# Patient Record
Sex: Female | Born: 1955 | Race: Black or African American | Hispanic: No | Marital: Single | Smoking: Current every day smoker
Health system: Southern US, Community
[De-identification: ages and names within clinical notes are randomized; demographics above are authoritative.]

## PROBLEM LIST (undated history)

## (undated) DIAGNOSIS — M549 Dorsalgia, unspecified: Secondary | ICD-10-CM

## (undated) DIAGNOSIS — M1990 Unspecified osteoarthritis, unspecified site: Secondary | ICD-10-CM

## (undated) DIAGNOSIS — Z9189 Other specified personal risk factors, not elsewhere classified: Secondary | ICD-10-CM

## (undated) DIAGNOSIS — E785 Hyperlipidemia, unspecified: Secondary | ICD-10-CM

## (undated) DIAGNOSIS — G4730 Sleep apnea, unspecified: Secondary | ICD-10-CM

## (undated) DIAGNOSIS — D649 Anemia, unspecified: Secondary | ICD-10-CM

## (undated) DIAGNOSIS — J45909 Unspecified asthma, uncomplicated: Secondary | ICD-10-CM

## (undated) DIAGNOSIS — R51 Headache: Secondary | ICD-10-CM

## (undated) DIAGNOSIS — M797 Fibromyalgia: Secondary | ICD-10-CM

## (undated) DIAGNOSIS — I10 Essential (primary) hypertension: Secondary | ICD-10-CM

## (undated) DIAGNOSIS — R519 Headache, unspecified: Secondary | ICD-10-CM

## (undated) DIAGNOSIS — N390 Urinary tract infection, site not specified: Secondary | ICD-10-CM

## (undated) HISTORY — DX: Unspecified osteoarthritis, unspecified site: M19.90

## (undated) HISTORY — PX: DILATION AND CURETTAGE OF UTERUS: SHX78

## (undated) HISTORY — DX: Fibromyalgia: M79.7

## (undated) HISTORY — PX: COLON SURGERY: SHX602

## (undated) HISTORY — DX: Sleep apnea, unspecified: G47.30

## (undated) HISTORY — PX: CHOLECYSTECTOMY: SHX55

## (undated) HISTORY — DX: Urinary tract infection, site not specified: N39.0

## (undated) HISTORY — DX: Essential (primary) hypertension: I10

## (undated) HISTORY — PX: OVARIAN CYST REMOVAL: SHX89

## (undated) HISTORY — DX: Anemia, unspecified: D64.9

## (undated) HISTORY — DX: Dorsalgia, unspecified: M54.9

---

## 2011-01-29 ENCOUNTER — Ambulatory Visit (INDEPENDENT_AMBULATORY_CARE_PROVIDER_SITE_OTHER): Payer: Self-pay | Admitting: Family Medicine

## 2011-01-29 ENCOUNTER — Encounter: Payer: Self-pay | Admitting: Family Medicine

## 2011-01-29 DIAGNOSIS — E669 Obesity, unspecified: Secondary | ICD-10-CM

## 2011-01-29 DIAGNOSIS — M549 Dorsalgia, unspecified: Secondary | ICD-10-CM

## 2011-01-29 DIAGNOSIS — Z720 Tobacco use: Secondary | ICD-10-CM

## 2011-01-29 DIAGNOSIS — F17200 Nicotine dependence, unspecified, uncomplicated: Secondary | ICD-10-CM

## 2011-01-29 DIAGNOSIS — G479 Sleep disorder, unspecified: Secondary | ICD-10-CM

## 2011-01-29 NOTE — Patient Instructions (Signed)
It was great to see you today! I want you to get some tylenol (also known as acetaminophen) 650mg  tablets.  Take one of them every 6 hours around the clock from now until when I see you next. Get with Rudell Cobb and get yourself approved so we can manage some tests without you ending up with big bills.  Get back to see me after you get approved.

## 2011-02-08 ENCOUNTER — Encounter: Payer: Self-pay | Admitting: Family Medicine

## 2011-02-08 DIAGNOSIS — M549 Dorsalgia, unspecified: Secondary | ICD-10-CM | POA: Insufficient documentation

## 2011-02-08 DIAGNOSIS — G479 Sleep disorder, unspecified: Secondary | ICD-10-CM | POA: Insufficient documentation

## 2011-02-08 DIAGNOSIS — Z720 Tobacco use: Secondary | ICD-10-CM | POA: Insufficient documentation

## 2011-02-08 DIAGNOSIS — E669 Obesity, unspecified: Secondary | ICD-10-CM | POA: Insufficient documentation

## 2011-02-08 NOTE — Assessment & Plan Note (Signed)
I am somewhat unclear as to the exact etiology of this patient's sleep problem. Whenever it was brought up, the conversation quickly turned to problems with her pain or eating. I am not inclined to begin any medication for this issue as I feel that it is most likely environmental. We will have to discuss this at further visits.

## 2011-02-08 NOTE — Progress Notes (Signed)
Patient name: Dawn Lucas Medical record number: [id number] Date of birth: [birth year] Age: 55 y.o. Gender: female  Primary Care Provider: Majel Homer, MD  Chief Complaint: Back and leg pain. History of Present Illness: Dawn Lucas is a 55 y.o. year old female presenting with Multiple complaints.  1: the patient reports having back pain that has been present for many years. It has gradually worsened with time, and now significantly interferes with her ability to function. She reports that the pain is constant, aggravated by standing, also aggravated by sitting for too long. She has not tried many medications for her pain as she does not like Tylenol, and says that Motrin makes her sick. She has tried a friends Flexeril and reports that it makes her crazy. She has also tried a friend Xanax, which has helped her sleep but has not significantly helped the pain. She says that the pain does radiate to her legs somewhat, but has not been associated with any numbness or tingling, and there is no history of bowel or bladder dysfunction.  The patient reports having attempted to apply for disability in a previous state, but was turned down. She moved to [place] approximately one year ago and hopes to apply for disability here as well.  2: additionally, the patient reports significant problems with sleep. Her problems with sleep appear to be intertwined with her pain, however she demonstrates poor sleep hygiene, and a poor sleep pattern. She typically sleeps when tired during the day, and has a hard time going to sleep at night. She has tried Xanax and Tylenol p.m. For her sleep, and feels that the Xanax is helpful. She has not been prescribed Xanax before, but rather has used a friends.  3: finally, the patient admits to having a problem with her weight. She reports that she has been unable to exercise over the past year or two secondary to the pain in her back and legs.  Patient Active Problem List    Diagnoses  . Back pain  . Obese  . Tobacco abuse   Past Medical History: Past Medical History  Diagnosis Date  . Anemia   . UTI (lower urinary tract infection)   . Back pain     Past Surgical History: Past Surgical History  Procedure Date  . Cholecystectomy   . Ovarian cyst removal     Social History: History   Social History  . Marital Status: Single    Spouse Name: N/A    Number of Children: N/A  . Years of Education: N/A   Social History Main Topics  . Smoking status: Current Everyday Smoker -- 0.2 packs/day for 45 years    Types: Cigarettes  . Smokeless tobacco: None  . Alcohol Use: Yes  . Drug Use: No  . Sexually Active: Not Currently   Other Topics Concern  . None   Social History Narrative  . None    Family History: History reviewed. No pertinent family history.  Allergies: Allergies  Allergen Reactions  . Quinolones   . Sulfa Antibiotics     No current outpatient prescriptions on file.   Review Of Systems: Per HPI Otherwise 12 point review of systems was performed and was unremarkable.  Physical Exam: Filed Vitals:   01/29/11 1422  BP: 158/98  Pulse: 87  Temp: 98.5 F (36.9 C)              General: alert, cooperative and moderately obese HEENT: extra ocular movement intact and sclera clear, anicteric Heart:  S1, S2 normal, no murmur, rub or gallop, regular rate and rhythm Lungs: clear to auscultation, no wheezes or rales and unlabored breathing Abdomen: abdomen is soft without significant tenderness, masses, organomegaly or guarding Extremities: extremities normal, atraumatic, no cyanosis or edema Right upper extremity has limited range of motion at the shoulder joint secondary to pain.  All directions of motion are limited but most particularly, abduction and adduction are significantly limited.  There is tenderness in the general low-back and hip region but no point-tenderness.  Exam severely limited secondary to body  habitus. Skin:no rashes, no ecchymoses, no petechiae, no nodules Neurology: normal without focal findings, mental status, speech normal, alert and oriented x3 and PERLA

## 2011-02-08 NOTE — Assessment & Plan Note (Signed)
[name] body habitus puts her at significant risk for multiple medical problems. Unfortunately, due to her insurance status, we are not able to do any screening laboratory tests at this point in time. Provided the patient with encouragement to attempt to exercise as possible, and will plan to draw screening labs at her next visit once finances have been dealt with. Will plan to have periodic discussions about this patient's weight and weight loss options. Will likely bring up the idea of water aerobics as a method of exercising that is less likely to bother her back and knees.

## 2011-02-08 NOTE — Assessment & Plan Note (Addendum)
As the patient currently has no form of insurance, our options are severely limited. For the time being I will encourage the patient to try scheduled Tylenol. Discussed this with the patient at length, and determined that the patient's main objection to Tylenol was that it did not help her sleep. I would like to give the patient a trial on Mobic, however, she does not currently have any way of paying for this. For now our plan will be for her to try to Tylenol, and see Dawn Lucas to get financial information worked out. Once this has been accomplished, she will make an additional appointment to see me and we will discuss further options.  As the patient is not currently experiencing any bowel or bladder symptoms, do not feel that this is an acute issue. I feel it is most likely related to the patient's weight, and unless new or more worrisome symptoms appear I am not inclined to do any imaging.

## 2011-02-12 ENCOUNTER — Ambulatory Visit: Payer: Self-pay | Admitting: Family Medicine

## 2011-02-22 ENCOUNTER — Ambulatory Visit (INDEPENDENT_AMBULATORY_CARE_PROVIDER_SITE_OTHER): Payer: Self-pay | Admitting: Family Medicine

## 2011-02-22 ENCOUNTER — Encounter: Payer: Self-pay | Admitting: Family Medicine

## 2011-02-22 VITALS — BP 154/103 | HR 91 | Temp 98.4°F | Ht 63.0 in | Wt 224.4 lb

## 2011-02-22 DIAGNOSIS — E669 Obesity, unspecified: Secondary | ICD-10-CM

## 2011-02-22 DIAGNOSIS — M549 Dorsalgia, unspecified: Secondary | ICD-10-CM

## 2011-02-22 LAB — COMPREHENSIVE METABOLIC PANEL
ALT: 19 U/L (ref 0–35)
AST: 14 U/L (ref 0–37)
Albumin: 4.7 g/dL (ref 3.5–5.2)
Alkaline Phosphatase: 75 U/L (ref 39–117)
BUN: 14 mg/dL (ref 6–23)
CO2: 19 mEq/L (ref 19–32)
Calcium: 9.9 mg/dL (ref 8.4–10.5)
Chloride: 107 mEq/L (ref 96–112)
Creat: 0.85 mg/dL (ref 0.40–1.20)
Glucose, Bld: 80 mg/dL (ref 70–99)
Potassium: 4.4 mEq/L (ref 3.5–5.3)
Sodium: 140 mEq/L (ref 135–145)
Total Bilirubin: 0.6 mg/dL (ref 0.3–1.2)
Total Protein: 7.7 g/dL (ref 6.0–8.3)

## 2011-02-22 LAB — CBC
HCT: 41.9 % (ref 36.0–46.0)
Hemoglobin: 14.2 g/dL (ref 12.0–15.0)
MCH: 28.9 pg (ref 26.0–34.0)
MCHC: 33.9 g/dL (ref 30.0–36.0)
MCV: 85.3 fL (ref 78.0–100.0)
Platelets: 354 10*3/uL (ref 150–400)
RBC: 4.91 MIL/uL (ref 3.87–5.11)
RDW: 14.3 % (ref 11.5–15.5)
WBC: 9.9 10*3/uL (ref 4.0–10.5)

## 2011-02-22 LAB — TSH: TSH: 2.156 u[IU]/mL (ref 0.350–4.500)

## 2011-02-22 MED ORDER — GABAPENTIN 100 MG PO CAPS: 100.0000 mg | ORAL_CAPSULE | Freq: Two times a day (BID) | ORAL | Status: DC

## 2011-02-22 MED ORDER — GABAPENTIN 100 MG PO CAPS: 100.0000 mg | ORAL_CAPSULE | Freq: Two times a day (BID) | ORAL | Status: AC

## 2011-02-22 NOTE — Patient Instructions (Signed)
It was great to see you today! I want you to give the tylenol a longer try.  Use it every day for at least a week and see if it helps at least a little. I also am prescribing you a medicine called Gabapentin.  You can take one pill in the morning and one in the evening.  Assuming that you are doing all right with that in a week, you are welcome to go up to one or two pills in the morning and one or two pills at night. We will get some blood work today.  We will talk about it at your next appointment. If your blood pressure is still high at your next appointment, we will have to talk about medicine to lower it.

## 2011-02-26 ENCOUNTER — Encounter: Payer: Self-pay | Admitting: Family Medicine

## 2011-02-26 NOTE — Progress Notes (Signed)
Subjective: Pt presents today primarily wanting to talk about her pain.  She reports that she took Tylenol 500mg  Q6H times 3 doses, went to bed, and woke up the following morning feeling very stiff and unable to move.  She did not take any additional Tylenol after that point.  She reports that, in the past, she was prescribed some oxycodone and did not like how it made her feel.  [name] pain is somewhere between achy and burning with occasional shooting pains.  It is located, as previously noted, in her low back and hips with occasional shooting pains down her legs.  She also has significant pain in her right shoulder with limited mobility of this shoulder.  All of these symptoms have been present for many years but have recently been getting worse.  Objective:  Filed Vitals:   02/22/11 1502  BP: 154/103  Pulse: 91  Temp: 98.4 F (36.9 C)   Gen: NAD, alert, cooperative with exam, morbidly obese CV: RRR, no MRG Resp: CTABL, no wheezes noted Abd: SNTND, BS present, no guarding or organomegaly Ext: No edema noted, full ROM Neuro: Alert and oriented, CN II-XII grossly intact

## 2011-02-26 NOTE — Assessment & Plan Note (Signed)
[name] obesity puts her at risk for many disease processes.  As the patient now has an [place] card, will order general screening tests for diabetes, renal dysfunction, liver dysfunction, and thyroid dysfunction.  Discussed weight loss with the patient.  Will discuss further with the patient at a future visit.

## 2011-02-26 NOTE — Assessment & Plan Note (Signed)
Discussed with the patient that I do not feel that there is much of a role of narcotics in chronic pain management and that I am not interested in prescribing them.  Will give the patient a trial on gabapentin to see if it helps.  Due to the patient's worries about side-effects will start with a very low dose and gradually titrate up the dose.  Discussed with the patient that chronic pain management is about small improvements not a magic pill and that weight loss is going to be her absolutely best option for decreasing her pain.

## 2011-03-12 ENCOUNTER — Ambulatory Visit (INDEPENDENT_AMBULATORY_CARE_PROVIDER_SITE_OTHER): Payer: Self-pay | Admitting: Family Medicine

## 2011-03-12 ENCOUNTER — Encounter: Payer: Self-pay | Admitting: Family Medicine

## 2011-03-12 VITALS — BP 137/89 | HR 93 | Temp 98.0°F | Ht 64.0 in | Wt 222.0 lb

## 2011-03-12 DIAGNOSIS — M549 Dorsalgia, unspecified: Secondary | ICD-10-CM

## 2011-03-18 ENCOUNTER — Encounter: Payer: Self-pay | Admitting: Family Medicine

## 2011-03-18 NOTE — Assessment & Plan Note (Signed)
Pt's pain is slightly improved with tylenol but has not filled neurontin prescription.  Discussed with the patient that being able to fill prescriptions is relatively important for trying to treat her pain and make her able to do more physical activity.  Pt did express some understanding of this.  Also discussed continued weight loss with the patient.  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  Talked with the patient some about other options, but patient is relatively resistant to other ideas.  Will have the patient follow up once she has come up with a way of getting medication prescriptions filled, or sooner if needed.

## 2011-03-18 NOTE — Patient Instructions (Signed)
It was good to see you today. Continue to try to exercise and loose weight. Once you get in to the medication assistance program, we can talk more about medications for your sleep and pain.

## 2011-03-18 NOTE — Progress Notes (Signed)
Subjective: Pt presents today for followup of back/arm pain.  Since last visit has been taking tylenol 500mg  q6h and reports slight relief of symptoms.  Pt has not filled gabapentin prescription due to need to be enrolled in medication assistance program.  She has not enrolled because this requires a noterized letter, which she does not want to ask her daughter to pay for.  She does have plans of asking her daughter to pay for filling her neurontin prescription out of pocket.  Otherwise, pt still reports problems sleeping, mainly sleeping during the day and staying awake at night, and continued pain and decreased ability to walk secondary to pain.  No acute changes since previous visit.  Objective:  Filed Vitals:   03/12/11 1607  BP: 137/89  Pulse: 93  Temp: 98 F (36.7 C)   Gen: NAD, morbidly obese, alert, cooperative with exam CV: RRR, no MRG Resp: CTABL, no wheezes noted Ext: Limited ROM of upper extremities, as previously noted, 2+ strength and preserved reflexes

## 2011-06-13 ENCOUNTER — Encounter: Payer: Self-pay | Admitting: Family Medicine

## 2011-07-04 ENCOUNTER — Ambulatory Visit (HOSPITAL_COMMUNITY)
Admission: RE | Admit: 2011-07-04 | Discharge: 2011-07-04 | Disposition: A | Discharge status: Home / Self Care | Payer: Self-pay | Source: Ambulatory Visit | Attending: Family Medicine | Admitting: Family Medicine

## 2011-07-04 ENCOUNTER — Telehealth: Payer: Self-pay | Admitting: Family Medicine

## 2011-07-04 ENCOUNTER — Ambulatory Visit (INDEPENDENT_AMBULATORY_CARE_PROVIDER_SITE_OTHER): Payer: Self-pay | Admitting: Family Medicine

## 2011-07-04 ENCOUNTER — Encounter: Payer: Self-pay | Admitting: Family Medicine

## 2011-07-04 DIAGNOSIS — M79601 Pain in right arm: Secondary | ICD-10-CM

## 2011-07-04 DIAGNOSIS — M47817 Spondylosis without myelopathy or radiculopathy, lumbosacral region: Secondary | ICD-10-CM | POA: Insufficient documentation

## 2011-07-04 DIAGNOSIS — M545 Low back pain, unspecified: Secondary | ICD-10-CM

## 2011-07-04 DIAGNOSIS — M5030 Other cervical disc degeneration, unspecified cervical region: Secondary | ICD-10-CM | POA: Insufficient documentation

## 2011-07-04 DIAGNOSIS — M79604 Pain in right leg: Secondary | ICD-10-CM

## 2011-07-04 DIAGNOSIS — M79609 Pain in unspecified limb: Secondary | ICD-10-CM

## 2011-07-04 MED ORDER — METHOCARBAMOL 500 MG PO TABS: 500.0000 mg | ORAL_TABLET | Freq: Four times a day (QID) | ORAL | Status: AC | PRN

## 2011-07-04 MED ORDER — PREDNISONE 10 MG PO KIT: PACK | ORAL | Status: DC

## 2011-07-04 NOTE — Patient Instructions (Signed)
It was good to see you today! I am giving you a prednisone taper to help decrease the inflammation in your back and arms. I am also giving you a prescription for methocarbamol, a muscle relaxer.  You can take it up to 4 times daily, but to begin with, take it at night to try to help you sleep some. We will be getting some x-rays of your neck and back. Come back to see me in 10-14 days.

## 2011-07-04 NOTE — Progress Notes (Signed)
Subjective: Pt returns complaining of right arm pain, back pain, and right leg pain.  Pain has been going on for 6 years but has gotten worse over the last year.  No recent trauma.  No inciting events.  Pain is only on the right side.  Some weakness in right hand. The patient does note some shooting pains that radiate both up and down her right arm. The pain is in her back originates in the low back and radiates down her leg and somewhat into her groin. She does not note a shooting sensation with this pain. It is somewhat more generalized than her problems with her right arm. She does not have any associated urinary or fecal incontinence.  For last week has been having problems with central chest pain.  Pain is intermittent, not related to exertion, and lasts for approximately one hour when it is present.  She feels it is primarily related to what she eats and the amount of pain she is in elsewhere.  Pt has stopped taking gabapentin.  She took it as prescribed for three days, then threw up one time and stopped taking it.  Objective:  Filed Vitals:   07/04/11 1336  BP: 128/88  Temp: 99 F (37.2 C)   Gen: No immediate distress CV: Regular rate and rhythm Resp: Clear to auscultation bilaterally Abd: Soft, nontender, nondistended Extremities: No significant weakness of her right hand or leg. The patient does have limited range of motion in the right shoulder secondary to pain. There are no bony deformities. There is no pain on palpation of the right hip. Pain is elicited at the extremes of range of motion of both feet right shoulder and hip.  Assessment/Plan:  Please also see individual problems in problem list for problem-specific plans.

## 2011-07-04 NOTE — Telephone Encounter (Signed)
Needs clarification on the dosage for Prednisone.

## 2011-07-05 NOTE — Telephone Encounter (Signed)
Called and spoke with Telly at [facility] and she wanted to know which Prednisone you wanted Ms. Rody to have. She stated that it came in 6 day and 10 day,I told her to fill it for the 12 day.Loralee Pacas [place]

## 2011-07-09 ENCOUNTER — Encounter: Payer: Self-pay | Admitting: Family Medicine

## 2011-07-12 DIAGNOSIS — M79604 Pain in right leg: Secondary | ICD-10-CM | POA: Insufficient documentation

## 2011-07-12 DIAGNOSIS — M545 Low back pain, unspecified: Secondary | ICD-10-CM | POA: Insufficient documentation

## 2011-07-12 DIAGNOSIS — M79601 Pain in right arm: Secondary | ICD-10-CM | POA: Insufficient documentation

## 2011-07-12 NOTE — Assessment & Plan Note (Signed)
[name] pain does appear to be neuropathic in origin. After discussion with preceptor, are somewhat worried about the possibility of nerve root impingement. Accordingly will obtain a plain film of the cervical spine and treat with a prednisone burst plus a muscle relaxant.

## 2011-07-12 NOTE — Assessment & Plan Note (Signed)
As with the patient's arm pain, I am concerned about the possibility of I nerve root impingement. After discussion with preceptor decided to give the patient a course of steroids, muscle relaxants, and obtain some basic plain films of her lower spine and hip.

## 2011-07-18 ENCOUNTER — Encounter: Payer: Self-pay | Admitting: Family Medicine

## 2011-07-18 ENCOUNTER — Ambulatory Visit (INDEPENDENT_AMBULATORY_CARE_PROVIDER_SITE_OTHER): Payer: Self-pay | Admitting: Family Medicine

## 2011-07-18 VITALS — BP 138/85 | HR 74 | Temp 97.9°F | Ht 64.0 in | Wt 218.0 lb

## 2011-07-18 DIAGNOSIS — M79609 Pain in unspecified limb: Secondary | ICD-10-CM

## 2011-07-18 DIAGNOSIS — M79604 Pain in right leg: Secondary | ICD-10-CM

## 2011-07-18 DIAGNOSIS — M545 Low back pain, unspecified: Secondary | ICD-10-CM

## 2011-07-18 DIAGNOSIS — Z1231 Encounter for screening mammogram for malignant neoplasm of breast: Secondary | ICD-10-CM

## 2011-07-18 DIAGNOSIS — M79601 Pain in right arm: Secondary | ICD-10-CM

## 2011-07-18 DIAGNOSIS — Z23 Encounter for immunization: Secondary | ICD-10-CM

## 2011-07-18 NOTE — Assessment & Plan Note (Signed)
X-rays don't show much to explain the patient's pain. It does appear to be responding to steroids. I would not be surprised what there is some form of nerve impingement in the cervical spine. Discussed the idea of surgery with the patient, and she is not terribly keen on this idea at this point in time. Accordingly we will hold off on an MRI unless symptoms worsen. I will put in a referral to physical therapy at this time, and generally encourage ambulation and activity.

## 2011-07-18 NOTE — Progress Notes (Signed)
Subjective: Pt started prednisone taper two days ago and is reporting significant imiprovement in pain and function.  Is able to walk.  Still has some numbness and shooting pains in right arm and occasional twinges in her lower back. She does not have any problems with urinary or bowel symptoms. She reports being able to walk several blocks to the store without significant back pain.  Has some urinary frequency but this is a longstanding issue.  Objective:  Filed Vitals:   07/18/11 1333  BP: 138/85  Pulse: 74  Temp: 97.9 F (36.6 C)   Gen: No acute distress, significantly overweight CV: Regular rate and rhythm Resp: Clear to auscultation bilaterally, prolonged expiratory phase Abd: Soft, nontender, nondistended Extremities: 5+ strength in all extremities.  Assessment/Plan: X-rays don't show much to explain the patient's pain. It does appear to be responding to steroids. I would not be surprised what there is some form of nerve impingement in the cervical spine. Discussed the idea of surgery with the patient, and she is not terribly keen on this idea at this point in time. Accordingly we will hold off on an MRI unless symptoms worsen. I will put in a referral to physical therapy at this time, and generally encourage ambulation and activity.  Discussed mammogram with the patient. She agreed to get one done.  Please also see individual problems in problem list for problem-specific plans.

## 2011-07-18 NOTE — Patient Instructions (Signed)
It was great seeing you today! You will be contacted about outpatient physical therapy once we get that set up. Please get your mammogram. Plan on touching base in about a month to see how you're doing.

## 2011-08-06 ENCOUNTER — Ambulatory Visit (HOSPITAL_COMMUNITY)
Admission: RE | Admit: 2011-08-06 | Discharge: 2011-08-06 | Disposition: A | Discharge status: Home / Self Care | Payer: Self-pay | Source: Ambulatory Visit | Attending: Family Medicine | Admitting: Family Medicine

## 2011-08-06 DIAGNOSIS — Z1231 Encounter for screening mammogram for malignant neoplasm of breast: Secondary | ICD-10-CM | POA: Insufficient documentation

## 2011-08-07 ENCOUNTER — Ambulatory Visit: Payer: Self-pay | Attending: Family Medicine | Admitting: Physical Therapy

## 2011-08-07 DIAGNOSIS — M2550 Pain in unspecified joint: Secondary | ICD-10-CM | POA: Insufficient documentation

## 2011-08-07 DIAGNOSIS — R293 Abnormal posture: Secondary | ICD-10-CM | POA: Insufficient documentation

## 2011-08-07 DIAGNOSIS — IMO0001 Reserved for inherently not codable concepts without codable children: Secondary | ICD-10-CM | POA: Insufficient documentation

## 2011-08-07 DIAGNOSIS — M2560 Stiffness of unspecified joint, not elsewhere classified: Secondary | ICD-10-CM | POA: Insufficient documentation

## 2011-08-07 DIAGNOSIS — R5381 Other malaise: Secondary | ICD-10-CM | POA: Insufficient documentation

## 2011-08-13 ENCOUNTER — Other Ambulatory Visit: Payer: Self-pay | Admitting: Family Medicine

## 2011-08-13 DIAGNOSIS — R928 Other abnormal and inconclusive findings on diagnostic imaging of breast: Secondary | ICD-10-CM

## 2011-08-14 ENCOUNTER — Encounter: Payer: Self-pay | Admitting: Physical Therapy

## 2011-08-20 ENCOUNTER — Ambulatory Visit: Payer: Self-pay | Attending: Family Medicine | Admitting: Rehabilitation

## 2011-08-20 DIAGNOSIS — R293 Abnormal posture: Secondary | ICD-10-CM | POA: Insufficient documentation

## 2011-08-20 DIAGNOSIS — M2560 Stiffness of unspecified joint, not elsewhere classified: Secondary | ICD-10-CM | POA: Insufficient documentation

## 2011-08-20 DIAGNOSIS — IMO0001 Reserved for inherently not codable concepts without codable children: Secondary | ICD-10-CM | POA: Insufficient documentation

## 2011-08-20 DIAGNOSIS — R5381 Other malaise: Secondary | ICD-10-CM | POA: Insufficient documentation

## 2011-08-20 DIAGNOSIS — M2550 Pain in unspecified joint: Secondary | ICD-10-CM | POA: Insufficient documentation

## 2011-08-22 ENCOUNTER — Encounter: Payer: Self-pay | Admitting: Family Medicine

## 2011-08-22 ENCOUNTER — Ambulatory Visit: Payer: Self-pay | Admitting: Rehabilitation

## 2011-08-22 ENCOUNTER — Ambulatory Visit (INDEPENDENT_AMBULATORY_CARE_PROVIDER_SITE_OTHER): Payer: Self-pay | Admitting: Family Medicine

## 2011-08-22 VITALS — BP 133/88 | HR 84 | Temp 99.0°F | Ht 64.0 in | Wt 218.0 lb

## 2011-08-22 DIAGNOSIS — M549 Dorsalgia, unspecified: Secondary | ICD-10-CM

## 2011-08-22 DIAGNOSIS — M79601 Pain in right arm: Secondary | ICD-10-CM

## 2011-08-22 DIAGNOSIS — M79609 Pain in unspecified limb: Secondary | ICD-10-CM

## 2011-08-22 DIAGNOSIS — M545 Low back pain, unspecified: Secondary | ICD-10-CM

## 2011-08-22 DIAGNOSIS — M79604 Pain in right leg: Secondary | ICD-10-CM

## 2011-08-22 DIAGNOSIS — Z23 Encounter for immunization: Secondary | ICD-10-CM

## 2011-08-22 NOTE — Progress Notes (Signed)
Subjective: Has been to PT two times thus far.  Seems to be helping.  Continues to have some pain in the right shoulder and right hip.  Has seen some improvement, especially in the sacaral/iliac joint area.  Has noticed some ankle swelling since the prednisone.    Has put in some job applications.  Finances continue to be a significant issue for her.  Reports some problems with bad headaches.  These start from back of neck and spread up bitemporally.  No changes in vision but does have some n/v.  Has had lots of problems with headaches in the past and this is no different.  Objective:  Filed Vitals:   08/22/11 1344  BP: 133/88  Pulse: 84  Temp: 99 F (37.2 C)   Gen: Obese female in no acute distress HEENT: Patient continues to have some limited range of motion of the right shoulder and neck due to pain, significant muscle tightness. CV: Regular rate and rhythm Resp: Clear to auscultation bilaterally Extremities:2+ pulses, no edema  Assessment/Plan:  Please also see individual problems in problem list for problem-specific plans.

## 2011-08-22 NOTE — Patient Instructions (Addendum)
It was great to see you today! I'm glad the physical therapy seems to be helping so much.  I would encourage you to keep on going as much as you can. I would recommend trying some tylenol for your stiffness when you can afford it. We will get you a tetanus shot today. Come back to see me some time after the holidays.  We can talk about any other blood tests we should get then.

## 2011-08-22 NOTE — Assessment & Plan Note (Signed)
Improving with physical therapy. Recommend continuing this. I will not perform any other interventions due to the patient's financial limitations.

## 2011-08-22 NOTE — Assessment & Plan Note (Signed)
See back pain assessment

## 2011-08-26 ENCOUNTER — Encounter: Payer: Self-pay | Admitting: Rehabilitation

## 2011-08-27 ENCOUNTER — Other Ambulatory Visit: Payer: Self-pay

## 2011-08-28 ENCOUNTER — Ambulatory Visit: Payer: Self-pay | Admitting: Physical Therapy

## 2011-09-03 ENCOUNTER — Encounter: Payer: Self-pay | Admitting: Rehabilitation

## 2011-09-05 ENCOUNTER — Encounter: Payer: Self-pay | Admitting: Rehabilitation

## 2011-09-18 ENCOUNTER — Telehealth: Payer: Self-pay | Admitting: Family Medicine

## 2011-09-18 DIAGNOSIS — M545 Low back pain, unspecified: Secondary | ICD-10-CM

## 2011-09-18 DIAGNOSIS — M79604 Pain in right leg: Secondary | ICD-10-CM

## 2011-09-18 NOTE — Telephone Encounter (Signed)
Need referral sent to outpt rehab for continuing pt

## 2011-09-27 ENCOUNTER — Ambulatory Visit (INDEPENDENT_AMBULATORY_CARE_PROVIDER_SITE_OTHER): Payer: Self-pay | Admitting: Family Medicine

## 2011-09-27 VITALS — BP 138/96 | HR 114

## 2011-09-27 DIAGNOSIS — R42 Dizziness and giddiness: Secondary | ICD-10-CM

## 2011-09-27 LAB — BASIC METABOLIC PANEL
BUN: 12 mg/dL (ref 6–23)
CO2: 18 mEq/L — ABNORMAL LOW (ref 19–32)
Calcium: 9.7 mg/dL (ref 8.4–10.5)
Chloride: 110 mEq/L (ref 96–112)
Creat: 0.89 mg/dL (ref 0.50–1.10)
Glucose, Bld: 100 mg/dL — ABNORMAL HIGH (ref 70–99)
Potassium: 4.4 mEq/L (ref 3.5–5.3)
Sodium: 142 mEq/L (ref 135–145)

## 2011-09-27 LAB — CBC
HCT: 41.9 % (ref 36.0–46.0)
Hemoglobin: 13.8 g/dL (ref 12.0–15.0)
MCH: 28.8 pg (ref 26.0–34.0)
MCHC: 32.9 g/dL (ref 30.0–36.0)
MCV: 87.3 fL (ref 78.0–100.0)
Platelets: 358 10*3/uL (ref 150–400)
RBC: 4.8 MIL/uL (ref 3.87–5.11)
RDW: 14.4 % (ref 11.5–15.5)
WBC: 7.5 10*3/uL (ref 4.0–10.5)

## 2011-09-27 LAB — TSH: TSH: 2.636 u[IU]/mL (ref 0.350–4.500)

## 2011-09-27 NOTE — Assessment & Plan Note (Addendum)
Likely BPPV, self resolving.  History or exam does not suggest neuro cardiogenic source.  Will check CBC, TSH, CMET.  Advised decreasing high caffeine use, increasing fluid intake, and continuing to monitor symptoms.  Not a good candidate for repositioning maneuvers or PT - discussed this option with patient- she declined.  Will follow-up with PCP

## 2011-09-27 NOTE — Patient Instructions (Signed)
Drink lots of water, avoid too much coffee  Get up slowly  Make follow- up with Dr. Louanne Belton

## 2011-09-27 NOTE — Progress Notes (Signed)
  Subjective:    Patient ID: Dawn Lucas, female    DOB: 1956-09-09, 55 y.o.   MRN: [id number]  HPI 4 days of dizziness and "off balance"  Poor historian.  Feels dizziness come on when she rolls over in her bed or gets up from bed to go to the bathroom.  Subsides quickly.  Does not feel it when still.  No syncope or presyncope. No falls.  States it has been getting better in the past day.  Has not taken any medications for this.   Has not had this occur before.  States she is her usual state of health and denies any recent fever or URI.    Review of SystemsGen:  No fever, chills, unexplained weight loss Ears:  No hearing loss, ringing Eyes: No vision changes, double vision, eye drainage Nose:  No rhinorrhea, congestion Throat:  No sore throat or dysphagia CV:  No chest pain, palpitations, PND, dyspnea on exertion, or edema Resp: No cough, dyspnea, wheezing Abd: No nausea, vomting, diarrhea, constipation, or change in bowel color, size, or caliber. MSK: no joint pain, myalgias SKIN: no rash, changing moles Neuro:  No headache, syncope.  Chronic numbness and tingling in shoulders and arms.       Objective:   Physical Exam  GEN: Alert & Oriented, No acute distress CV:  Regular Rate & Rhythm, no murmur Respiratory:  Normal work of breathing, CTAB Abd:  + BS, soft, no tenderness to palpation Ext: no pre-tibial edema Neuro:  CN 2-12 grossly intact.  Neg pronator drift and finger to nose normal.  Strength 5/5 upper and lower extremities.  She declines attempting dix-hallpike.       Assessment & Plan:

## 2011-10-03 ENCOUNTER — Ambulatory Visit
Admission: RE | Admit: 2011-10-03 | Discharge: 2011-10-03 | Disposition: A | Discharge status: Home / Self Care | Payer: Self-pay | Source: Ambulatory Visit | Attending: Family Medicine | Admitting: Family Medicine

## 2011-10-03 DIAGNOSIS — R928 Other abnormal and inconclusive findings on diagnostic imaging of breast: Secondary | ICD-10-CM

## 2011-10-04 ENCOUNTER — Encounter: Payer: Self-pay | Admitting: Physical Therapy

## 2011-10-15 ENCOUNTER — Ambulatory Visit (INDEPENDENT_AMBULATORY_CARE_PROVIDER_SITE_OTHER): Payer: Self-pay | Admitting: Family Medicine

## 2011-10-15 ENCOUNTER — Encounter: Payer: Self-pay | Admitting: Family Medicine

## 2011-10-15 VITALS — BP 153/97 | HR 96 | Temp 97.9°F | Ht 64.0 in | Wt 214.3 lb

## 2011-10-15 DIAGNOSIS — M542 Cervicalgia: Secondary | ICD-10-CM

## 2011-10-15 DIAGNOSIS — R52 Pain, unspecified: Secondary | ICD-10-CM | POA: Insufficient documentation

## 2011-10-15 DIAGNOSIS — M79601 Pain in right arm: Secondary | ICD-10-CM

## 2011-10-15 DIAGNOSIS — G8929 Other chronic pain: Secondary | ICD-10-CM

## 2011-10-15 DIAGNOSIS — M79609 Pain in unspecified limb: Secondary | ICD-10-CM

## 2011-10-15 MED ORDER — AMITRIPTYLINE HCL 25 MG PO TABS: 25.0000 mg | ORAL_TABLET | Freq: Every day | ORAL | Status: DC

## 2011-10-15 NOTE — Progress Notes (Signed)
Subjective: The patient is a 57 y.o. year old female who presents today for back, neck, arm pain.  Also right hip locking.  (Gets so stiff does not move)  Pain starts from neck and radiates down arms.  Some numbness.  Is also having some problems with headaches.  The patient reports that she is having some problems with weakness in her right hand.  She has been to physical therapy several times and has found it to be at least mildly useful.  She requires an additional referral to continue seeing them.  The patient denies any chest pain, shortness of breath, syncope, bowel or bladder incontinence, or saddle anesthesia.  She continues to have some problems with numbness in both hands and her feet.  She has not tolerated either a muscle relaxer or gabapentin in the past.  Objective:  Filed Vitals:   10/15/11 1432  BP: 153/97  Pulse: 96  Temp: 97.9 F (36.6 C)   Gen: obese female, no acute distress HEENT: there is significant muscle spasm bilaterally in the shoulders and neck.  Range of motion is somewhat limited due to pain.  There were no bony abnormalities. CV: regular rate and rhythm Resp: clear to auscultation bilaterally Ext: mildly decreased grip strength in the right-hand (4+ strength).  There is decreased sensation bilaterally in the upper extremities.  Assessment/Plan: Chronic neck pain with signs and symptoms concerning for nerve compression.  I will obtain an MRI at this time and recommend the patient continued physical therapy.  I will also give the patient a trial of amitriptyline to help with neuropathic pain.  I am not overly optimistic that the patient will tolerate this as she has failed to tolerate all medicines today, including Tylenol.  I will have the patient return to clinic approximately one week following her MRI.  The patient was informed of signs and symptoms that should prompt a more immediate return.  Please also see individual problems in problem list for problem-specific  plans.

## 2011-10-15 NOTE — Patient Instructions (Signed)
It was good to see you today. I want you to get an MRI of your neck. Come back to see me approximately one week after this is completed so we can talk about the results. I want you to start taking the medicine amitriptyline one time a day. I am hoping this will reduce your pain somewhat.

## 2011-10-17 NOTE — Assessment & Plan Note (Signed)
Chronic neck pain with signs and symptoms concerning for nerve compression.  I will obtain an MRI at this time and recommend the patient continued physical therapy.  I will also give the patient a trial of amitriptyline to help with neuropathic pain.  I am not overly optimistic that the patient will tolerate this as she has failed to tolerate all medicines today, including Tylenol.  I will have the patient return to clinic approximately one week following her MRI.  The patient was informed of signs and symptoms that should prompt a more immediate return.

## 2011-10-17 NOTE — Assessment & Plan Note (Signed)
Chronic neck pain with signs and symptoms concerning for nerve compression.  I will obtain an MRI at this time and recommend the patient continued physical therapy.  I will also give the patient a trial of amitriptyline to help with neuropathic pain.  I am not overly optimistic that the patient will tolerate this as she has failed to tolerate all medicines today, including Tylenol.  I will have the patient return to clinic approximately one week following her MRI.  The patient was informed of signs and symptoms that should prompt a more immediate return. 

## 2011-10-18 ENCOUNTER — Ambulatory Visit (HOSPITAL_COMMUNITY)
Admission: RE | Admit: 2011-10-18 | Discharge: 2011-10-18 | Disposition: A | Discharge status: Home / Self Care | Payer: Self-pay | Source: Ambulatory Visit | Attending: Family Medicine | Admitting: Family Medicine

## 2011-10-18 DIAGNOSIS — M5030 Other cervical disc degeneration, unspecified cervical region: Secondary | ICD-10-CM | POA: Insufficient documentation

## 2011-10-18 DIAGNOSIS — G8929 Other chronic pain: Secondary | ICD-10-CM

## 2011-10-22 ENCOUNTER — Encounter: Payer: Self-pay | Admitting: Family Medicine

## 2011-10-23 ENCOUNTER — Ambulatory Visit: Payer: Self-pay | Attending: Family Medicine | Admitting: Physical Therapy

## 2011-10-23 DIAGNOSIS — M2550 Pain in unspecified joint: Secondary | ICD-10-CM | POA: Insufficient documentation

## 2011-10-23 DIAGNOSIS — M2560 Stiffness of unspecified joint, not elsewhere classified: Secondary | ICD-10-CM | POA: Insufficient documentation

## 2011-10-23 DIAGNOSIS — R293 Abnormal posture: Secondary | ICD-10-CM | POA: Insufficient documentation

## 2011-10-23 DIAGNOSIS — R5381 Other malaise: Secondary | ICD-10-CM | POA: Insufficient documentation

## 2011-10-23 DIAGNOSIS — IMO0001 Reserved for inherently not codable concepts without codable children: Secondary | ICD-10-CM | POA: Insufficient documentation

## 2011-10-28 ENCOUNTER — Encounter: Payer: Self-pay | Admitting: Physical Therapy

## 2011-10-31 ENCOUNTER — Encounter: Payer: Self-pay | Admitting: Family Medicine

## 2011-10-31 ENCOUNTER — Ambulatory Visit (INDEPENDENT_AMBULATORY_CARE_PROVIDER_SITE_OTHER): Payer: Self-pay | Admitting: Family Medicine

## 2011-10-31 ENCOUNTER — Ambulatory Visit: Payer: Self-pay | Admitting: Physical Therapy

## 2011-10-31 DIAGNOSIS — M79601 Pain in right arm: Secondary | ICD-10-CM

## 2011-10-31 DIAGNOSIS — M549 Dorsalgia, unspecified: Secondary | ICD-10-CM

## 2011-10-31 DIAGNOSIS — M79609 Pain in unspecified limb: Secondary | ICD-10-CM

## 2011-10-31 DIAGNOSIS — G8929 Other chronic pain: Secondary | ICD-10-CM

## 2011-10-31 DIAGNOSIS — M542 Cervicalgia: Secondary | ICD-10-CM

## 2011-10-31 DIAGNOSIS — I10 Essential (primary) hypertension: Secondary | ICD-10-CM

## 2011-10-31 MED ORDER — MELOXICAM 15 MG PO TABS: 15.0000 mg | ORAL_TABLET | Freq: Every day | ORAL | Status: DC

## 2011-10-31 MED ORDER — AMITRIPTYLINE HCL 25 MG PO TABS: 25.0000 mg | ORAL_TABLET | Freq: Every day | ORAL | Status: DC

## 2011-10-31 NOTE — Patient Instructions (Addendum)
I want you to limit your sodium intake to less than 2000mg  every day.  This will help your blood pressure. Eat frozen or fresh vegetables. I want you to continue taking the amitriptyline.  I will send in a new prescription. I am sending in a prescription for Mobic, a medicine you take once daily for some of your muscle pain.

## 2011-11-04 ENCOUNTER — Ambulatory Visit: Payer: Self-pay | Admitting: Physical Therapy

## 2011-11-04 ENCOUNTER — Encounter: Payer: Self-pay | Admitting: Family Medicine

## 2011-11-04 DIAGNOSIS — I10 Essential (primary) hypertension: Secondary | ICD-10-CM | POA: Insufficient documentation

## 2011-11-04 HISTORY — DX: Essential (primary) hypertension: I10

## 2011-11-04 NOTE — Assessment & Plan Note (Signed)
Improved.  Cont amitriptyline, start low dose mobic for msk component.  Rec continuing PT as tolerated.

## 2011-11-04 NOTE — Assessment & Plan Note (Signed)
Diet/exercise for now.  Continue discussions about low dose antihypertensive at next visit.

## 2011-11-04 NOTE — Progress Notes (Signed)
Subjective: The patient is a 56 y.o. year old female who presents today for follow up.  Has been going to PT.  Has some problems with shortness of breath when placed on her back and her legs are hyper-flexed (pushing her panice into her thoracic cavity).  Otherwise, seems to be going well.  Pt is no longer having problems with numbness/tingling in hands.  Still has problems with muscle pain/aching.  Pt bp noted to be elevated on past 3 visits.  Does not like the idea of taking medication for this.  Objective:  Filed Vitals:   10/31/11 1531  BP: 146/86  Pulse: 103  Temp: 98.6 F (37 C)   Gen: Obese female, NAD Back: Significant muscle tenderness, primarily on the right, in the trapezius muscle and in the paraspinous muscles.  No point tenderness. CV: RRR Resp: CTABL  Assessment/Plan:  Please also see individual problems in problem list for problem-specific plans.

## 2011-11-04 NOTE — Assessment & Plan Note (Signed)
Mildly  

## 2011-11-07 ENCOUNTER — Ambulatory Visit: Payer: Self-pay | Admitting: Physical Therapy

## 2011-11-19 ENCOUNTER — Ambulatory Visit: Payer: Self-pay | Admitting: Family Medicine

## 2011-11-19 ENCOUNTER — Encounter: Payer: Self-pay | Admitting: Physical Therapy

## 2011-11-21 ENCOUNTER — Encounter: Payer: Self-pay | Admitting: Physical Therapy

## 2011-11-27 ENCOUNTER — Ambulatory Visit: Payer: Self-pay | Attending: Family Medicine | Admitting: Rehabilitative and Restorative Service Providers"

## 2011-11-27 DIAGNOSIS — R5381 Other malaise: Secondary | ICD-10-CM | POA: Insufficient documentation

## 2011-11-27 DIAGNOSIS — M2560 Stiffness of unspecified joint, not elsewhere classified: Secondary | ICD-10-CM | POA: Insufficient documentation

## 2011-11-27 DIAGNOSIS — R293 Abnormal posture: Secondary | ICD-10-CM | POA: Insufficient documentation

## 2011-11-27 DIAGNOSIS — M2550 Pain in unspecified joint: Secondary | ICD-10-CM | POA: Insufficient documentation

## 2011-11-27 DIAGNOSIS — IMO0001 Reserved for inherently not codable concepts without codable children: Secondary | ICD-10-CM | POA: Insufficient documentation

## 2011-12-02 ENCOUNTER — Ambulatory Visit: Payer: Self-pay | Admitting: Physical Therapy

## 2011-12-04 ENCOUNTER — Ambulatory Visit: Payer: Self-pay | Admitting: Physical Therapy

## 2011-12-09 ENCOUNTER — Encounter: Payer: Self-pay | Admitting: Family Medicine

## 2011-12-09 ENCOUNTER — Ambulatory Visit (INDEPENDENT_AMBULATORY_CARE_PROVIDER_SITE_OTHER): Payer: Self-pay | Admitting: Family Medicine

## 2011-12-09 VITALS — BP 145/92 | HR 65 | Ht 64.0 in | Wt 218.4 lb

## 2011-12-09 DIAGNOSIS — M79601 Pain in right arm: Secondary | ICD-10-CM

## 2011-12-09 DIAGNOSIS — M549 Dorsalgia, unspecified: Secondary | ICD-10-CM

## 2011-12-09 DIAGNOSIS — Z720 Tobacco use: Secondary | ICD-10-CM

## 2011-12-09 DIAGNOSIS — F17200 Nicotine dependence, unspecified, uncomplicated: Secondary | ICD-10-CM

## 2011-12-09 DIAGNOSIS — I10 Essential (primary) hypertension: Secondary | ICD-10-CM

## 2011-12-09 DIAGNOSIS — M79609 Pain in unspecified limb: Secondary | ICD-10-CM

## 2011-12-09 MED ORDER — AMITRIPTYLINE HCL 25 MG PO TABS: 25.0000 mg | ORAL_TABLET | Freq: Every day | ORAL | Status: DC

## 2011-12-09 MED ORDER — HYDROCHLOROTHIAZIDE 12.5 MG PO TABS: 12.5000 mg | ORAL_TABLET | Freq: Every day | ORAL | Status: DC

## 2011-12-09 NOTE — Assessment & Plan Note (Signed)
Significantly better with elavil.  Will continue this med.

## 2011-12-09 NOTE — Progress Notes (Signed)
Subjective: The patient is a 56 y.o. year old female who presents today for f/u.  Pt reports that Mobic makes her dizzy and "out of it."  Pt reports lying in bed and having sensation of room spinning some of the time.  This happens intermittently with no obvious inciting factors.  Has not improved significantly since stopping Mobic but patient is convinced that Mobic was causing it.  No weakness/headaches/discordination/incontinence.  Feet swelling: seems to having some problems with feet feeling "tight"  Happens intermittently.  Can't point to anything that makes happen.  No rashes/warmth/pain.  Back/neck pain: better with physical therapy.  Continues to use Elavil for hand/neck pain to good effect.  No bowel/bladder problems, no weakness.  Continues to smoke, not interested in quitting.  Objective:  Filed Vitals:   12/09/11 1015  BP: 145/92  Pulse: 65   Gen: NAD, obese HEENT: PERRL, EOMI, neck supple, full strength CV: RRR Resp: CTABL Back: Muscle tightness throughout but no focal findings. Ext: No current LE edema  Assessment/Plan:  Please also see individual problems in problem list for problem-specific plans.

## 2011-12-09 NOTE — Assessment & Plan Note (Signed)
Start low dose HCTZ.  Ref to nutrition for further help with dietary modification.  Hopefully will also help with foot swelling.

## 2011-12-09 NOTE — Assessment & Plan Note (Signed)
Not interested in quitting at this time

## 2011-12-09 NOTE — Patient Instructions (Addendum)
It was good to see you today! I want you to start taking a low dose of a blood pressure medication.  I want you to come back to see me in 2-3 weeks for a blood pressure check. I have sent you in a refill on your Elavil. We can stop the Mobic and see if that helps your dizziness. You will need to call Dr. Gerilyn Pilgrim (our nutritionist) to set up an appointment.

## 2011-12-09 NOTE — Assessment & Plan Note (Signed)
Improving with therapy.  Will rec she continue this as possible.  Also recommend weight reduction

## 2011-12-10 ENCOUNTER — Encounter: Payer: Self-pay | Admitting: Physical Therapy

## 2011-12-11 ENCOUNTER — Encounter: Payer: Self-pay | Admitting: Physical Therapy

## 2011-12-17 ENCOUNTER — Ambulatory Visit: Payer: Self-pay | Attending: Family Medicine | Admitting: Physical Therapy

## 2011-12-17 DIAGNOSIS — IMO0001 Reserved for inherently not codable concepts without codable children: Secondary | ICD-10-CM | POA: Insufficient documentation

## 2011-12-17 DIAGNOSIS — M2560 Stiffness of unspecified joint, not elsewhere classified: Secondary | ICD-10-CM | POA: Insufficient documentation

## 2011-12-17 DIAGNOSIS — M2550 Pain in unspecified joint: Secondary | ICD-10-CM | POA: Insufficient documentation

## 2011-12-17 DIAGNOSIS — M6281 Muscle weakness (generalized): Secondary | ICD-10-CM | POA: Insufficient documentation

## 2011-12-20 ENCOUNTER — Ambulatory Visit: Payer: Self-pay | Admitting: Physical Therapy

## 2011-12-30 ENCOUNTER — Encounter: Payer: Self-pay | Admitting: Family Medicine

## 2011-12-30 ENCOUNTER — Ambulatory Visit (INDEPENDENT_AMBULATORY_CARE_PROVIDER_SITE_OTHER): Payer: Self-pay | Admitting: Family Medicine

## 2011-12-30 ENCOUNTER — Ambulatory Visit: Payer: Self-pay | Admitting: Family Medicine

## 2011-12-30 VITALS — Ht 64.0 in | Wt 216.0 lb

## 2011-12-30 VITALS — BP 118/82 | HR 88 | Temp 98.6°F | Ht 64.0 in | Wt 216.0 lb

## 2011-12-30 DIAGNOSIS — I10 Essential (primary) hypertension: Secondary | ICD-10-CM

## 2011-12-30 DIAGNOSIS — M79604 Pain in right leg: Secondary | ICD-10-CM

## 2011-12-30 DIAGNOSIS — E669 Obesity, unspecified: Secondary | ICD-10-CM

## 2011-12-30 DIAGNOSIS — M545 Low back pain, unspecified: Secondary | ICD-10-CM

## 2011-12-30 NOTE — Patient Instructions (Signed)
It was great to see you today! Your blood pressure was great today.  Stay on your current medication.  We will check in in 2-3 months. Your back pain is hard to treat due to the side effects you experience from medications.  Your best bet is going to be weight loss.  I am very glad you are going to be seeing our nutritionist today!

## 2011-12-30 NOTE — Assessment & Plan Note (Signed)
Well controlled today.  No side effects from medications.  F/u 2-3 months.  Cont current meds.

## 2011-12-30 NOTE — Assessment & Plan Note (Signed)
Most likely related to overweight.  Has not tolerated meds I feel would be most helpful.  Am not interested in trying any stronger medications due to history of side effects from tylenol/mobic/motrin/flexeril.  Will encourage weight loss as possible solution.

## 2011-12-30 NOTE — Patient Instructions (Addendum)
-   Eat at least 3 meals and 1-2 snacks per day.  Aim for no more than 5 hours between eating.  - Fruit and (small amounts of) cheese make good snacks.   - Obtain twice as many veg's as protein or carbohydrate foods for both lunch and dinner.  - Include vegetables with both lunch and dinner.    - Vegetables and fruit provide potassium, which is good for your blood pressure AND will help you lose weight.   - Recommended salad dressing that is relatively low in both fat & sodium: Newman's Own Lite Raspberry & [place].   - If you drink 6 cups of coffee a day with 3 teaspoons sugar in each, that equals almost 300 calories.    - 300 calories X 7 days = 2100 calories a week.  This is about 2/3 of a pound of fat from your sugar in coffee weekly.    - By decreasing your sugar intake, you will decrease your calories.  (TASTE PREFERENCES ARE LEARNED!)  - Today's lunch:   Peanut butter sandwich, steamed vegetables - Dinner tonight:  Salmon cakes, cooked vegetables, potatoes - When you shop next, get grapefruit and oranges, frozen veg's, and some meat to cook from scratch (to use for sandwich meat).

## 2011-12-30 NOTE — Progress Notes (Signed)
Subjective: The patient is a 56 y.o. year old female who presents today for follow up.  BP: No problems with dizziness, chest pain, or SOB.  No LE edema, continues to complain of some hand swelling (subjective).  Back pain: discharged from PT with 50% improvement per their notes.  Still has significant right sided back pain over the entire back.  Has not tolerated mobic, tylenol, motrin, or muscle relaxants.  No bowel/bladder problems.  Objective:  There were no vitals filed for this visit. Gen: Obese, otherwise NAD CV: RRR Resp: CTABL Back: Muscle tightness over the right side of the back, no point tenderness, no tenderness of the spine or left side of back. Ext: No significant edema of hands.  2+ pulses.  Assessment/Plan:  Please also see individual problems in problem list for problem-specific plans.

## 2011-12-30 NOTE — Progress Notes (Signed)
Medical Nutrition Therapy:  Appt start time: 1000 end time:  1100.  Assessment:  Primary concerns today: Weight management and blood pressure control.  Ms. Dawn Lucas said she wants to lose 100 lb, but exercise ability is limited b/c of back pain, so as we discussed, weight loss will mean emphasizing dietary changes.  She has reduced intake of salty snack foods and canned veg's per Dr. Radonna Ricker advice.  Usual eating pattern includes 1-2 meals and several snacks per day.  Only recently started to eat breakfast b/c of taking med's with food, but Ms. Dawn Lucas still usually skips lunch.    Everyday foods include 8-10 cups of coffee w/ 3 tsp sugar per cup.  Avoided foods include turnips and Brussels sprouts.   24-hr recall: B (10 AM)-   2 scrambled eggs w/ cheese, 2 slc toast w/ jelly, coffee w/ 3 tsp sugar.  Snk ( AM)-   3 more cups coffee w/ 3 tsp sugar each L ( PM)-  none Snk ( PM)-  none D (7 PM)-  2 salmon patties, water Snk (9 PM)-  1 salmon patty, 1 c coffee, 3 tsp sugar  Usual physical activity includes none.  Progress Towards Goal(s):  In progress.   Nutritional Diagnosis:  NB-2.1 Physical inactivity As related to back pain.  As evidenced by self-report of no regular exercise.    Intervention:  Nutrition counseling.  Monitoring/Evaluation:  Dietary intake, exercise, and body weight in 1 month.

## 2012-01-07 ENCOUNTER — Other Ambulatory Visit: Payer: Self-pay | Admitting: Family Medicine

## 2012-01-07 DIAGNOSIS — N63 Unspecified lump in unspecified breast: Secondary | ICD-10-CM

## 2012-01-15 ENCOUNTER — Ambulatory Visit
Admission: RE | Admit: 2012-01-15 | Discharge: 2012-01-15 | Disposition: A | Discharge status: Home / Self Care | Payer: Self-pay | Source: Ambulatory Visit | Attending: Family Medicine | Admitting: Family Medicine

## 2012-01-15 DIAGNOSIS — N63 Unspecified lump in unspecified breast: Secondary | ICD-10-CM

## 2012-01-22 ENCOUNTER — Ambulatory Visit (INDEPENDENT_AMBULATORY_CARE_PROVIDER_SITE_OTHER): Payer: Self-pay | Admitting: Family Medicine

## 2012-01-22 ENCOUNTER — Encounter: Payer: Self-pay | Admitting: Family Medicine

## 2012-01-22 VITALS — Ht 64.0 in | Wt 219.2 lb

## 2012-01-22 DIAGNOSIS — I10 Essential (primary) hypertension: Secondary | ICD-10-CM

## 2012-01-22 DIAGNOSIS — E669 Obesity, unspecified: Secondary | ICD-10-CM

## 2012-01-22 NOTE — Progress Notes (Signed)
Medical Nutrition Therapy:  Appt start time: 1000 end time:  1100.  Assessment:  Primary concerns today: Weight management and blood pressure control.  Dawn Lucas has been out of her HCTZ for the past couple of days, and she can't get more until after May 9 b/c of financial constraints.  She has been eating more salads and veg's and fruit for snacks, and she is eating less of potato chips and candy.  She has reduced sugar in her coffee to two teaspoons, and she has also reduced her intake to 3-4 cups per day.  Despite Dawn Lucas's reported changes, her weight is up 3 lb in the past month.   Usual physical activity includes none b/c of back pain.  I have simplified food goals, and provided a "goals sheet" for Dawn Lucas to track her behaviors, hoping this may be helpful to her success.    24-hr recall:  B (8:30 AM)-  1 c coffee w/ 2 tsp sugar, canned chx sandwich w/ mayo & 1 boiled egg Snk ( AM)-  none L (12 PM)-  2 slc bacon, 3 frozen blueberry pancakes, 1 tbsp syrup, water Snk (2 PM)-  1 orange, a couple of vegetable chips D ( PM)-  Can't remember; not sure she ate Snk (12 AM)-  2 oranges  Progress Towards Goal(s):  In progress.   Nutritional Diagnosis:  NB-2.1 Physical inactivity As related to back pain.  As evidenced by self-report of no regular exercise.    Intervention:  Nutrition counseling.  Monitoring/Evaluation:  Dietary intake, exercise, and body weight in 1 month.

## 2012-01-22 NOTE — Patient Instructions (Addendum)
-   Reminder from last appt:  Eat at least 3 meals and 1-2 snacks per day. Aim for no more than 5 hours between eating. - Limit sodium to 1500 mg per day.  - Include vegetables at both lunch and dinner.   - Record your progress on your goals sheet provided today.

## 2012-01-24 ENCOUNTER — Telehealth: Payer: Self-pay | Admitting: Family Medicine

## 2012-01-24 MED ORDER — HYDROCHLOROTHIAZIDE 12.5 MG PO TABS: 12.5000 mg | ORAL_TABLET | Freq: Every day | ORAL | Status: DC

## 2012-01-24 NOTE — Telephone Encounter (Signed)
Spoke with patient and informed her of below 

## 2012-01-27 ENCOUNTER — Ambulatory Visit: Payer: Self-pay | Admitting: Family Medicine

## 2012-02-20 ENCOUNTER — Ambulatory Visit (INDEPENDENT_AMBULATORY_CARE_PROVIDER_SITE_OTHER): Payer: Self-pay | Admitting: Family Medicine

## 2012-02-20 ENCOUNTER — Encounter: Payer: Self-pay | Admitting: Family Medicine

## 2012-02-20 VITALS — Ht 64.0 in | Wt 214.4 lb

## 2012-02-20 DIAGNOSIS — E669 Obesity, unspecified: Secondary | ICD-10-CM

## 2012-02-20 DIAGNOSIS — I10 Essential (primary) hypertension: Secondary | ICD-10-CM

## 2012-02-20 NOTE — Progress Notes (Signed)
Medical Nutrition Therapy:  Appt start time: 1130 end time:  1230.  Assessment:  Primary concerns today: Weight management and blood pressure control.  Dawn Lucas has completed her goals sheet most days, but has been sick off and on for 3 weeks (nausea and vomiting), so eating has not been normal.  She has also had a couple of days of diarrhea recently as well.  She feels like her nausea comes from a severe headache.  In addn, her low back pain and neck/shoulder pain has continued to get worse.  Vomiting seems to help relieve headache some.  Back pain has started to radiate down her leg.  She has not been able to tolerate any of the pain med's previously prescribed.  Dawn Lucas completed rehab therapy a couple months ago, which did seem to help some with her neck/shoulders, but not with lower back.      Dawn Lucas spends most of her day sitting on the edge of her bed (back unsupported), watching TV; she said she is in too much pain to walk much, but does take occasional walks to the porch and back.   Goals sheets suggest Dawn Lucas has kept her Na intake well under 1500 mg/day on most days; however, it is clear that she has sometimes been mis-reading labels by not considering serving size.  She did say her feet/ankle swelling seems to have improved in recent weeks.  She has usually been eating 3 meals a day and getting veg's twice a day.  She has also continued to reduce coffee intake, currently <2 cups/day.    24-hr recall suggests intake of <900 kcal & mg 2100 Na:  B (10 AM)-  5 small link sausages, 1/2 c coffee w/ 2 tsp sugar  (200 kcal) Snk ( AM)-  none L ( PM)-  1 chx wing, salad w/ 1-2 tbsp dressing, water  (200  kcal) Snk ( PM)-  none D (6 PM)-  1 package [employer] broccoli w/ pasta & cheese, water (440 kcal) Snk ( PM)-  12 oz seltzer water Yesterday was typical of the past 3 weeks, but it is less food than before she started feeling sick.    Progress Towards Goal(s):  In  progress.   Nutritional Diagnosis:  NB-2.1 Physical inactivity As related to back pain.  As evidenced by self-report of no regular exercise.    Intervention:  Nutrition counseling.  Monitoring/Evaluation:  Dietary intake, exercise, and body weight in 1 month.

## 2012-02-20 NOTE — Patient Instructions (Addendum)
-   When you check the sodium content on a label, be sure to notice how your serving size compares to the one on the label, i.e., if you eat twice as much, double the mg of sodium.   - Keep doing the good job you are doing in getting 3 meals a day and 2 vegetable servings per day.   - Walk to the extent you can each day, even if it is only 1 minute at a time.   - When you are watching TV at home, stand up at every commercial.   - Continue to complete goals sheets daily, and bring to appt with Dr. Louanne Belton.   - Talk with Dr. Louanne Belton about what you can do to address your back and neck pain.    - When you feel you want to follow up with more nutrition, call Dr. Gerilyn Pilgrim:  [phone].

## 2012-03-16 ENCOUNTER — Ambulatory Visit (INDEPENDENT_AMBULATORY_CARE_PROVIDER_SITE_OTHER): Payer: Self-pay | Admitting: Family Medicine

## 2012-03-16 ENCOUNTER — Encounter: Payer: Self-pay | Admitting: Family Medicine

## 2012-03-16 VITALS — BP 119/79 | HR 79 | Temp 98.4°F | Ht 64.0 in | Wt 214.6 lb

## 2012-03-16 DIAGNOSIS — M549 Dorsalgia, unspecified: Secondary | ICD-10-CM

## 2012-03-16 DIAGNOSIS — M25562 Pain in left knee: Secondary | ICD-10-CM

## 2012-03-16 DIAGNOSIS — M25569 Pain in unspecified knee: Secondary | ICD-10-CM

## 2012-03-16 DIAGNOSIS — M25561 Pain in right knee: Secondary | ICD-10-CM

## 2012-03-16 NOTE — Patient Instructions (Signed)
I want you to make an appointment with sports medicine in the next several weeks. I want you to go over to the hospital to get your knees x-rays. Come back to see me after you have been seen at sports medicine.

## 2012-03-20 ENCOUNTER — Encounter: Payer: Self-pay | Admitting: Family Medicine

## 2012-03-20 DIAGNOSIS — M25562 Pain in left knee: Secondary | ICD-10-CM | POA: Insufficient documentation

## 2012-03-20 DIAGNOSIS — M25561 Pain in right knee: Secondary | ICD-10-CM | POA: Insufficient documentation

## 2012-03-20 NOTE — Assessment & Plan Note (Signed)
I wonder if there is a component of OA along with obesity and poor mechanics.  Will send for plain films and ask for further evaluation at sports med.  Patient may be open to trial of injections if she does not actually have to see the needle.  She is not very open to other medications and has a tendency to develop various nausea/vomiting/dizziness reactions to those she tries.

## 2012-03-20 NOTE — Progress Notes (Signed)
Patient ID: Dawn Lucas, female   DOB: 1956/06/11, 56 y.o.   MRN: [id number] Subjective: The patient is a 56 y.o. year old female who presents today for back/hip/knee/ankle pain.  Patient reports continued problems with pain in her knees, hips, low back, and ankles.  Pain is a constant ache, made worse by standing for extended periods of time.  Nothing really makes it better.  There is no recent injury.  She has been seen, and discharged from, physical therapy for low back pain.  She does not take any medications for this due to having had previous intolerances to mobic, naproxen, motrin, tylenol, flexeril, and Robaxin.  There is no obvious inciting event.  Review of patient's records shows that she has had a recent MRI of the next for neck pain and numbness in her arms.  This study showed mild degenerative changes.  Within the last two years she has had plain films of her L-spine that showed minimal changes.  Patient's past medical, social, and family history were reviewed and updated as appropriate. History  Substance Use Topics  . Smoking status: Current Everyday Smoker -- 0.1 packs/day for 45 years    Types: Cigarettes  . Smokeless tobacco: Not on file  . Alcohol Use: Yes   Objective:  Filed Vitals:   03/16/12 1347  BP: 119/79  Pulse: 79  Temp: 98.4 F (36.9 C)   Gen: Obese, no immediate distress Hips: No pain on palpation over greater trochanter bilaterally.  Patient has limited external rotation, R>L, while seated but preserved internal rotation.  Pain limits rotation.  Negative straight leg raise.  Significant tenderness over SI joints and with pelvic rock. Knees: No joint effusions or deformity.  Full flexion/extension with some crepitus felt.  Negative McMurray's.  Good collateral stability.  Assessment/Plan:  Please also see individual problems in problem list for problem-specific plans.

## 2012-03-20 NOTE — Assessment & Plan Note (Signed)
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  Patient has developed intolerance to all normal medications used to treat this condition.  Will send to sports med for confirmation/gait analysis and for consideration for US guided SI injection.

## 2012-03-24 ENCOUNTER — Encounter: Payer: Self-pay | Admitting: Family Medicine

## 2012-03-24 ENCOUNTER — Ambulatory Visit (INDEPENDENT_AMBULATORY_CARE_PROVIDER_SITE_OTHER): Payer: Self-pay | Admitting: Family Medicine

## 2012-03-24 VITALS — BP 133/78 | HR 116 | Ht 64.0 in | Wt 214.0 lb

## 2012-03-24 DIAGNOSIS — G8929 Other chronic pain: Secondary | ICD-10-CM

## 2012-03-24 DIAGNOSIS — M76899 Other specified enthesopathies of unspecified lower limb, excluding foot: Secondary | ICD-10-CM

## 2012-03-24 DIAGNOSIS — M533 Sacrococcygeal disorders, not elsewhere classified: Secondary | ICD-10-CM

## 2012-03-24 DIAGNOSIS — M542 Cervicalgia: Secondary | ICD-10-CM

## 2012-03-24 DIAGNOSIS — IMO0002 Reserved for concepts with insufficient information to code with codable children: Secondary | ICD-10-CM

## 2012-03-24 DIAGNOSIS — M5416 Radiculopathy, lumbar region: Secondary | ICD-10-CM

## 2012-03-24 DIAGNOSIS — M7062 Trochanteric bursitis, left hip: Secondary | ICD-10-CM

## 2012-03-24 DIAGNOSIS — M7061 Trochanteric bursitis, right hip: Secondary | ICD-10-CM

## 2012-03-24 MED ORDER — PREDNISONE 20 MG PO TABS: ORAL_TABLET | ORAL | Status: AC

## 2012-03-24 MED ORDER — AMITRIPTYLINE HCL 50 MG PO TABS: 50.0000 mg | ORAL_TABLET | Freq: Every day | ORAL | Status: DC

## 2012-03-24 MED ORDER — TRAMADOL HCL 50 MG PO TABS: 50.0000 mg | ORAL_TABLET | Freq: Four times a day (QID) | ORAL | Status: AC | PRN

## 2012-03-24 NOTE — Patient Instructions (Addendum)
Get MRI of back  Fill meds as prescribed Recheck in 1 month with Dr. Frazier Butt  You have been scheduled for a MRI of your back at [facility] on  Friday 03/27/12. Arrive at [facility] Radiology department on the 1st floor at 3:45 pm.  They will x-ray your knee after this appointment.

## 2012-03-24 NOTE — Progress Notes (Signed)
[facility] [address] [place], [place] [address] Phone: [phone] Fax: [phone]   Patient Name: Dawn Lucas Date of Birth: 11-10-1955 Medical Record Number: [id number] Gender: female Date of Encounter: 03/24/2012  History of Present Illness:  Dawn Lucas is a 56 y.o. very pleasant female patient who presents with the following:  Pleasant patient who presents with approx 4-5 years of severe, worsening low back pain with radiculopathy R > L posteriorly and laterally. She also complains of diffuse neck, shoulder, and shoulder blade pain.  The patient notes pain when I shake her hand and when I touch her shoulder.   No history of trauma. No history of neurosurgery.  No bowel or bladder incontinence.   Taking Elavil 25 mg po qhs. Unable to tolerate oral NSAIDS recently.  Also with lateral GTB pain bilaterally.  Has been through a round of PT without success. Never had ESI  RIGHT HIP - COMPLETE 2+ VIEW   Comparison: None.   Findings: The hips are located.  There is no fracture.  No notable degenerative change about the hips.  Soft tissue structures unremarkable.   IMPRESSION: Negative exam.   Original Report Authenticated By: Bernadene Bell. D'ALESSIO, M.D.  LUMBAR SPINE - COMPLETE 4+ VIEW   Comparison: None.   Findings: Vertebral body height and alignment are normal.  There is facet degenerative disease in the lower lumbar spine.  Mild loss of disc space height at L5-S1 is noted.  Paraspinous structures are unremarkable.   IMPRESSION: No acute finding.  Mild appearing lower lumbar spondylosis.   Original Report Authenticated By: Bernadene Bell. Maricela Curet, M.D.  CERVICAL SPINE - COMPLETE 4+ VIEW   Comparison: None.   Findings: Vertebral body height and alignment are normal. Intervertebral disc space height is maintained.  Neural foramina appear open.  Prevertebral soft tissues are normal.  Mild to moderate scattered facet  degenerative disease is noted.  Lung apices are clear.   IMPRESSION: Mild to moderate scattered facet degenerative disease.  Otherwise negative.   Original Report Authenticated By: Bernadene Bell. D'ALESSIO, M.D.  MRI CERVICAL SPINE WITHOUT CONTRAST   Technique:  Multiplanar and multiecho pulse sequences of the cervical spine, to include the craniocervical junction and cervicothoracic junction, were obtained according to standard protocol without intravenous contrast.   Comparison: 07/04/2011 plain film exam.  No comparison MR.   Findings: Mild prominence soft tissue posterior-superior nasopharynx may represent combination of mildly prominent adenoidal tissue with small Thornwaldt cyst.  If the patient were high risk for development of nasopharyngeal malignancy (heavy alcohol/smoking history) one could perform direct visualization to exclude subtle mucosal abnormality.   Cervical medullary junction and visualized intracranial structures unremarkable.  No focal cervical cord signal abnormality.   C2-3:  Minimal bulge.   C3-4:  Bulge/shallow protrusion.  Minimal cord contact.   C4-5:  Mild bulge.  Minimal right foraminal narrowing.   C5-6:  Bulge.  Mild right uncinate hypertrophy.  Mild right foraminal narrowing.   C6-7:  Bulge.   C7-T1:  Negative.   IMPRESSION: Scattered minimal to mild degenerative changes most notable C3-4 level as detailed above.   Original Report Authenticated By: Fuller Canada, M.D.        Patient Active Problem List  Diagnosis  . Back pain  . Obese  . Tobacco abuse  . Sleep disorder  . Neck pain, chronic  . Hypertension  . Knee pain, bilateral   Past Medical History  Diagnosis Date  . Anemia   . UTI (  lower urinary tract infection)   . Back pain   . Hypertension 11/04/2011   Past Surgical History  Procedure Date  . Cholecystectomy   . Ovarian cyst removal    History  Substance Use Topics  . Smoking status: Current Everyday  Smoker -- 0.1 packs/day for 45 years    Types: Cigarettes  . Smokeless tobacco: Not on file  . Alcohol Use: Yes   No family history on file. Allergies  Allergen Reactions  . Quinolones   . Sulfa Antibiotics     Medication list has been reviewed and updated.  Prior to Admission medications   Medication Sig Start Date End Date Taking? Authorizing Provider  amitriptyline (ELAVIL) 50 MG tablet Take 1 tablet (50 mg total) by mouth at bedtime. 03/24/12 03/24/13  Hannah Beat, MD  hydrochlorothiazide (HYDRODIURIL) 12.5 MG tablet Take 1 tablet (12.5 mg total) by mouth daily. 01/24/12 01/23/13  Brent Bulla, MD  predniSONE (DELTASONE) 20 MG tablet 2 po daily for 2 weeks, then 1 po daily for 1 week 03/24/12 04/03/12  Hannah Beat, MD  traMADol (ULTRAM) 50 MG tablet Take 1 tablet (50 mg total) by mouth every 6 (six) hours as needed for pain. 03/24/12 04/03/12  Hannah Beat, MD    Review of Systems:   GEN: No fevers, chills. Nontoxic. Primarily MSK c/o today. MSK: Detailed in the HPI GI: tolerating PO intake without difficulty Neuro: detailed above Otherwise the pertinent positives of the ROS are noted above.    Physical Examination: Filed Vitals:   03/24/12 1445  BP: 133/78  Pulse: 116   Filed Vitals:   03/24/12 1445  Height: 5\' 4"  (1.626 m)  Weight: 214 lb (97.07 kg)   Body mass index is 36.73 kg/(m^2).   GEN: Well-developed,well-nourished,in no acute distress; alert,appropriate and cooperative throughout examination HEENT: Normocephalic and atraumatic without obvious abnormalities. Ears, externally no deformities PULM: Breathing comfortably in no respiratory distress EXT: No clubbing, cyanosis, or edema PSYCH: Normally interactive. Cooperative during the interview. Pleasant. Friendly and conversant. Not anxious or depressed appearing. Normal, full affect.  Range of motion at  the waist: Flexion, extension, lateral bending and rotation: notable flexion and ext rest  No  echymosis or edema Rises to examination table with mild difficulty Gait: minimally antalgic  Inspection/Deformity: N Paraspinus Tenderness: diffuse and complains of pain throughout cervical, thoracic, and lumbar spine  B Ankle Dorsiflexion (L5,4): 5/5 B Great Toe Dorsiflexion (L5,4): 5/5 Heel Walk (L5): WNL Toe Walk (S1): WNL Rise/Squat (L4): WNL, mild pain  SENSORY B Medial Foot (L4): decreased on L B Dorsum (L5): decreased on L B Lateral (S1): decreased on L Light Touch: c/o decreased sensation throughout thigh and LE on the left  REFLEXES Knee (L4): 2+ Ankle (S1): 2+  B SLR, seated: neg B SLR, supine: neg B FABER: neg B Greater Troch: TTP B Log Roll: neg B Stork: NT B Sciatic Notch: TTP   Assessment and Plan:  1. Lumbar radiculopathy, chronic  DG Lumbar Spine Complete, MR Lumbar Spine Wo Contrast  2. Chronic neck pain    3. Trochanteric bursitis of both hips    4. SI (sacroiliac) joint dysfunction     Multiple MSK c/o. DDD with spondyloarthropathy  Worsening 4 year history of B lumbar radiculopathy. Obtain an MRI of the lumbar spine without contrast to evaluate for spinal stenosis, cord edema, cord compromise, foraminal impingement in light of decreased sensation on the left and failure of conservative care.  40 mg prednisone for 7 days, then  20 mg x 7 days Increase Elavil to 50 mg at night Very reasonable to have tramadol available in DDD and spondyloarthropathy.  Orders Today: Orders Placed This Encounter  Procedures  . DG Lumbar Spine Complete    Standing Status: Future     Number of Occurrences:      Standing Expiration Date: 05/24/2013    Order Specific Question:  Preferred imaging location?    Answer:  [facility]    Order Specific Question:  Reason for exam:    Answer:  worsening back pain  . MR Lumbar Spine Wo Contrast    Standing Status: Future     Number of Occurrences:      Standing Expiration Date: 05/24/2013    Order Specific  Question:  Is the patient pregnant?    Answer:  No    Order Specific Question:  Does the patient have a pacemaker, internal devices, implants, aneury    Answer:  No    Order Specific Question:  Preferred imaging location?    Answer:  [facility]    Order Specific Question:  Reason for exam:    Answer:  worsening back pain, lumbar radiculopathy    Medications Today: Meds ordered this encounter  Medications  . amitriptyline (ELAVIL) 50 MG tablet    Sig: Take 1 tablet (50 mg total) by mouth at bedtime.    Dispense:  30 tablet    Refill:  3  . predniSONE (DELTASONE) 20 MG tablet    Sig: 2 po daily for 2 weeks, then 1 po daily for 1 week    Dispense:  21 tablet    Refill:  0  . traMADol (ULTRAM) 50 MG tablet    Sig: Take 1 tablet (50 mg total) by mouth every 6 (six) hours as needed for pain.    Dispense:  50 tablet    Refill:  2     Hannah Beat, MD

## 2012-03-27 ENCOUNTER — Ambulatory Visit (HOSPITAL_COMMUNITY)
Admission: RE | Admit: 2012-03-27 | Discharge: 2012-03-27 | Disposition: A | Discharge status: Home / Self Care | Payer: Self-pay | Source: Ambulatory Visit | Attending: Family Medicine | Admitting: Family Medicine

## 2012-03-27 DIAGNOSIS — M25561 Pain in right knee: Secondary | ICD-10-CM

## 2012-03-27 DIAGNOSIS — M79609 Pain in unspecified limb: Secondary | ICD-10-CM | POA: Insufficient documentation

## 2012-03-27 DIAGNOSIS — M5416 Radiculopathy, lumbar region: Secondary | ICD-10-CM

## 2012-03-27 DIAGNOSIS — IMO0002 Reserved for concepts with insufficient information to code with codable children: Secondary | ICD-10-CM | POA: Insufficient documentation

## 2012-03-27 DIAGNOSIS — M25562 Pain in left knee: Secondary | ICD-10-CM

## 2012-03-30 ENCOUNTER — Telehealth: Payer: Self-pay | Admitting: *Deleted

## 2012-03-30 NOTE — Telephone Encounter (Signed)
Spoke with pt- gave her x-ray results. 

## 2012-03-30 NOTE — Telephone Encounter (Signed)
Message copied by Mora Bellman on Mon Mar 30, 2012 10:29 AM ------      Message from: Hannah Beat      Created: Sun Mar 29, 2012  7:58 AM       Please call            Xrays and MRI show some arthritis in the back, but there are no emergent findings, which is great. Nothing that looks like a surgical problem.            Finish prednisone, increase the amitryptiline like we talked about, then recheck with Dr. Margaretha Sheffield in a few weeks

## 2012-04-22 ENCOUNTER — Ambulatory Visit: Payer: Self-pay | Admitting: Family Medicine

## 2012-04-27 ENCOUNTER — Ambulatory Visit: Payer: Self-pay | Admitting: Sports Medicine

## 2012-04-29 ENCOUNTER — Ambulatory Visit: Payer: Self-pay | Admitting: Family Medicine

## 2012-05-22 ENCOUNTER — Encounter: Payer: Self-pay | Admitting: Family Medicine

## 2012-05-22 ENCOUNTER — Ambulatory Visit (INDEPENDENT_AMBULATORY_CARE_PROVIDER_SITE_OTHER): Payer: Self-pay | Admitting: Family Medicine

## 2012-05-22 VITALS — BP 132/82 | HR 90 | Temp 98.1°F | Ht 64.0 in | Wt 217.6 lb

## 2012-05-22 DIAGNOSIS — M791 Myalgia, unspecified site: Secondary | ICD-10-CM

## 2012-05-22 DIAGNOSIS — IMO0001 Reserved for inherently not codable concepts without codable children: Secondary | ICD-10-CM

## 2012-05-22 DIAGNOSIS — M549 Dorsalgia, unspecified: Secondary | ICD-10-CM

## 2012-05-22 MED ORDER — AMITRIPTYLINE HCL 75 MG PO TABS: 75.0000 mg | ORAL_TABLET | Freq: Every day | ORAL | Status: DC

## 2012-05-22 MED ORDER — HYDROCHLOROTHIAZIDE 12.5 MG PO TABS: 12.5000 mg | ORAL_TABLET | Freq: Every day | ORAL | Status: DC

## 2012-05-22 MED ORDER — TRAMADOL HCL 50 MG PO TABS: 50.0000 mg | ORAL_TABLET | Freq: Three times a day (TID) | ORAL | Status: AC | PRN

## 2012-05-22 NOTE — Patient Instructions (Signed)
Schedule an appointment with me in the next several weeks to remove the mole under your arm. Increase your bedtime medication to 75mg  every night. I have given you refills on the rest of your medications.

## 2012-05-23 LAB — SEDIMENTATION RATE: Sed Rate: 28 mm/hr — ABNORMAL HIGH (ref 0–22)

## 2012-05-25 ENCOUNTER — Ambulatory Visit (INDEPENDENT_AMBULATORY_CARE_PROVIDER_SITE_OTHER): Payer: Self-pay | Admitting: Sports Medicine

## 2012-05-25 ENCOUNTER — Encounter: Payer: Self-pay | Admitting: Sports Medicine

## 2012-05-25 VITALS — BP 130/89 | HR 86 | Ht 64.0 in | Wt 217.0 lb

## 2012-05-25 DIAGNOSIS — M545 Low back pain, unspecified: Secondary | ICD-10-CM

## 2012-05-25 LAB — ANA: Anti Nuclear Antibody(ANA): NEGATIVE

## 2012-05-25 MED ORDER — DICLOFENAC SODIUM 75 MG PO TBEC: 75.0000 mg | DELAYED_RELEASE_TABLET | Freq: Two times a day (BID) | ORAL | Status: DC

## 2012-05-25 NOTE — Progress Notes (Signed)
  Subjective:    Patient ID: Dawn Lucas, female    DOB: 29-May-1956, 56 y.o.   MRN: [id number]  HPI chief complaint: Low back pain and knee pain  Patient is a new patient to me but is a well-established patient with [facility]. She has a history of chronic low back pain. Today she complains of diffuse pain in her back and her knees. No recent trauma. Her primary care physician recently placed her on Ultram and is somewhat helpful. She's not currently on any anti-inflammatory medicines. She had an MRI done of her lumbar spine on 03/27/2012. Mild to moderate facet arthropathy was noted at L4-L5 and L5-S1. Her pain is diffuse. Not really alleviated with rest or activity. No change in bowel or bladder habits.    Review of Systems     Objective:   Physical Exam Well-developed, well-nourished. No acute distress  She is diffusely tender to palpation along the lumbar spine, even to light touch. No spasm. Negative log roll. Negative straight leg raise bilaterally. Strength testing is limited by pain but no focal deficits appreciated. She walks unassisted with a slight limp.       Assessment & Plan:  Chronic low back pain with MRI evidence of lumbar facet DJD  Patient has not tolerated Mobic in the past but I think we should try an oral anti-inflammatory. We will try her on Voltaren 75 mg twice daily. She will continue with her Ultram when necessary. She tells me that her primary care physician has ordered an inflammatory arthropathy panel and results are currently pending. She is asking about the possibility of disability and I've explained to her that that decision is best deferred to her primary care physician. Although she has MRI evidence of facet arthropathy, her clinical exam does not really fit this picture and I do not believe she would benefit from facet injections. Followup when necessary.

## 2012-05-28 ENCOUNTER — Ambulatory Visit: Payer: Self-pay | Admitting: Sports Medicine

## 2012-06-02 NOTE — Assessment & Plan Note (Addendum)
At this time, I am leaning more toward a diagnosis of fibromyalgia.  Patient has tender points, significant pain that is out of proportion to imaging findings, and significant disability from this.  Before officially making this diagnosis, I will obtain basic labs to screen for rheumatic/connective tissue disorders.  If they come back negative, I think that we will need to go down the fibromyalgia route.  The patient does have some DJD in various areas but it is not enough to explain her pain/disability.  I will also increase amatryptaline to 75mg  qhs to help both with sleep and for the presumptive diagnosis of fibromyalgia.

## 2012-06-02 NOTE — Progress Notes (Signed)
Patient ID: Dawn Lucas, female   DOB: 1956-05-20, 56 y.o.   MRN: [id number] Subjective: The patient is a 56 y.o. year old female who presents today for back pain.  1. Back pain: Now a long standing problem.  Has been having back and neck pain now for several years.  Has been tried on several different medications for this, most recently Elavil and Tramadol.  She continues to have generalized back pain.  No bowel or bladder symptoms.  No significant problems with her legs.  No shooting pains or weakness.  Wants to know if there are any other tests that need to be done.  Pain is generally achy in quality.  She was seen in sports med clinic several months ago for low back pain and sciatica, which has since at least somewhat resolved.  No current symptoms of sciatica  Patient's past medical, social, and family history were reviewed and updated as appropriate. History  Substance Use Topics  . Smoking status: Current Everyday Smoker -- 0.3 packs/day for 45 years    Types: Cigarettes  . Smokeless tobacco: Not on file  . Alcohol Use: Yes   Objective:  Filed Vitals:   05/22/12 1549  BP: 132/82  Pulse: 90  Temp: 98.1 F (36.7 C)   Gen: NAD, obese Back: Multiple tender points throughout.  There are no distinct muscle spasms.  There are no obvious deformities. Neck: ROM limited secondary to discomfort.  No point bony tenderness although there are several tender points. Ext: No swelling  Assessment/Plan:  Please also see individual problems in problem list for problem-specific plans.

## 2012-06-05 ENCOUNTER — Ambulatory Visit (INDEPENDENT_AMBULATORY_CARE_PROVIDER_SITE_OTHER): Payer: Self-pay | Admitting: Family Medicine

## 2012-06-05 ENCOUNTER — Encounter: Payer: Self-pay | Admitting: Family Medicine

## 2012-06-05 VITALS — BP 153/107 | HR 102 | Temp 98.8°F | Ht 64.0 in | Wt 218.3 lb

## 2012-06-05 DIAGNOSIS — D485 Neoplasm of uncertain behavior of skin: Secondary | ICD-10-CM

## 2012-06-05 NOTE — Progress Notes (Signed)
Patient ID: Dawn Lucas, female   DOB: 05-Oct-1956, 56 y.o.   MRN: [id number] Subjective: The patient is a 56 y.o. year old female who presents today for mole removal in the right axilla.  Has been out of bp meds due to financial concerns.  Continues to have problems with pain all over body.  Patient's past medical, social, and family history were reviewed and updated as appropriate. History  Substance Use Topics  . Smoking status: Current Everyday Smoker -- 0.3 packs/day for 45 years    Types: Cigarettes  . Smokeless tobacco: Not on file  . Alcohol Use: Yes   Objective:  Filed Vitals:   06/05/12 1346  BP: 153/107  Pulse: 102  Temp: 98.8 F (37.1 C)   Procedure note: Informed consent obtained and time out performed.  Area numbed using 1%lidocaine with epi.  Area then cleaned with betadine.  Following this, mole was removed by shave biopsy and hemostasis was obtained using silver nitrate.  Patient tolerated the procedure well and was given instructions on wound care.  Assessment/Plan:  Please also see individual problems in problem list for problem-specific plans.

## 2012-06-05 NOTE — Patient Instructions (Signed)
It was good to see you today! I will know the results within 1-2 weeks.  I will call you if they are anything to be concerned about, otherwise I will send you a letter.

## 2012-06-09 ENCOUNTER — Other Ambulatory Visit: Payer: Self-pay | Admitting: Family Medicine

## 2012-06-09 DIAGNOSIS — R921 Mammographic calcification found on diagnostic imaging of breast: Secondary | ICD-10-CM

## 2012-06-10 ENCOUNTER — Encounter: Payer: Self-pay | Admitting: Family Medicine

## 2012-08-06 ENCOUNTER — Other Ambulatory Visit: Payer: Self-pay

## 2012-08-14 ENCOUNTER — Other Ambulatory Visit: Payer: Self-pay | Admitting: *Deleted

## 2012-08-14 DIAGNOSIS — M791 Myalgia, unspecified site: Secondary | ICD-10-CM

## 2012-08-14 MED ORDER — HYDROCHLOROTHIAZIDE 12.5 MG PO TABS: 12.5000 mg | ORAL_TABLET | Freq: Every day | ORAL | Status: DC

## 2012-08-14 NOTE — Addendum Note (Signed)
Addended by: Damita Lack on: 08/14/2012 04:53 PM   Modules accepted: Orders

## 2012-08-17 ENCOUNTER — Ambulatory Visit
Admission: RE | Admit: 2012-08-17 | Discharge: 2012-08-17 | Disposition: A | Discharge status: Home / Self Care | Payer: Self-pay | Source: Ambulatory Visit | Attending: Family Medicine | Admitting: Family Medicine

## 2012-08-17 DIAGNOSIS — N63 Unspecified lump in unspecified breast: Secondary | ICD-10-CM

## 2012-08-17 DIAGNOSIS — R921 Mammographic calcification found on diagnostic imaging of breast: Secondary | ICD-10-CM

## 2012-09-25 ENCOUNTER — Ambulatory Visit (INDEPENDENT_AMBULATORY_CARE_PROVIDER_SITE_OTHER): Payer: No Typology Code available for payment source | Admitting: Family Medicine

## 2012-09-25 ENCOUNTER — Encounter: Payer: Self-pay | Admitting: Family Medicine

## 2012-09-25 VITALS — BP 128/80 | HR 87 | Temp 98.2°F | Ht 64.0 in | Wt 221.2 lb

## 2012-09-25 DIAGNOSIS — G8929 Other chronic pain: Secondary | ICD-10-CM

## 2012-09-25 DIAGNOSIS — M542 Cervicalgia: Secondary | ICD-10-CM

## 2012-09-25 MED ORDER — CYCLOBENZAPRINE HCL 5 MG PO TABS: 5.0000 mg | ORAL_TABLET | Freq: Three times a day (TID) | ORAL | Status: DC | PRN

## 2012-09-25 NOTE — Patient Instructions (Signed)
I want you to start taking flexeril three times per day for the next month.  Let's see how that works for your pain.

## 2012-10-02 NOTE — Progress Notes (Signed)
Patient ID: Dawn Lucas, female   DOB: [birth year], 56 y.o.   MRN: [id number] Subjective: The patient is a 56 y.o. year old female who presents today for neck pain and headaches.  The patient is here do to continue problems with neck pain. She has problems with multiple pain complaints. She's been tried on a variety of medicines for these problems to little success. She has tried Robaxin, Mobic, Tylenol, and amitriptyline. She was either unable to tolerate side effects or do not feel that they helped her. She reports that she has been having a lot of problems with neck and back pain and that her neck pain is causing her to have headaches. Pain is located in the bilateral neck with some radiation into the occipital region of her head. She also continues to have pain on either side of her back especially in the lumbar and lower thoracic region of the spine. She denies any true weakness or bowel or bladder incontinence. Of note, the patient reports that she has no money currently will not be able to afford any medications for several weeks.  Patient's past medical, social, and family history were reviewed and updated as appropriate. History  Substance Use Topics  . Smoking status: Current Every Day Smoker -- 0.3 packs/day for 45 years    Types: Cigarettes  . Smokeless tobacco: Not on file  . Alcohol Use: Yes   Objective:  Filed Vitals:   09/25/12 1151  BP: 128/80  Pulse: 87  Temp: 98.2 F (36.8 C)   Gen: No acute distress, obese HEENT: There is mild muscle spasm through out the neck bilaterally. There are no bony deformities. There is some muscle spasm in the trapezius in the shoulders. Back: There is mild paraspinous muscle tenderness with minimal spasm bilaterally in the lumbar and lower thoracic region. The patient is tender in multiple regions throughout the back, shoulders, arms, and legs.  Assessment/Plan:  Please also see individual problems in problem list for problem-specific  plans.

## 2012-10-02 NOTE — Assessment & Plan Note (Signed)
Given the patient's multiple pain complaints in general affect, and beginning to entertain the diagnosis of fibromyalgia. Imaging has shown some degenerative disc disease in both the neck and the back but likely not enough to explain the severity of her symptoms. The patient has been seen by physical therapy and has not found significant improvement. Will give trial of Flexeril as this is indicated both for muscle spasm and in fibromyalgia. I have not discussed the possible diagnosis of fibromyalgia with the patient but will likely broach the subject in the near future.

## 2013-01-28 ENCOUNTER — Ambulatory Visit (INDEPENDENT_AMBULATORY_CARE_PROVIDER_SITE_OTHER): Payer: No Typology Code available for payment source | Admitting: Family Medicine

## 2013-01-28 ENCOUNTER — Encounter: Payer: Self-pay | Admitting: Family Medicine

## 2013-01-28 ENCOUNTER — Telehealth: Payer: Self-pay | Admitting: Family Medicine

## 2013-01-28 VITALS — BP 142/98 | HR 77 | Temp 98.6°F | Ht 64.0 in | Wt 221.3 lb

## 2013-01-28 DIAGNOSIS — G8929 Other chronic pain: Secondary | ICD-10-CM

## 2013-01-28 DIAGNOSIS — M542 Cervicalgia: Secondary | ICD-10-CM

## 2013-01-28 DIAGNOSIS — IMO0001 Reserved for inherently not codable concepts without codable children: Secondary | ICD-10-CM

## 2013-01-28 DIAGNOSIS — S025XXS Fracture of tooth (traumatic), sequela: Secondary | ICD-10-CM

## 2013-01-28 DIAGNOSIS — I10 Essential (primary) hypertension: Secondary | ICD-10-CM

## 2013-01-28 DIAGNOSIS — M791 Myalgia, unspecified site: Secondary | ICD-10-CM

## 2013-01-28 MED ORDER — HYDROCHLOROTHIAZIDE 25 MG PO TABS: 25.0000 mg | ORAL_TABLET | Freq: Every day | ORAL | Status: DC

## 2013-01-28 MED ORDER — CYCLOBENZAPRINE HCL 5 MG PO TABS: 5.0000 mg | ORAL_TABLET | Freq: Two times a day (BID) | ORAL | Status: DC

## 2013-01-28 NOTE — Patient Instructions (Signed)
Take flexeril two times per day for the next 2-3 weeks.  Please get back in to see me if this is not helping your aches. We are increasing your HCTZ for your blood pressure.  Hopefully this will help with your leg swelling some as well.

## 2013-01-28 NOTE — Progress Notes (Signed)
Patient ID: Dawn Lucas, female   DOB: [birth year], 57 y.o.   MRN: [id number] Subjective: The patient is a 57 y.o. year old female who presents today for pain.  Patient reports that she continues to struggle with pain in her neck, shoulders, and back. Pain is always present. It is nonradiating. It is described as an ache were sharp. There is no weakness in her arms or legs. There is no numbness. Pain has not been associated with any injury. Patient has been taking muscle relaxers on occasion which seemed to help at least somewhat. Otherwise, she has not been taking any medications.  Patient's past medical, social, and family history were reviewed and updated as appropriate. History  Substance Use Topics  . Smoking status: Current Every Day Smoker -- 0.30 packs/day for 45 years    Types: Cigarettes  . Smokeless tobacco: Not on file  . Alcohol Use: Yes   Objective:  Filed Vitals:   01/28/13 0958  BP: 142/98  Pulse: 77  Temp: 98.6 F (37 C)   Gen: No immediate distress Neck/back: Patient has no obvious muscle spasm but has multiple tender points located throughout her neck, back, and on her arms. They do not appear to be any tender points below her waist.  Assessment/Plan:  Please also see individual problems in problem list for problem-specific plans.

## 2013-01-28 NOTE — Telephone Encounter (Signed)
Patient needs a referral to dental clinic for a broken tooth and cap that needs to be redone.

## 2013-02-01 NOTE — Assessment & Plan Note (Signed)
Given the constellation of the patient's symptoms along with relatively benign blood work looking for a rheumatologic cause, and beginning to entertain the diagnosis of fibromyalgia. The patient certainly has many pain related complaints and has multiple tender points. They are not in the classic distribution for fibromyalgia, but are quite plentiful. The patient is very prone to experiencing side effects from medicines and so, for the moment, we will increase the frequency of her taking Flexeril and have her return in 4-6 weeks.

## 2013-02-01 NOTE — Assessment & Plan Note (Signed)
Last several blood pressures are slightly above goal. Patient does not seem to be having significant problems with her hydrochlorothiazide. I will increase the dose and see her back in several weeks to monitor for effect

## 2013-03-16 ENCOUNTER — Encounter: Payer: Self-pay | Admitting: Family Medicine

## 2013-03-16 ENCOUNTER — Ambulatory Visit (INDEPENDENT_AMBULATORY_CARE_PROVIDER_SITE_OTHER): Payer: No Typology Code available for payment source | Admitting: Family Medicine

## 2013-03-16 VITALS — BP 125/85 | HR 98 | Temp 99.7°F | Wt 217.9 lb

## 2013-03-16 DIAGNOSIS — G479 Sleep disorder, unspecified: Secondary | ICD-10-CM

## 2013-03-16 DIAGNOSIS — Z1211 Encounter for screening for malignant neoplasm of colon: Secondary | ICD-10-CM

## 2013-03-16 DIAGNOSIS — IMO0001 Reserved for inherently not codable concepts without codable children: Secondary | ICD-10-CM

## 2013-03-16 DIAGNOSIS — M797 Fibromyalgia: Secondary | ICD-10-CM

## 2013-03-16 MED ORDER — NORTRIPTYLINE HCL 10 MG PO CAPS: 10.0000 mg | ORAL_CAPSULE | Freq: Every day | ORAL | Status: DC

## 2013-03-16 NOTE — Assessment & Plan Note (Signed)
The patient meets diagnostic criteria for fibromyalgia. We will give the patient educational information on this and a trial of Pamelor.

## 2013-03-16 NOTE — Assessment & Plan Note (Signed)
Given the patient's description of stopping breathing while sleeping on her back along with her morbid obesity and large neck circumference along with daytime somnolence we will obtain a sleep study.

## 2013-03-16 NOTE — Progress Notes (Signed)
Patient ID: Cilicia Borden, female   DOB: Mar 03, 1956, 57 y.o.   MRN: [id number] Subjective: The patient is a 57 y.o. year old female who presents today for followup of her chronic pain.  1. Chronic pain: Patient continues to have significant problems with pain over her entire body. It is more focused in her back, neck, and arms but is also present in her legs. He says the pain is quite debilitating. Flexeril is no longer doing much for her. She gets small amount of exercise but not much do to the amount of pain she is in. [name] only new symptom is that she reports a somewhat long history of having problems with sleeping on her back do to stopping breathing.  Patient's past medical, social, and family history were reviewed and updated as appropriate. History  Substance Use Topics  . Smoking status: Current Every Day Smoker -- 0.30 packs/day for 45 years    Types: Cigarettes  . Smokeless tobacco: Not on file  . Alcohol Use: Yes   Objective:  Filed Vitals:   03/16/13 1347  BP: 125/85  Pulse: 98  Temp: 99.7 F (37.6 C)   Gen: Morbidly obese, no immediate distress Musculoskeletal: Patient has multiple tender points distributed throughout her shoulders, back, arms, and legs.   Assessment/Plan:  Please also see individual problems in problem list for problem-specific plans.

## 2013-03-16 NOTE — Patient Instructions (Addendum)
Fibromyalgia Fibromyalgia is a disorder that is often misunderstood. It is associated with muscular pains and tenderness that comes and goes. It is often associated with fatigue and sleep disturbances. Though it tends to be long-lasting, fibromyalgia is not life-threatening. CAUSES  The exact cause of fibromyalgia is unknown. People with certain gene types are predisposed to developing fibromyalgia and other conditions. Certain factors can play a role as triggers, such as:  Spine disorders.  Arthritis.  Severe injury (trauma) and other physical stressors.  Emotional stressors. SYMPTOMS   The main symptom is pain and stiffness in the muscles and joints, which can vary over time.  Sleep and fatigue problems. Other related symptoms may include:  Bowel and bladder problems.  Headaches.  Visual problems.  Problems with odors and noises.  Depression or mood changes.  Painful periods (dysmenorrhea).  Dryness of the skin or eyes. DIAGNOSIS  There are no specific tests for diagnosing fibromyalgia. Patients can be diagnosed accurately from the specific symptoms they have. The diagnosis is made by determining that nothing else is causing the problems. TREATMENT  There is no cure. Management includes medicines and an active, healthy lifestyle. The goal is to enhance physical fitness, decrease pain, and improve sleep. HOME CARE INSTRUCTIONS   Only take over-the-counter or prescription medicines as directed by your caregiver. Sleeping pills, tranquilizers, and pain medicines may make your problems worse.  Low-impact aerobic exercise is very important and advised for treatment. At first, it may seem to make pain worse. Gradually increasing your tolerance will overcome this feeling.  Learning relaxation techniques and how to control stress will help you. Biofeedback, visual imagery, hypnosis, muscle relaxation, yoga, and meditation are all options.  Anti-inflammatory medicines and  physical therapy may provide short-term help.  Acupuncture or massage treatments may help.  Take muscle relaxant medicines as suggested by your caregiver.  Avoid stressful situations.  Plan a healthy lifestyle. This includes your diet, sleep, rest, exercise, and friends.  Find and practice a hobby you enjoy.  Join a fibromyalgia support group for interaction, ideas, and sharing advice. This may be helpful. SEEK MEDICAL CARE IF:  You are not having good results or improvement from your treatment. FOR MORE INFORMATION  National Fibromyalgia Association: www.fmaware.org Arthritis Foundation: www.arthritis.org Document Released: 09/23/2005 Document Revised: 12/16/2011 Document Reviewed: 01/03/2010 [facility] Patient Information 2014 [place], [place].  Take Pamelor every night. We need you to get set up for a sleep study to see if you have sleep apnea.

## 2013-03-26 LAB — POC HEMOCCULT BLD/STL (HOME/3-CARD/SCREEN)
Card #2 Fecal Occult Blod, POC: NEGATIVE
Card #3 Fecal Occult Blood, POC: NEGATIVE
Fecal Occult Blood, POC: NEGATIVE

## 2013-03-26 NOTE — Addendum Note (Signed)
Addended by: [place], Shaft Corigliano on: 03/26/2013 02:53 PM   Modules accepted: Orders

## 2013-04-19 ENCOUNTER — Ambulatory Visit (HOSPITAL_BASED_OUTPATIENT_CLINIC_OR_DEPARTMENT_OTHER): Payer: No Typology Code available for payment source

## 2013-05-21 ENCOUNTER — Ambulatory Visit (HOSPITAL_BASED_OUTPATIENT_CLINIC_OR_DEPARTMENT_OTHER): Payer: No Typology Code available for payment source | Attending: Family Medicine

## 2013-05-21 VITALS — Ht 64.0 in | Wt 217.0 lb

## 2013-05-21 DIAGNOSIS — G479 Sleep disorder, unspecified: Secondary | ICD-10-CM

## 2013-05-21 DIAGNOSIS — I491 Atrial premature depolarization: Secondary | ICD-10-CM | POA: Insufficient documentation

## 2013-05-21 DIAGNOSIS — I4949 Other premature depolarization: Secondary | ICD-10-CM | POA: Insufficient documentation

## 2013-05-21 DIAGNOSIS — IMO0001 Reserved for inherently not codable concepts without codable children: Secondary | ICD-10-CM | POA: Insufficient documentation

## 2013-05-21 DIAGNOSIS — R51 Headache: Secondary | ICD-10-CM | POA: Insufficient documentation

## 2013-05-21 DIAGNOSIS — G4733 Obstructive sleep apnea (adult) (pediatric): Secondary | ICD-10-CM | POA: Insufficient documentation

## 2013-05-30 DIAGNOSIS — G4733 Obstructive sleep apnea (adult) (pediatric): Secondary | ICD-10-CM

## 2013-05-30 DIAGNOSIS — R0989 Other specified symptoms and signs involving the circulatory and respiratory systems: Secondary | ICD-10-CM

## 2013-05-30 DIAGNOSIS — R0609 Other forms of dyspnea: Secondary | ICD-10-CM

## 2013-05-30 DIAGNOSIS — I4949 Other premature depolarization: Secondary | ICD-10-CM

## 2013-05-30 DIAGNOSIS — I491 Atrial premature depolarization: Secondary | ICD-10-CM

## 2013-05-30 NOTE — Procedures (Signed)
NAMEJAN, Dawn Lucas              ACCOUNT NO.:  [id number]  MEDICAL RECORD NO.:  [id number]          PATIENT TYPE:  OUT  LOCATION:  SLEEP CENTER                 FACILITY:  [facility]  PHYSICIAN:  Clinton D. Maple Hudson, MD, FCCP, FACPDATE OF BIRTH:  1956-09-[birth year]  DATE OF STUDY:  05/21/2013                           NOCTURNAL POLYSOMNOGRAM  REFERRING PHYSICIAN:  Majel Homer, MD  REFERRING PRACTITIONER:  Majel Homer, MD  INDICATION FOR STUDY:  Insomnia with sleep apnea.  EPWORTH SLEEPINESS SCORE:  6/24.  BMI 37.  Weight 217 pounds.  Height 5 feet 4 inches, neck 14 inches.  MEDICATIONS:  Home medications are charted for review.  SLEEP ARCHITECTURE:  Total sleep time 257 minutes, with sleep efficiency 64%.  Stage I was 8.6%, stage II 68.9%, stage III absent, REM 22.6% of total sleep time.  Sleep latency 76.5 minutes, REM latency 106.5 minutes, awake after sleep onset 64.5 minutes.  Arousal index 7.7.  Bedtime medication:  None.  RESPIRATORY DATA:  Apnea-hypopnea index (AHI) 6.8 per hour.  A total of 29 events was scored including 8 obstructive apneas, 1 central apnea, 20 hypopneas.  Events were seen in all sleep positions.  REM AHI 15.5 per hour.  There were not enough events to permit application of split protocol CPAP titration on this study.  OXYGEN DATA:  Moderate snoring with oxygen desaturation to a nadir of 86% and mean oxygen saturation through the study of 94.3% on room air.  CARDIAC DATA:  Sinus rhythm with frequent PACs and PVCs, and some junctional rhythm with aberrancy.  MOVEMENT-PARASOMNIA:  A few limb jerks were noted with little effect on sleep.  Bathroom x1.  IMPRESSIONS-RECOMMENDATIONS: 1. Unremarkable sleep architecture with sleep onset at 11 p.m. and     spontaneous waking around 5 a.m.  There were a few intervals of     spontaneous nonspecific waking during the night, with no bedtime     medication taken. 2. Mild obstructive sleep apnea/hypopnea syndrome, AHI 6.8 per  hour     with non-positional events.  Moderate snoring with oxygen     desaturation to a nadir of 86% and mean oxygen saturation through     the study of 94.3% on room air. 3. Sinus rhythm with frequent PACs and PVCs and some intervals of what     appeared to be a junctional rhythm, with aberrancy with a mean     heart rate around 96 per minute. 4. On arrival, the patient complained of a lot of pain due to     fibromyalgia and headache.  She was not able to tolerate having     monitoring belts tightened and electrodes placed firmly     because of complaints that these caused pain.  She woke with the     same complaints, saying then she felt she did not sleep at all,     although these monitors did document sleep with appropriate     distribution for an adult as noted above.     Clinton D. Maple Hudson, MD, [facility], FACP Diplomate, American Board of Sleep Medicine    CDY/MEDQ  D:  05/30/2013 09:41:48  T:  05/30/2013 10:00:45  Job:  [id number]

## 2013-06-17 ENCOUNTER — Encounter: Payer: Self-pay | Admitting: Family Medicine

## 2013-06-17 ENCOUNTER — Ambulatory Visit (INDEPENDENT_AMBULATORY_CARE_PROVIDER_SITE_OTHER): Payer: No Typology Code available for payment source | Admitting: Family Medicine

## 2013-06-17 ENCOUNTER — Ambulatory Visit (HOSPITAL_COMMUNITY)
Admission: RE | Admit: 2013-06-17 | Discharge: 2013-06-17 | Disposition: A | Discharge status: Home / Self Care | Payer: No Typology Code available for payment source | Source: Ambulatory Visit | Attending: Family Medicine | Admitting: Family Medicine

## 2013-06-17 VITALS — BP 132/76 | HR 93 | Temp 99.1°F | Ht 64.0 in | Wt 213.0 lb

## 2013-06-17 DIAGNOSIS — Z1211 Encounter for screening for malignant neoplasm of colon: Secondary | ICD-10-CM | POA: Insufficient documentation

## 2013-06-17 DIAGNOSIS — G479 Sleep disorder, unspecified: Secondary | ICD-10-CM

## 2013-06-17 DIAGNOSIS — M25571 Pain in right ankle and joints of right foot: Secondary | ICD-10-CM

## 2013-06-17 DIAGNOSIS — M25579 Pain in unspecified ankle and joints of unspecified foot: Secondary | ICD-10-CM

## 2013-06-17 DIAGNOSIS — IMO0001 Reserved for inherently not codable concepts without codable children: Secondary | ICD-10-CM

## 2013-06-17 DIAGNOSIS — M7989 Other specified soft tissue disorders: Secondary | ICD-10-CM | POA: Insufficient documentation

## 2013-06-17 DIAGNOSIS — M25519 Pain in unspecified shoulder: Secondary | ICD-10-CM | POA: Insufficient documentation

## 2013-06-17 DIAGNOSIS — M7582 Other shoulder lesions, left shoulder: Secondary | ICD-10-CM | POA: Insufficient documentation

## 2013-06-17 DIAGNOSIS — M25512 Pain in left shoulder: Secondary | ICD-10-CM

## 2013-06-17 DIAGNOSIS — M797 Fibromyalgia: Secondary | ICD-10-CM

## 2013-06-17 DIAGNOSIS — M7730 Calcaneal spur, unspecified foot: Secondary | ICD-10-CM | POA: Insufficient documentation

## 2013-06-17 HISTORY — DX: Encounter for screening for malignant neoplasm of colon: Z12.11

## 2013-06-17 HISTORY — DX: Other shoulder lesions, left shoulder: M75.82

## 2013-06-17 MED ORDER — DICLOFENAC SODIUM 75 MG PO TBEC: 75.0000 mg | DELAYED_RELEASE_TABLET | Freq: Two times a day (BID) | ORAL | Status: DC

## 2013-06-17 MED ORDER — KETOROLAC TROMETHAMINE 60 MG/2ML IM SOLN
60.0000 mg | Freq: Once | INTRAMUSCULAR | Status: AC
  Administered 2013-06-17: 60 mg via INTRAMUSCULAR

## 2013-06-17 NOTE — Assessment & Plan Note (Signed)
Sleep apnea based on sleep study. With everything else going on (chronic pain, new shoulder pain, new ankle pain), defer this to next appt.   Would likely benefit from CPAP

## 2013-06-17 NOTE — Assessment & Plan Note (Signed)
Possibly part of chronic pain constellation.   Ankle xray as this is new issue for her only present for about a month and had direct bony tenderness.

## 2013-06-17 NOTE — Assessment & Plan Note (Signed)
Opting for yearly FOB.  Negative in June. Discussed this with her.   FU next year.

## 2013-06-17 NOTE — Patient Instructions (Addendum)
Go for an xray of your ankle and shoulder.  We will call you with the results.  Pain and inflammation shot today for some relief.  Take the Diclofenac starting tomorrow.  This is on the [employer] list.  If they say it is more, show them this paper and call us.  Use ice on your shoulder to help.  Think about physical therapy, I think that might be helpful.  Come back to meet Dr. Caleb Popp on Oct 3.  We'll see how you're doing at that point.  It was good to meet you today!  (Stop Pamelor -- wrote this in pen on paper after I printed it)

## 2013-06-17 NOTE — Progress Notes (Signed)
Subjective:    Dawn Lucas is a 57 y.o. female who presents to [facility] today with several questions:  1.  FU for sleep study:  Recommended previously for sleep study due to snoring back in June.  Would like results from sleep study today.  Has subjective dyspnea when laying on her back but doesn't do this.  Poor sleep secondary to many awakenings secondary to apneic spells.  Poor sleep hygiene.    2.  FU for results of FOB testing:  Patient has opted for yearly FOB rather than colonoscopy.  This were negative.  Discussed with patient.     3.  Left shoulder pain:  Describes "pain all over." Chronic for her.  Now with focalized pain Left shoulder and Right ankle.  Pain In shoulders both of her shoulders but worse on Left side s/p fall down her stairs 2 months ago.  Now unable to raise her arm above her head, limited by pain.  Mostly in front and should of shoulder where she landed.  Tripped down stairs, did not hit head, no LOC, able to get back up easily.    Right ankle:  Chronic now for about 3 months.  No specific trigger, but tender directly over the bone.  No inversion/eversion injuries.  Able to bear weight but this is painful.  Has tried OTC tylenol/ibuprofen without relief.     FOr chronic pain, last visit she was diagnosed with fibromyalgia and prescribed Pamelor but she stopped this herself she had lightheadedness of this.  Did not "like the way it makes me feel."  Denies any suicidal/homicidal thoughts with the meds. Has not tried anything OTC for relief of her pain because she read she is not supposed to take anything for fibromyalgia.      Denies any fevers, chills, weight loss or weight gain.    The following portions of the patient's history were reviewed and updated as appropriate: allergies, current medications, past medical history, family and social history, and problem list. Patient is a nonsmoker.    PMH reviewed.  Past Medical History  Diagnosis Date  . Anemia   . UTI (lower  urinary tract infection)   . Back pain   . Hypertension 11/04/2011   Past Surgical History  Procedure Laterality Date  . Cholecystectomy    . Ovarian cyst removal      Medications reviewed. Current Outpatient Prescriptions  Medication Sig Dispense Refill  . cyclobenzaprine (FLEXERIL) 5 MG tablet Take 1 tablet (5 mg total) by mouth 2 (two) times daily.  60 tablet  1  . hydrochlorothiazide (HYDRODIURIL) 25 MG tablet Take 1 tablet (25 mg total) by mouth daily.  30 tablet  3  . nortriptyline (PAMELOR) 10 MG capsule Take 1 capsule (10 mg total) by mouth at bedtime.  30 capsule  1   No current facility-administered medications for this visit.    ROS as above otherwise neg.    Objective:   Physical Exam BP 132/76  Pulse 93  Temp(Src) 99.1 F (37.3 C) (Oral)  Ht 5\' 4"  (1.626 m)  Wt 213 lb (96.616 kg)  BMI 36.54 kg/m2 Gen:  Alert, cooperative patient who appears stated age in no acute distress.  Vital signs reviewed. HEENT: EOMI,  MMM Cardiac:  Regular rate and rhythm without murmur auscultated. Physiologic split S2 with inspiration.  Pulm:  Clear to auscultation bilaterally with good air movement.  No wheezes or rales noted.   Exts: Non edematous BL LE.  Warm  MSK:  Left  ankle without tenderness.  Right ankle with tenderness directly over lateral malleolus.  No joint laxity, ant/post drawer negative.  Eversion/inversion with some pain but no laxity.   Right shoulder with vague trigger point pain.  Left shoulder pain directly over acromion and coracoid process.  Difficulty raising Left arm above her head due to pain.  Left deltoid atrophy noted.  Forward lateral raise also limited by pain.  No redness or swelling to shoulder noted.   Feet:  BL pes planus noted.  TTP at apophysis of Achilles BL. No swelling noted.   Psych: Depressed appearing.  Flat affect.  No crying, not anxious appearing.  Linear and coherent thought process.    No results found for this or any previous visit (from  the past 72 hour(s)).

## 2013-06-17 NOTE — Assessment & Plan Note (Signed)
Seems to be ongoing issue for pain. Unclear exactly how much this new Left shoulder pain is related to chronic pain. Seems more likely that ankle pain is secondary to chronic pain, but see below for details. Not tolerating Pamelor well so therefore plan is to stop this.  To meet PCP for further recommendations at that time.

## 2013-06-17 NOTE — Assessment & Plan Note (Signed)
Secondary to fall.  Persists since that time. Recommend Left shoulder xray to ensure no trauma from fall.   More likely to be Left rotator cuff injury but xray first to find out Toradol shot for relief today Short term course of Diclofenan FU to meet PCP in 1 monht

## 2013-06-21 ENCOUNTER — Telehealth: Payer: Self-pay | Admitting: Family Medicine

## 2013-06-21 NOTE — Telephone Encounter (Signed)
Related message,pt voiced understanding. Dawn Lucas S  

## 2013-06-21 NOTE — Telephone Encounter (Signed)
Please call patient and let her know that she has a bone spur which is likely causing some of her swelling and pain.  The treatment doesn't change.  There is nothing broken, which is very good news!  Thanks, Trey Paula

## 2013-07-09 ENCOUNTER — Encounter: Payer: Self-pay | Admitting: Family Medicine

## 2013-07-09 ENCOUNTER — Ambulatory Visit (INDEPENDENT_AMBULATORY_CARE_PROVIDER_SITE_OTHER): Payer: No Typology Code available for payment source | Admitting: Family Medicine

## 2013-07-09 ENCOUNTER — Ambulatory Visit: Payer: No Typology Code available for payment source | Admitting: Family Medicine

## 2013-07-09 VITALS — BP 144/90 | HR 82 | Temp 99.2°F | Ht 64.0 in | Wt 212.0 lb

## 2013-07-09 DIAGNOSIS — M25519 Pain in unspecified shoulder: Secondary | ICD-10-CM

## 2013-07-09 DIAGNOSIS — I10 Essential (primary) hypertension: Secondary | ICD-10-CM

## 2013-07-09 DIAGNOSIS — M25512 Pain in left shoulder: Secondary | ICD-10-CM

## 2013-07-09 NOTE — Patient Instructions (Addendum)
Hi Ms. Xia. It was a pleasure meeting you today. Today we spoke about your left shoulder pain. We discussed doing wall climb exercises to work out your shoulder. We also spoke about your blood pressure. I know you are not able to to always pay for your medication, but it is important for you to take it every day. Next visit we will discuss your sleep study and your toe numbness. Please follow up with me in one month. If you have any questions, please do not hesitate to call the office.  Jacquelin Hawking, MD

## 2013-07-09 NOTE — Progress Notes (Signed)
  Subjective:    Patient ID: Dawn Lucas, female    DOB: [birth year], 57 y.o.   MRN: [id number]  HPI Comments: Dawn Lucas presents with a complaint of left arm pain. She noticed it hurting in August after she fell down some stairs and caught herself with her left arm. She has felt sharp pain in her shoulder. She was seen 9/11 and was prescribed diclofenac, which has helped. She also had X-rays of her left shoulder and right ankle. She has not been compliant with her blood pressure medication because she is not always able to pay for them. She sometimes misses a week at a time.  X-ray (Right ankle) IMPRESSION: Mild soft tissue swelling.  Inferior calcaneal spur. No fracture.  Mortise intact.  X-ray (left shoulder) IMPRESSION: No shoulder abnormality is identified.     Review of Systems  Musculoskeletal: Positive for myalgias. Negative for joint swelling.       Objective:   Physical Exam  Vitals reviewed. Constitutional: She appears well-developed and well-nourished.  Neck: Normal range of motion.  Cardiovascular: Normal rate, regular rhythm and normal heart sounds.   No murmur heard. Pulmonary/Chest: Effort normal and breath sounds normal. No respiratory distress. She has no wheezes. She has no rales.  Musculoskeletal:       Left shoulder: She exhibits decreased range of motion and tenderness. She exhibits no bony tenderness and no swelling.  Left shoulder: Empty can test was positive. Impingement test was positive.          Assessment & Plan:

## 2013-07-11 NOTE — Assessment & Plan Note (Addendum)
BP slightly elevated today. Dawn Lucas admits having difficulty in the past buying her prescriptions. Encouraged to take her medication as scheduled. Will see her back in one month.

## 2013-07-11 NOTE — Assessment & Plan Note (Signed)
Most likely rotator cuff injury from catching herself from a fall in August. Explained that it may take up to 5-6 months for it to heal and she should continue taking the pain medication she has as needed.

## 2013-07-12 ENCOUNTER — Other Ambulatory Visit: Payer: Self-pay | Admitting: Family Medicine

## 2013-07-12 DIAGNOSIS — N6489 Other specified disorders of breast: Secondary | ICD-10-CM

## 2013-08-17 ENCOUNTER — Ambulatory Visit (INDEPENDENT_AMBULATORY_CARE_PROVIDER_SITE_OTHER): Payer: No Typology Code available for payment source | Admitting: Family Medicine

## 2013-08-17 ENCOUNTER — Encounter: Payer: Self-pay | Admitting: Family Medicine

## 2013-08-17 VITALS — BP 141/69 | HR 93 | Temp 99.5°F | Ht 64.0 in | Wt 212.2 lb

## 2013-08-17 DIAGNOSIS — M791 Myalgia, unspecified site: Secondary | ICD-10-CM

## 2013-08-17 DIAGNOSIS — M797 Fibromyalgia: Secondary | ICD-10-CM

## 2013-08-17 DIAGNOSIS — IMO0001 Reserved for inherently not codable concepts without codable children: Secondary | ICD-10-CM

## 2013-08-17 DIAGNOSIS — I10 Essential (primary) hypertension: Secondary | ICD-10-CM

## 2013-08-17 LAB — CBC WITH DIFFERENTIAL/PLATELET
Basophils Absolute: 0 10*3/uL (ref 0.0–0.1)
Basophils Relative: 0 % (ref 0–1)
Eosinophils Absolute: 0.2 10*3/uL (ref 0.0–0.7)
Eosinophils Relative: 2 % (ref 0–5)
HCT: 37.6 % (ref 36.0–46.0)
Hemoglobin: 12.9 g/dL (ref 12.0–15.0)
Lymphocytes Relative: 42 % (ref 12–46)
Lymphs Abs: 3.9 10*3/uL (ref 0.7–4.0)
MCH: 28.9 pg (ref 26.0–34.0)
MCHC: 34.3 g/dL (ref 30.0–36.0)
MCV: 84.1 fL (ref 78.0–100.0)
Monocytes Absolute: 0.5 10*3/uL (ref 0.1–1.0)
Monocytes Relative: 6 % (ref 3–12)
Neutro Abs: 4.6 10*3/uL (ref 1.7–7.7)
Neutrophils Relative %: 50 % (ref 43–77)
Platelets: 400 10*3/uL (ref 150–400)
RBC: 4.47 MIL/uL (ref 3.87–5.11)
RDW: 14.8 % (ref 11.5–15.5)
WBC: 9.2 10*3/uL (ref 4.0–10.5)

## 2013-08-17 MED ORDER — TRAMADOL HCL 50 MG PO TABS: 50.0000 mg | ORAL_TABLET | Freq: Three times a day (TID) | ORAL | Status: DC | PRN

## 2013-08-17 MED ORDER — DULOXETINE HCL 20 MG PO CPEP: 20.0000 mg | ORAL_CAPSULE | Freq: Every day | ORAL | Status: DC

## 2013-08-17 NOTE — Patient Instructions (Signed)
Dawn Lucas. Today we talked about your pain. I have suggested you get an MRI of your spine because of your pain and weakness. I have gotten some lab work to check for other reasons you may have muscle pain. I will let you know of the results if they are concerning. I have started cymbalta and tramadol for you to take to see if they will help with your muscle pain and weakness. If you start having thoughts of hurting yourself or others, please call the office and stop taking the tramadol. If you have any questions, please call the office.  Sincerely,  Jacquelin Hawking, MD

## 2013-08-18 ENCOUNTER — Other Ambulatory Visit: Payer: Self-pay

## 2013-08-18 LAB — COMPLETE METABOLIC PANEL WITH GFR
ALT: 28 U/L (ref 0–35)
AST: 14 U/L (ref 0–37)
Albumin: 4 g/dL (ref 3.5–5.2)
Alkaline Phosphatase: 83 U/L (ref 39–117)
BUN: 14 mg/dL (ref 6–23)
CO2: 26 mEq/L (ref 19–32)
Calcium: 9.6 mg/dL (ref 8.4–10.5)
Chloride: 104 mEq/L (ref 96–112)
Creat: 1.04 mg/dL (ref 0.50–1.10)
GFR, Est African American: 68 mL/min
GFR, Est Non African American: 59 mL/min — ABNORMAL LOW
Glucose, Bld: 105 mg/dL — ABNORMAL HIGH (ref 70–99)
Potassium: 3.8 mEq/L (ref 3.5–5.3)
Sodium: 145 mEq/L (ref 135–145)
Total Bilirubin: 0.5 mg/dL (ref 0.3–1.2)
Total Protein: 7.3 g/dL (ref 6.0–8.3)

## 2013-08-18 LAB — ANA: Anti Nuclear Antibody(ANA): NEGATIVE

## 2013-08-18 LAB — SEDIMENTATION RATE: Sed Rate: 39 mm/hr — ABNORMAL HIGH (ref 0–22)

## 2013-08-18 LAB — TSH: TSH: 1.101 u[IU]/mL (ref 0.350–4.500)

## 2013-08-18 LAB — RHEUMATOID FACTOR: Rhuematoid fact SerPl-aCnc: <10 IU/mL (ref <=14)

## 2013-08-18 LAB — VITAMIN B12: Vitamin B-12: 459 pg/mL (ref 211–911)

## 2013-08-18 LAB — C-REACTIVE PROTEIN: CRP: 1.7 mg/dL — ABNORMAL HIGH (ref <0.60)

## 2013-08-18 LAB — VITAMIN D 25 HYDROXY (VIT D DEFICIENCY, FRACTURES): Vit D, 25-Hydroxy: 20 ng/mL — ABNORMAL LOW (ref 30–89)

## 2013-08-22 NOTE — Assessment & Plan Note (Signed)
Blood pressure slightly elevated again today. Will continue HCTZ since just borderline.

## 2013-08-22 NOTE — Assessment & Plan Note (Signed)
Patient has long standing history of pain with failure of many treatments. Will try Duloxetine and tramadol and will obtain labs to rule in/out possible other etiologies for pain, including polymyalgia rheumatica.

## 2013-08-22 NOTE — Progress Notes (Signed)
  Subjective:    Patient ID: Dawn Lucas, female    DOB: 1956-05-18, 57 y.o.   MRN: [id number]  HPI Comments: Patient has a long history of back, arm and leg pain. Pain has not responded to previous treatment of flexeril or nortriptyline. Pain is constant with no alleviating or aggravating factors. She has a diagnosis of fibromyalgia but feels like she is misdiagnosed. She also has some right sided lower extremity numbness which is also chronic. ROS is significant for multiple slips when walking down the stairs.     Review of Systems  Respiratory: Negative for shortness of breath.   Musculoskeletal: Positive for arthralgias, myalgias and neck pain. Negative for neck stiffness.       Objective:   Physical Exam  Constitutional: She is oriented to person, place, and time. She appears well-developed and well-nourished. No distress.  Cardiovascular: Normal rate, regular rhythm and normal heart sounds.   Pulmonary/Chest: Effort normal and breath sounds normal. No respiratory distress. She has no wheezes.  Musculoskeletal:       Right shoulder: She exhibits tenderness.       Left shoulder: She exhibits tenderness.       Cervical back: She exhibits tenderness.       Thoracic back: She exhibits tenderness.       Lumbar back: She exhibits tenderness.       Right lower leg: She exhibits tenderness.       Left lower leg: She exhibits tenderness.  Neurological: She is alert and oriented to person, place, and time.  Reflex Scores:      Patellar reflexes are 0 on the right side and 2+ on the left side. Patient has decreased sensation on right lower extremity  Skin: She is not diaphoretic.          Assessment & Plan:

## 2013-09-20 ENCOUNTER — Encounter: Payer: Self-pay | Admitting: Family Medicine

## 2013-09-20 ENCOUNTER — Ambulatory Visit (INDEPENDENT_AMBULATORY_CARE_PROVIDER_SITE_OTHER): Payer: No Typology Code available for payment source | Admitting: Family Medicine

## 2013-09-20 VITALS — BP 139/94 | HR 89 | Temp 98.8°F | Ht 64.0 in | Wt 215.1 lb

## 2013-09-20 DIAGNOSIS — R609 Edema, unspecified: Secondary | ICD-10-CM

## 2013-09-20 DIAGNOSIS — R600 Localized edema: Secondary | ICD-10-CM

## 2013-09-20 DIAGNOSIS — IMO0001 Reserved for inherently not codable concepts without codable children: Secondary | ICD-10-CM

## 2013-09-20 DIAGNOSIS — I10 Essential (primary) hypertension: Secondary | ICD-10-CM

## 2013-09-20 DIAGNOSIS — E559 Vitamin D deficiency, unspecified: Secondary | ICD-10-CM

## 2013-09-20 DIAGNOSIS — M797 Fibromyalgia: Secondary | ICD-10-CM

## 2013-09-20 MED ORDER — PREDNISONE 10 MG PO TABS: 10.0000 mg | ORAL_TABLET | Freq: Every day | ORAL | Status: DC

## 2013-09-20 NOTE — Patient Instructions (Signed)
It was a pleasure seeing you again, Dawn Lucas. Today we discussed starting you on steroids as a trial for your shoulder, neck, back and hip pain. You will take the steroids once every day. Your vitamin D was low, and since we are starting steroids I have increased concern regarding your bones. I would like you to start taking a multivitamin with calcium and vitamin D. [employer] has a One Daily Women's vitamin (1000IU vitamin D and 500mg  calcium). Please take two per day. Please make an appointment to see me in three weeks.

## 2013-09-21 NOTE — Progress Notes (Signed)
Subjective:    Patient ID: Dawn Lucas, female    DOB: January 22, 1956, 57 y.o.   MRN: [id number]  HPI  Chronic pain Dawn Lucas continues to have pain in her shoulders, back, hips and legs. She says that she just copes with the pain as she is unable to afford the pain medication that has been prescribed for her. She says that her tolerance of the pain is good, however she would still like to be with reduced pain.  Lower extremity swelling Dawn Lucas has significant swelling in her legs. She does not do anything to decrease the swelling and her legs have been painful.  Hypertension Dawn Lucas is currently taking her hydrochlorothiazide as prescribed. She reports no headaches, chest pain, shortness of breath or change in vision. She is going to change her diet in the [place] Year to something healthier as she will be doing this with her daughter.  CBC WITH DIFFERENTIAL     Status: None   Collection Time    08/17/13  3:18 PM      Result Value Range   WBC 9.2  4.0 - 10.5 K/uL   RBC 4.47  3.87 - 5.11 MIL/uL   Hemoglobin 12.9  12.0 - 15.0 g/dL   HCT 40.9  81.1 - 91.4 %   MCV 84.1  78.0 - 100.0 fL   MCH 28.9  26.0 - 34.0 pg   MCHC 34.3  30.0 - 36.0 g/dL   RDW 78.2  95.6 - 21.3 %   Platelets 400  150 - 400 K/uL   Neutrophils Relative % 50  43 - 77 %   Neutro Abs 4.6  1.7 - 7.7 K/uL   Lymphocytes Relative 42  12 - 46 %   Lymphs Abs 3.9  0.7 - 4.0 K/uL   Monocytes Relative 6  3 - 12 %   Monocytes Absolute 0.5  0.1 - 1.0 K/uL   Eosinophils Relative 2  0 - 5 %   Eosinophils Absolute 0.2  0.0 - 0.7 K/uL   Basophils Relative 0  0 - 1 %   Basophils Absolute 0.0  0.0 - 0.1 K/uL   Smear Review Criteria for review not met      COMPLETE METABOLIC PANEL WITH GFR     Status: Abnormal   Collection Time    08/17/13  3:18 PM      Result Value Range   Sodium 145  135 - 145 mEq/L   Potassium 3.8  3.5 - 5.3 mEq/L   Chloride 104  96 - 112 mEq/L   CO2 26  19 - 32 mEq/L   Glucose, Bld 105 (*) 70 - 99  mg/dL   BUN 14  6 - 23 mg/dL   Creat 0.86  5.78 - 4.69 mg/dL   Total Bilirubin 0.5  0.3 - 1.2 mg/dL   Alkaline Phosphatase 83  39 - 117 U/L   AST 14  0 - 37 U/L   ALT 28  0 - 35 U/L   Total Protein 7.3  6.0 - 8.3 g/dL   Albumin 4.0  3.5 - 5.2 g/dL   Calcium 9.6  8.4 - 62.9 mg/dL   GFR, Est African American 68     GFR, Est Non African American 59 (*)           RHEUMATOID FACTOR     Status: None   Collection Time    08/17/13  3:18 PM      Result Value Range  Rheumatoid Factor <10  <=14 IU/mL   Comment:                                Interpretive Table                         Low Positive: 15 - 41 IU/mL                         High Positive:  >= 42 IU/mL    TSH     Status: None   Collection Time    08/17/13  3:18 PM      Result Value Range   TSH 1.101  0.350 - 4.500 uIU/mL    ANA     Status: None   Collection Time    08/17/13  3:18 PM      Result Value Range   ANA NEG  NEGATIVE    SEDIMENTATION RATE     Status: Abnormal   Collection Time    08/17/13  3:18 PM      Result Value Range   Sed Rate 39 (*) 0 - 22 mm/hr    C-REACTIVE PROTEIN     Status: Abnormal   Collection Time    08/17/13  3:18 PM      Result Value Range   CRP 1.7 (*) <0.60 mg/dL    VITAMIN E45     Status: None   Collection Time    08/17/13  3:18 PM      Result Value Range   Vitamin B-12 459  211 - 911 pg/mL    VITAMIN D 25 HYDROXY     Status: Abnormal   Collection Time    08/17/13  3:18 PM      Result Value Range   Vit D, 25-Hydroxy 20 (*) 30 - 89 ng/mL    Review of Systems  Neurological: Negative for syncope and headaches.       Objective:   Physical Exam  Constitutional: She is oriented to person, place, and time. She appears well-developed and well-nourished.  Cardiovascular: Normal rate, regular rhythm and normal heart sounds.   Manual BP: 112/80  Pulmonary/Chest: Effort normal and breath sounds normal.  Musculoskeletal:       Left shoulder: She exhibits decreased range of  motion.       Right ankle: She exhibits swelling. Tenderness.       Left ankle: She exhibits swelling. Tenderness.       Cervical back: She exhibits tenderness.       Thoracic back: She exhibits tenderness.       Lumbar back: She exhibits tenderness.  Neurological: She is alert and oriented to person, place, and time.          Assessment & Plan:

## 2013-09-22 DIAGNOSIS — E559 Vitamin D deficiency, unspecified: Secondary | ICD-10-CM | POA: Insufficient documentation

## 2013-09-22 DIAGNOSIS — R600 Localized edema: Secondary | ICD-10-CM | POA: Insufficient documentation

## 2013-09-22 NOTE — Assessment & Plan Note (Addendum)
Work up for possible rheumatoid cause yielded high CRP and ESR. My suspicion includes possible Polymyalgia rheumatica. Will try trial of daily steroids and assess for improvement of symptoms, which would be suggestive of Polymyalgia rheumatica diagnosis when including results of labs. Will follow-up in three weeks.

## 2013-09-22 NOTE — Assessment & Plan Note (Signed)
Will continue current regimen

## 2013-10-14 ENCOUNTER — Other Ambulatory Visit: Payer: Self-pay | Admitting: Family Medicine

## 2013-10-14 DIAGNOSIS — N6489 Other specified disorders of breast: Secondary | ICD-10-CM

## 2013-10-18 ENCOUNTER — Ambulatory Visit
Admission: RE | Admit: 2013-10-18 | Discharge: 2013-10-18 | Disposition: A | Discharge status: Home / Self Care | Payer: No Typology Code available for payment source | Source: Ambulatory Visit | Attending: Family Medicine | Admitting: Family Medicine

## 2013-10-18 ENCOUNTER — Ambulatory Visit (INDEPENDENT_AMBULATORY_CARE_PROVIDER_SITE_OTHER): Payer: No Typology Code available for payment source | Admitting: Family Medicine

## 2013-10-18 ENCOUNTER — Encounter: Payer: Self-pay | Admitting: Family Medicine

## 2013-10-18 VITALS — BP 115/72 | HR 92 | Temp 98.3°F | Ht 64.0 in | Wt 209.0 lb

## 2013-10-18 DIAGNOSIS — N6489 Other specified disorders of breast: Secondary | ICD-10-CM

## 2013-10-18 DIAGNOSIS — I10 Essential (primary) hypertension: Secondary | ICD-10-CM

## 2013-10-18 DIAGNOSIS — R52 Pain, unspecified: Secondary | ICD-10-CM

## 2013-10-18 MED ORDER — PREDNISONE 5 MG PO TABS: 15.0000 mg | ORAL_TABLET | Freq: Every day | ORAL | Status: DC

## 2013-10-18 NOTE — Progress Notes (Signed)
   Subjective:    Patient ID: Dawn Lucas, female    DOB: 1956-04-28, 58 y.o.   MRN: [id number]  HPI  Hypertension Patient adherent to medication regimen. She is currently taking hydrochlorothiazide. She reports no side effects. She is not having any shortness of breath. She has chest pain when she is lying on her arms at night bilaterally that begins in arms but not otherwise.  Generalized pain Patient has seen no improvement in pain. She did not start therapy because her pharmacy told her the steroid medication was not available and was never prescribed. Patient is willing to try therapy again today. She currently tolerates and has gotten used to the pain but still wants treatment. Patient has significant stressors in her life, including her great grand nieces. She enjoys reading books, which allows her to escape from her stress.   Review of Systems Refer to HPI    Objective:   Physical Exam  Vitals reviewed. Constitutional: She appears well-developed and well-nourished.  Cardiovascular: Normal rate, regular rhythm and normal heart sounds.   Pulmonary/Chest: Effort normal and breath sounds normal.  Musculoskeletal:       Right shoulder: She exhibits tenderness.       Left shoulder: She exhibits tenderness.       Thoracic back: She exhibits tenderness.          Assessment & Plan:

## 2013-10-18 NOTE — Patient Instructions (Addendum)
[name], it was a pleasure seeing you today. Today we talked about your general pain. Since you were not able to get the medication, we will try the therapy today. You will take prednisone 15mg  (three 5mg  pills) every day and we will see if you see improvement in the next week. Please make an appointment to see me again in four weeks. Please call me in one week regarding your progress.  If you have any questions or concerns, please do not hesitate to call the office at [phone].  Sincerely,  Cordelia Poche, MD   Polymyalgia Rheumatica Polymyalgia rheumatica (also called PMR or polymyalgia) is a rheumatologic (arthritic) condition that causes pain and morning stiffness in your neck, shoulders, and hips. It is an inflammatory condition. In some people, inflammation of certain structures in the shoulder, hips, or other joints can be seen on special testing. It does not cause joint destruction, as occurs in other arthritic conditions. It usually occurs after 59 years of age, and is more common as you age. It can be confused with several other diseases, but it is usually easily treated. People with PMR often have, or can develop, a more severe rheumatologic condition called giant cell arteritis (also called CGA or temporal arteritis).  CAUSES  The exact cause of PMR is not known.   There are genetic factors involved.  Viruses have been suspected in the cause of PMR. This has not been proven. SYMPTOMS   Aching, pain, and morning stiffness your neck, both shoulders, or both hips.  Symptoms usually start slowly and build gradually.  Morning stiffness usually lasts at least 30 minutes.  Swelling and tenderness in other joints of the arms, hands, legs, and feet may occur.  Swelling and inflammation in the wrists can cause nerve inflammation at the wrist (carpal tunnel syndrome).  You may also have low grade fever, fatigue, weakness, decreased appetite and weight loss. DIAGNOSIS    Your caregiver may suspect that you have PMR based on your description of your symptoms and on your exam.  Your caregiver will examine you to be sure you do not have diseases that can be confused with PMR. These diseases include rheumatoid arthritis, fibromyalgia, or thyroid disease.  Your caregiver should check for signs of giant cell arteritis. This can cause serious complications such as blindness.  Lab tests can help confirm that you have PMR and not other diseases, but are sometimes inconclusive.  X-rays cannot show PMR. However, it can identify other diseases like rheumatoid arthritis. Your caregiver may have you see a specialist in arthritis and inflammatory diseases (rheumatologist). TREATMENT  The goal of treatment is relief of symptoms. Treatment does not shorten the course of the illness or prevent complications. With proper treatment, you usually feel better almost right away.   The initial treatment of PMR is usually a cortisone (steroid) medication. Your caregiver will help determine a starting dose. The dose is gradually reduced every few weeks to months. Treatment usually lasts one to three years.  Other stronger medications are rarely needed. They will only be prescribed if your symptoms do not get better on cortisone medication alone, or if they recur as the dose is reduced.  Cortisone medication can have different side effects. With the doses of cortisone needed for PMR, the side effects can affect bones and joints, blood sugar control in diabetes, and mood changes. Discuss this with your caregiver.  Your caregiver will evaluate you regularly during your treatment. They will do this in order  to assess progress and to check for complications of the illness or treatment.  Physical therapy is sometimes useful. This is especially true if your joints are still stiff after other symptoms have improved. HOME CARE INSTRUCTIONS   Follow your caregiver's instructions. Do not  change your dose of cortisone medication on your own.  Keep your appointments for follow-up lab tests and caregiver visits. Your lab tests need to be monitored. You must get checked periodically for giant cell arteritis.  Follow your caregiver's guidance regarding physical activity (usually no restrictions are needed) or physical therapy.  Your caregiver may have instructions to prevent or check for side effects from cortisone medication (including bone density testing or treatment). Follow their instructions carefully. SEEK MEDICAL CARE IF:   You develop any side effects from treatment. Side effects can include:  Elevated blood pressure.  High blood sugar (or worsening of diabetes, if you are diabetic).  Difficulty fighting off infections.  Weight gain.  Weakness of the bones (osteoporosis).  Your aches, pains, morning stiffness, or other symptoms get worse with time. This is especially true after your dose of cortisone is reduced.  You develop new joint symptoms (pain, swelling, etc.) SEEK IMMEDIATE MEDICAL CARE IF:   You develop a severe headache.  You start vomiting.  You have problems with your vision.  You have an oral temperature above 102 F (38.9 C), not controlled by medicine. Document Released: 10/31/2004 Document Revised: 09/09/2012 Document Reviewed: 02/13/2009 [facility] Patient Information 2014 [place], [place].

## 2013-10-19 ENCOUNTER — Other Ambulatory Visit: Payer: Self-pay | Admitting: Family Medicine

## 2013-10-19 ENCOUNTER — Encounter: Payer: Self-pay | Admitting: Family Medicine

## 2013-10-19 NOTE — Assessment & Plan Note (Signed)
Patient did not start treatment. Spoke with today and she is willing to start treatment now as she will have the funds to afford her medication. Will follow-up in 2 weeks via telephone to assess if any improvement on therapy. Will follow-up in 4 weeks since patient is not able to afford more frequent appointments.

## 2013-10-20 NOTE — Assessment & Plan Note (Signed)
Blood pressure controlled. Will continue HCTZ.

## 2013-10-28 ENCOUNTER — Telehealth: Payer: Self-pay | Admitting: Family Medicine

## 2013-10-28 NOTE — Telephone Encounter (Signed)
Pt called at the request of Dr. Teryl Lucy. She wanted him to know that the Prednisone is helping the pain about 80% of the time, but the down side is that this makes her sleepy during the day and then she cant sleep at night. jw

## 2013-10-28 NOTE — Telephone Encounter (Signed)
Please advise. Dawn Lucas S  

## 2013-11-03 MED ORDER — PREDNISONE 5 MG PO TABS
15.0000 mg | ORAL_TABLET | Freq: Every day | ORAL | Status: DC
Start: 2013-11-03 — End: 2013-12-14

## 2013-11-03 NOTE — Telephone Encounter (Signed)
Called patient and patient has significant improvement in pain while on steroids. Will continue treatment and have refilled prednisone. Patient has been made aware.

## 2013-12-06 ENCOUNTER — Ambulatory Visit: Payer: No Typology Code available for payment source | Admitting: Family Medicine

## 2013-12-14 ENCOUNTER — Encounter: Payer: Self-pay | Admitting: Family Medicine

## 2013-12-14 ENCOUNTER — Ambulatory Visit (INDEPENDENT_AMBULATORY_CARE_PROVIDER_SITE_OTHER): Payer: No Typology Code available for payment source | Admitting: Family Medicine

## 2013-12-14 VITALS — BP 130/80 | Temp 98.5°F | Ht 64.0 in | Wt 216.0 lb

## 2013-12-14 DIAGNOSIS — I10 Essential (primary) hypertension: Secondary | ICD-10-CM

## 2013-12-14 DIAGNOSIS — Z0000 Encounter for general adult medical examination without abnormal findings: Secondary | ICD-10-CM

## 2013-12-14 DIAGNOSIS — R52 Pain, unspecified: Secondary | ICD-10-CM

## 2013-12-14 LAB — POCT GLYCOSYLATED HEMOGLOBIN (HGB A1C): Hemoglobin A1C: 6.6

## 2013-12-14 MED ORDER — HYDROCHLOROTHIAZIDE 25 MG PO TABS: ORAL_TABLET | ORAL | Status: DC

## 2013-12-14 MED ORDER — PREDNISONE 5 MG PO TABS: 15.0000 mg | ORAL_TABLET | Freq: Every day | ORAL | Status: DC

## 2013-12-14 NOTE — Progress Notes (Signed)
   Subjective:    Patient ID: Dawn Lucas, female    DOB: Apr 02, 1956, 58 y.o.   MRN: [id number]  HPI  Generalized pain Patient presents with pain everywhere. She had some improvement while taking prednisone but says the medication has stopped working and her body has gotten used to the medication. Her pain affects her sleep and she now gets headaches. No change in vision. Her pain has been worse since the weather has been cold.  Hypertension Patient adherent to medication regimen. She is currently taking hydrochlorothiazide. She reports no side effects. She is not having any shortness of breath. She has chest pain that is musculoskeletal and unchanged.  Review of Systems Polydipsia, polyuria. No dysuria    Objective:   Physical Exam  Vitals reviewed. Constitutional: She is oriented to person, place, and time. She appears well-developed and well-nourished.  Cardiovascular: Normal rate, regular rhythm and normal heart sounds.   Pulmonary/Chest: Effort normal and breath sounds normal. No respiratory distress. She has no wheezes. She exhibits tenderness.  Abdominal: Soft. There is no tenderness.  Musculoskeletal:       Right shoulder: She exhibits tenderness.       Left shoulder: She exhibits tenderness.       Right hip: She exhibits tenderness.       Left hip: She exhibits tenderness.       Cervical back: She exhibits tenderness.       Thoracic back: She exhibits tenderness.  Neurological: She is alert and oriented to person, place, and time.       Assessment & Plan:

## 2013-12-14 NOTE — Assessment & Plan Note (Signed)
Blood pressure at goal. Will refill HCTZ.

## 2013-12-14 NOTE — Assessment & Plan Note (Signed)
Patient initially responded to steroids. Would like to increase to prednisone 20mg  daily, but patient does not want to agree with that plan. She is wanting answers for her pain. Will refill prednisone 15mg  and have patient follow-up in 4 weeks.

## 2013-12-14 NOTE — Patient Instructions (Signed)
Dawn Lucas, it was a pleasure seeing you today. Today we talked about your pain and your blood pressure. I am getting a hemoglobin A1C to check your sugar status. I will let you know if it is abnormal. We will continue the steroids. Please schedule an appointment to be seen in 4 weeks for a physical exam, including a pap smear. I am refilling your steroids and hydrochlorothiazide.   If you have any questions or concerns, please do not hesitate to call the office at [phone].  Sincerely,  Cordelia Poche, MD

## 2014-01-28 ENCOUNTER — Encounter: Payer: No Typology Code available for payment source | Admitting: Family Medicine

## 2014-02-15 ENCOUNTER — Encounter: Payer: Self-pay | Admitting: Family Medicine

## 2014-02-15 ENCOUNTER — Other Ambulatory Visit (HOSPITAL_COMMUNITY)
Admission: RE | Admit: 2014-02-15 | Discharge: 2014-02-15 | Disposition: A | Discharge status: Home / Self Care | Payer: No Typology Code available for payment source | Source: Ambulatory Visit | Attending: Sports Medicine | Admitting: Sports Medicine

## 2014-02-15 ENCOUNTER — Other Ambulatory Visit (HOSPITAL_COMMUNITY)
Admission: RE | Admit: 2014-02-15 | Discharge: 2014-02-15 | Disposition: A | Discharge status: Home / Self Care | Payer: No Typology Code available for payment source | Source: Ambulatory Visit | Attending: Family Medicine | Admitting: Family Medicine

## 2014-02-15 ENCOUNTER — Ambulatory Visit (INDEPENDENT_AMBULATORY_CARE_PROVIDER_SITE_OTHER): Payer: No Typology Code available for payment source | Admitting: Family Medicine

## 2014-02-15 VITALS — BP 120/79 | HR 69 | Temp 99.0°F | Ht 64.0 in | Wt 215.4 lb

## 2014-02-15 DIAGNOSIS — Z124 Encounter for screening for malignant neoplasm of cervix: Secondary | ICD-10-CM

## 2014-02-15 DIAGNOSIS — Z1151 Encounter for screening for human papillomavirus (HPV): Secondary | ICD-10-CM | POA: Insufficient documentation

## 2014-02-15 DIAGNOSIS — N760 Acute vaginitis: Secondary | ICD-10-CM

## 2014-02-15 DIAGNOSIS — Z01419 Encounter for gynecological examination (general) (routine) without abnormal findings: Secondary | ICD-10-CM | POA: Insufficient documentation

## 2014-02-15 DIAGNOSIS — Z113 Encounter for screening for infections with a predominantly sexual mode of transmission: Secondary | ICD-10-CM | POA: Insufficient documentation

## 2014-02-15 NOTE — Patient Instructions (Signed)
Dawn Lucas, it was a pleasure seeing you today. Today we did your yearly physical. We did a pap smear to check for cervical cancer.  Please make an appointment to see me in 2-4 weeks to discuss your chronic issues.  If you have any questions or concerns, please do not hesitate to call the office at [phone].  Sincerely,  Cordelia Poche, MD

## 2014-02-15 NOTE — Progress Notes (Signed)
  Subjective:     Dawn Lucas is a 58 y.o. female and is here for a comprehensive physical exam. The patient reports problems - generalized pain.  History   Social History  . Marital Status: Single    Spouse Name: N/A    Number of Children: N/A  . Years of Education: N/A   Occupational History  . Not on file.   Social History Main Topics  . Smoking status: Current Every Day Smoker -- 0.30 packs/day for 45 years    Types: Cigarettes  . Smokeless tobacco: Not on file  . Alcohol Use: Yes  . Drug Use: No  . Sexual Activity: Not Currently   Other Topics Concern  . Not on file   Social History Narrative  . No narrative on file   Health Maintenance  Topic Date Due  . Pap Smear  01/13/1973  . Colonoscopy  11/17/2015 (Originally 01/13/2005)  . Colon Cancer Screening Annual Fobt  03/26/2014  . Influenza Vaccine  05/07/2014  . Mammogram  10/19/2015  . Tetanus/tdap  08/21/2021    The following portions of the patient's history were reviewed and updated as appropriate: allergies, current medications, past family history, past medical history, past social history, past surgical history and problem list.  Review of Systems Constitutional: negative Eyes: positive for visual disturbance Ears, nose, mouth, throat, and face: negative Respiratory: negative Cardiovascular: positive for chest pain and extremity swelling Gastrointestinal: positive for abdominal pain, constipation, nausea and vomiting Genitourinary:positive for frequency and urinary incontinence Musculoskeletal:positive for arthralgias and myalgias Neurological: positive for headaches    Objective:    BP 120/79  Pulse 69  Temp(Src) 99 F (37.2 C) (Oral)  Ht 5\' 4"  (1.626 m)  Wt 215 lb 6.4 oz (97.705 kg)  BMI 36.96 kg/m2 General appearance: alert and appears stated age Head: Normocephalic, without obvious abnormality, atraumatic Eyes: negative findings: conjunctivae and sclerae normal and pupils equal, round,  reactive to light and accomodation Neck: no adenopathy, supple, symmetrical, trachea midline and thyroid not enlarged, symmetric, no tenderness/mass/nodules Lungs: clear to auscultation bilaterally Heart: regular rate and rhythm, S1, S2 normal, no murmur, click, rub or gallop Abdomen: normal findings: no bruits heard, no organomegaly and soft and abnormal findings:  diffuse tenderness Pelvic: cervix normal in appearance, external genitalia normal, uterus normal size, shape, and consistency and vagina normal without discharge Extremities: extremities normal, atraumatic, no cyanosis or edema Pulses: 2+ and symmetric Neurologic: Grossly normal    Assessment:    Healthy female exam. General tenderness throughout body.      Plan:   Generalized pain a chronic issue. Something I am managing. Will follow-up at future visit.   See After Visit Summary for Counseling Recommendations

## 2014-02-16 LAB — CERVICOVAGINAL ANCILLARY ONLY
Chlamydia: NEGATIVE
Neisseria Gonorrhea: NEGATIVE

## 2014-02-17 ENCOUNTER — Telehealth: Payer: Self-pay | Admitting: *Deleted

## 2014-02-17 ENCOUNTER — Encounter: Payer: Self-pay | Admitting: Family Medicine

## 2014-02-17 NOTE — Telephone Encounter (Signed)
Message copied by Johny Shears on Thu Feb 17, 2014  2:20 PM ------      Message from: Cordelia Poche A      Created: Thu Feb 17, 2014 12:17 PM       Please inform patient that pap smear, gonorrhea and chlamydia are negative. She will receive a report in the mail. ------

## 2014-02-17 NOTE — Telephone Encounter (Signed)
LVM for patient to call back to give below information.

## 2014-02-18 NOTE — Telephone Encounter (Signed)
Left message on patient's voicemail.Bowman Higbie S Lillyana Majette  

## 2014-03-18 ENCOUNTER — Ambulatory Visit (INDEPENDENT_AMBULATORY_CARE_PROVIDER_SITE_OTHER): Payer: No Typology Code available for payment source | Admitting: Family Medicine

## 2014-03-18 VITALS — BP 110/78 | HR 77 | Temp 99.3°F | Resp 18 | Wt 217.0 lb

## 2014-03-18 DIAGNOSIS — M25519 Pain in unspecified shoulder: Secondary | ICD-10-CM

## 2014-03-18 DIAGNOSIS — M25512 Pain in left shoulder: Secondary | ICD-10-CM

## 2014-03-18 DIAGNOSIS — E119 Type 2 diabetes mellitus without complications: Secondary | ICD-10-CM

## 2014-03-18 LAB — POCT GLYCOSYLATED HEMOGLOBIN (HGB A1C): Hemoglobin A1C: 6.2

## 2014-03-18 MED ORDER — IBUPROFEN 600 MG PO TABS: 600.0000 mg | ORAL_TABLET | Freq: Three times a day (TID) | ORAL | Status: DC | PRN

## 2014-03-18 MED ORDER — NORTRIPTYLINE HCL 25 MG PO CAPS: 25.0000 mg | ORAL_CAPSULE | Freq: Every day | ORAL | Status: DC

## 2014-03-18 MED ORDER — CYCLOBENZAPRINE HCL 10 MG PO TABS: 10.0000 mg | ORAL_TABLET | Freq: Three times a day (TID) | ORAL | Status: DC

## 2014-03-18 MED ORDER — CYCLOBENZAPRINE HCL 10 MG PO TABS: 10.0000 mg | ORAL_TABLET | Freq: Every day | ORAL | Status: DC

## 2014-03-18 NOTE — Patient Instructions (Signed)
Dawn Lucas, it was a pleasure seeing you today. Today we talked about your shoulder pain. I am going to refer you to physical therapy and sports medicine. Please use ice and apply directly to the area that is giving you pain. You can also use heat on your neck to relax your muscles. I will prescribe a muscle relaxer that you can take to hopefully help you have greater use of your muscles. I will also prescribe you ibuprofen for pain.  Please schedule an appointment in 4 weeks to see me.  If you have any questions or concerns, please do not hesitate to call the office at [phone].  Sincerely,  Cordelia Poche, MD

## 2014-03-22 NOTE — Progress Notes (Signed)
   Subjective:    Patient ID: Rosey Eide, female    DOB: [birth year], 58 y.o.   MRN: [id number]  HPI  Patient presents with left shoulder pain which has been persistent since a about 9 months after having a fall and catching herself. Initial x-ray did not reveal pathology. She has been trying to deal with the pain but it continues to bother her and affect her ability to use her left arm. She has most trouble raising it over her head.  Review of Systems  Musculoskeletal: Positive for arthralgias, back pain, myalgias and neck pain. Negative for gait problem.  All other systems reviewed and are negative.      Objective:   Physical Exam  Constitutional: She is oriented to person, place, and time. She appears well-developed and well-nourished.  Cardiovascular: Normal rate, regular rhythm and normal heart sounds.   Pulmonary/Chest: Effort normal and breath sounds normal.  Musculoskeletal:       Left shoulder: She exhibits decreased range of motion and tenderness (point tenderness over acromion). She exhibits normal strength.  Left arm: Lift off test: negative Internal rotation: negative External rotation: negative Empty can: negative Neer's: negative with moderate pain  Pain elicited with all maneuvers  Neurological: She is alert and oriented to person, place, and time.  Skin: Skin is warm and dry.         Assessment & Plan:

## 2014-03-22 NOTE — Assessment & Plan Note (Signed)
Does not appear to have tear. Supportive treatment with ice and heat. Will prescribe muscle relaxer and ibuprofen for pain. Will set up referral for physical therapy and sports medicine follow-up.

## 2014-04-21 ENCOUNTER — Encounter: Payer: Self-pay | Admitting: Sports Medicine

## 2014-04-21 ENCOUNTER — Ambulatory Visit (INDEPENDENT_AMBULATORY_CARE_PROVIDER_SITE_OTHER): Payer: No Typology Code available for payment source | Admitting: Sports Medicine

## 2014-04-21 ENCOUNTER — Ambulatory Visit: Payer: No Typology Code available for payment source | Attending: Family Medicine | Admitting: Physical Therapy

## 2014-04-21 VITALS — BP 136/63 | Ht 64.0 in | Wt 217.0 lb

## 2014-04-21 DIAGNOSIS — R5381 Other malaise: Secondary | ICD-10-CM | POA: Insufficient documentation

## 2014-04-21 DIAGNOSIS — R293 Abnormal posture: Secondary | ICD-10-CM | POA: Insufficient documentation

## 2014-04-21 DIAGNOSIS — M25519 Pain in unspecified shoulder: Secondary | ICD-10-CM

## 2014-04-21 DIAGNOSIS — M6281 Muscle weakness (generalized): Secondary | ICD-10-CM | POA: Insufficient documentation

## 2014-04-21 DIAGNOSIS — M25512 Pain in left shoulder: Secondary | ICD-10-CM

## 2014-04-21 MED ORDER — METHYLPREDNISOLONE ACETATE 40 MG/ML IJ SUSP
40.0000 mg | Freq: Once | INTRAMUSCULAR | Status: AC
  Administered 2014-04-21: 40 mg via INTRA_ARTICULAR

## 2014-04-21 NOTE — Patient Instructions (Signed)
Please follow up in 8wks

## 2014-04-21 NOTE — Progress Notes (Signed)
  Dawn Lucas - 58 y.o. female MRN [id number]  Date of birth: 06/20/1956  SUBJECTIVE:  Including CC & ROS.  Chief complaint: Left shoulder pain  Dawn Lucas is a 58 year old African American female who presents today complaining of left shoulder pain. Patient reports that it has been intermittent for the past 7 months after falling. Reports falling down a set of stairs and grabbing onto the railing to prevent herself from falling and thus has had a lot of pain in that shoulder from the pulling of her arm. She's been seen by her PCP who recommended range of motion exercises and referral to physical therapy. She reported that her first physical therapy appointments today and it went well. She also complains of an aching throbbing pain throughout the shoulder joint and loss of range of motion. She uses Flexeril intermittently for pain control and over-the-counter anti-inflammatories. Pain is worse with overhead activities and activities that require external rotation. She also finds it difficult to sleep on this particular left shoulder.   ROS: Review of systems otherwise negative except for information present in HPI  HISTORY: Past Medical, Surgical, Social, and Family History Reviewed & Updated per EMR. Pertinent Historical Findings include: Obesity, tobacco abuse, history of chronic back bilateral knee and lower extremity pain  DATA REVIEWED: 06/17/13: Personally reviewed left shoulder 2 view. Patient is adequate alignment and joint space is preserved. No evidence of calcification or significant arthritic changes 07/04/11: Personally reviewed 4 view of the cervical spine. Vertebral body height and alignment within normal limits. Adequate height of intervertebral disc space. Mild arthritic changes in the facets.  PHYSICAL EXAM:  VS: BP:136/63 mmHg  HR: bpm  TEMP: ( )  RESP:   HT:5\' 4"  (162.6 cm)   WT:217 lb (98.431 kg)  BMI:37.3 PHYSICAL EXAM: SHOULDER EXAM:  General: well nourished Skin of  UE: warm; dry, no rashes, lesions, ecchymosis or erythema. Vascular: radial pulses 2+ bilaterally Neurologically: Sensation to light touch upper extremities equal and intact bilaterally.  Normal sensation with no sensory or motor defects in C4-C8 Palpation: no tenderness over the [facility] joint, acromion, yes bicipital grove tenderness, yes supraspinatus tenderness ROM active/passive: reduced left sided ROM 0-100 degree of abduction and forward flexion, reduced left-sided internal and external rotation 0-50 abduction. Appley's scratch test reduced on the left vs. The right Strength testing: reduced left sided strength 4/5 in internal and external rotation, forward flexion, adduction and abduction compared to the right     Special Test: positive Neer's, + Hawkins, + empty can, neg O'Brien, + speeds, neg apprehension  Negative bilateral Spurling's test  Consent obtained and verified. Sterile betadine prep. Furthur cleansed with alcohol. Topical analgesic spray: Ethyl chloride. Joint: Left subacromial and intra-articular space Approached in typical fashion with: Posterior lateral approach Completed without difficulty Meds: 4 cc of Xylocaine without epinephrine, 1 cc of 40 mg Medrol Needle: Aftercare instructions and Red flags advised.  ASSESSMENT & PLAN: See problem based charting & AVS for pt instructions.

## 2014-04-22 NOTE — Assessment & Plan Note (Signed)
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 She has improve symptoms and function she can cancel her follow component.

## 2014-05-16 ENCOUNTER — Ambulatory Visit: Payer: No Typology Code available for payment source | Attending: Family Medicine | Admitting: Rehabilitation

## 2014-05-16 DIAGNOSIS — M25519 Pain in unspecified shoulder: Secondary | ICD-10-CM | POA: Insufficient documentation

## 2014-05-16 DIAGNOSIS — R293 Abnormal posture: Secondary | ICD-10-CM | POA: Insufficient documentation

## 2014-05-16 DIAGNOSIS — M6281 Muscle weakness (generalized): Secondary | ICD-10-CM | POA: Insufficient documentation

## 2014-05-16 DIAGNOSIS — R5381 Other malaise: Secondary | ICD-10-CM | POA: Insufficient documentation

## 2014-05-17 ENCOUNTER — Ambulatory Visit (INDEPENDENT_AMBULATORY_CARE_PROVIDER_SITE_OTHER): Payer: No Typology Code available for payment source | Admitting: Family Medicine

## 2014-05-17 ENCOUNTER — Encounter: Payer: Self-pay | Admitting: Family Medicine

## 2014-05-17 VITALS — BP 130/70 | HR 74 | Temp 98.5°F | Wt 215.0 lb

## 2014-05-17 DIAGNOSIS — R52 Pain, unspecified: Secondary | ICD-10-CM

## 2014-05-17 DIAGNOSIS — R51 Headache: Secondary | ICD-10-CM | POA: Insufficient documentation

## 2014-05-17 DIAGNOSIS — I10 Essential (primary) hypertension: Secondary | ICD-10-CM

## 2014-05-17 DIAGNOSIS — G479 Sleep disorder, unspecified: Secondary | ICD-10-CM

## 2014-05-17 DIAGNOSIS — R519 Headache, unspecified: Secondary | ICD-10-CM | POA: Insufficient documentation

## 2014-05-17 NOTE — Assessment & Plan Note (Signed)
Blood pressure controlled.  Continue current regimen. ?

## 2014-05-17 NOTE — Progress Notes (Signed)
    Subjective  Infant Donahoe is a 58 y.o. female that presents for a follow-up visit for chronic issues.  1. Chronic pain: Remains constant. Shoulder pain improved with steroid injections and physical therapy. Pain still in hips, back mostly. Patient still refuses to restart steroid therapy.  2. Headache: Has had these for at least a year but has worsened a week ago. Throbbing pain. Has nausea and vomiting during headaches. Used to occur every 3 or so days. Excedrin PM used to help with headaches. Now she just lays down and waits for them to go away. She does not currently take any medication. No LOC. No dizziness or lightheadedness.  3. Insomnia: Sleeps with the TV at times. Other times, she does not have the TV on. Says pain interferes with sleep. Drinks alcohol occasionally.  4. Hypertension: adherent to medication regimen. No complaints.  History  Substance Use Topics  . Smoking status: Current Every Day Smoker -- 0.30 packs/day for 45 years    Types: Cigarettes  . Smokeless tobacco: Not on file  . Alcohol Use: No   ROS Per HPI  Objective  BP 130/70  Pulse 74  Temp(Src) 98.5 F (36.9 C) (Oral)  Wt 215 lb (97.523 kg)  Gen: No acute distress Eyes: PERRL  Assessment and Plan   Please refer to problem based charting of assessment and plan

## 2014-05-17 NOTE — Patient Instructions (Addendum)
Dawn Lucas, it was a pleasure seeing you today. Today we talked about your chronic medical issues.   I'm glad your shoulder is feeling better. Regarding your pain, although I think the steroids will help the most, since you are worried about weight gain, we will hold off on treatment. Regarding your sleep, I will give you some sleep hygiene information.  Please see me again in 3 month for a physical exam  If you have any questions or concerns, please do not hesitate to call the office at [phone].  Sincerely,  Cordelia Poche, MD

## 2014-05-17 NOTE — Assessment & Plan Note (Signed)
Possible migraines. Patient states Excedrin worked in the past. Recommended restarting medication. Do not suspect intracranial process.

## 2014-05-17 NOTE — Assessment & Plan Note (Signed)
Patient continues to not want to restart steroid therapy because of weight concerns. Would rather deal with pain than start therapy. Will continue to follow.

## 2014-05-17 NOTE — Assessment & Plan Note (Signed)
Patient not interested in using medication I've prescribed. Nortriptyline prescribed but she has not used it. Will continue to follow.

## 2014-05-18 ENCOUNTER — Encounter: Payer: Self-pay | Admitting: Rehabilitation

## 2014-05-23 ENCOUNTER — Ambulatory Visit: Payer: No Typology Code available for payment source | Admitting: Physical Therapy

## 2014-05-25 ENCOUNTER — Encounter: Payer: Self-pay | Admitting: Rehabilitation

## 2014-06-01 ENCOUNTER — Ambulatory Visit: Payer: No Typology Code available for payment source | Admitting: Physical Therapy

## 2014-06-16 ENCOUNTER — Ambulatory Visit: Payer: No Typology Code available for payment source | Attending: Family Medicine | Admitting: Physical Therapy

## 2014-06-16 ENCOUNTER — Encounter: Payer: Self-pay | Admitting: Sports Medicine

## 2014-06-16 ENCOUNTER — Ambulatory Visit (INDEPENDENT_AMBULATORY_CARE_PROVIDER_SITE_OTHER): Payer: Self-pay | Admitting: Sports Medicine

## 2014-06-16 VITALS — BP 141/83 | Ht 64.0 in | Wt 217.0 lb

## 2014-06-16 DIAGNOSIS — M25519 Pain in unspecified shoulder: Secondary | ICD-10-CM

## 2014-06-16 DIAGNOSIS — M25512 Pain in left shoulder: Secondary | ICD-10-CM

## 2014-06-16 DIAGNOSIS — R5381 Other malaise: Secondary | ICD-10-CM | POA: Insufficient documentation

## 2014-06-16 DIAGNOSIS — R293 Abnormal posture: Secondary | ICD-10-CM | POA: Insufficient documentation

## 2014-06-16 DIAGNOSIS — M6281 Muscle weakness (generalized): Secondary | ICD-10-CM | POA: Insufficient documentation

## 2014-06-16 NOTE — Progress Notes (Signed)
  Dawn Lucas - 58 y.o. female MRN [id number]  Date of birth: 1955-11-29  SUBJECTIVE:  Including CC & ROS.  Chief complaint: Left shoulder pain Dawn Lucas is a 58 year old African American female who presents today for followup left shoulder pain. The pain has been going on for approximately 8-9 months off after falling. She's also seen by her PCP for fibromyalgia and polymyalgia rheumatica. At our last office visit patient was treated with a subacromial injection for suspected rotator cuff tendinitis. Recommended continuing physical therapy which she had recently started a week of her office visit. Since that office visit patient reports an she has complete resolution of the pain in her left shoulder. She's regained full range of motion and strength with the help of physical therapy. However she continues to complain of generalized pain in her upper and lower back, knees elbows and ankles but is unchanged. She reports her PCP has recommended trying a course of prednisone for her suspected autoimmune conditions which she has refused to she does not want to gain weight. Patient reports that she has not been seen by rheumatology at this point in time.   ROS: Review of systems otherwise negative except for information present in HPI  HISTORY: Past Medical, Surgical, Social, and Family History Reviewed & Updated per EMR. Pertinent Historical Findings include: Obesity, tobacco abuse, history of chronic back bilateral knee and lower extremity pain  DATA REVIEWED: 06/17/13: Personally reviewed left shoulder 2 view. Patient is adequate alignment and joint space is preserved. No evidence of calcification or significant arthritic changes 07/04/11: Personally reviewed 4 view of the cervical spine. Vertebral body height and alignment within normal limits. Adequate height of intervertebral disc space. Mild arthritic changes in the facets.  PHYSICAL EXAM:  VS: BP:141/83 mmHg  HR: bpm  TEMP: ( )  RESP:   HT:5\' 4"   (162.6 cm)   WT:217 lb (98.431 kg)  BMI:37.3 PHYSICAL EXAM: SHOULDER EXAM:  General: well nourished Skin of UE: warm; dry, no rashes, lesions, ecchymosis or erythema. Vascular: radial pulses 2+ bilaterally Neurologically: Sensation to light touch upper extremities equal and intact bilaterally.  Normal sensation with no sensory or motor defects in C4-C8 Palpation: no tenderness over the [facility] joint, acromion, yes bicipital grove tenderness, yes supraspinatus tenderness ROM active/passive: Symmetric 0-180 in abduction and forward flexion, reduced left-sided internal and external rotation 0-50 abduction. Appley's scratch test symmetric Strength testing:   symmetric 5/5 in internal and external rotation, forward flexion, adduction and abduction compared to the right     Special Test:  Negative  Neer's, negative  Hawkins, negative  empty can, neg O'Brien, negative  speeds, neg apprehension  Negative bilateral Spurling's test  ASSESSMENT & PLAN: See problem based charting & AVS for pt instructions.  Patient has had resolution of her left shoulder rotator cuff tendinitis with the help of a subacromial injection physical therapy  Discuss with patient that her generalized pain that she describes in her other joints bilaterally and back is likely a combination of her fibromyalgia and polymyalgia rheumatica. Advised patient if she's not willing to try a prednisone to determine if this reduces her inflammation provided by her PCP and I recommend discussing with PCP a referral to dermatology for a complete autoimmune evaluation. Patient was agreeable as planned. Advised her to followup when necessary for her shoulder or other localized MSK issues

## 2014-06-20 ENCOUNTER — Encounter: Payer: No Typology Code available for payment source | Admitting: Physical Therapy

## 2014-06-22 ENCOUNTER — Encounter: Payer: No Typology Code available for payment source | Admitting: Physical Therapy

## 2014-08-19 ENCOUNTER — Encounter: Payer: Self-pay | Admitting: Family Medicine

## 2014-08-19 ENCOUNTER — Ambulatory Visit (INDEPENDENT_AMBULATORY_CARE_PROVIDER_SITE_OTHER): Payer: No Typology Code available for payment source | Admitting: Family Medicine

## 2014-08-19 VITALS — BP 150/95 | HR 95 | Temp 98.3°F | Ht 64.0 in | Wt 219.2 lb

## 2014-08-19 DIAGNOSIS — G479 Sleep disorder, unspecified: Secondary | ICD-10-CM

## 2014-08-19 DIAGNOSIS — L821 Other seborrheic keratosis: Secondary | ICD-10-CM

## 2014-08-19 DIAGNOSIS — R52 Pain, unspecified: Secondary | ICD-10-CM

## 2014-08-19 MED ORDER — TRIAMCINOLONE ACETONIDE 0.1 % EX CREA: 1.0000 "application " | TOPICAL_CREAM | Freq: Two times a day (BID) | CUTANEOUS | Status: DC

## 2014-08-19 NOTE — Patient Instructions (Addendum)
Thank you for coming to see me today. It was a pleasure. Today we talked about:   Skin "warts": This looks like something called seborrheic keratosis. Since you are having some itching, I will prescribe a steroid cream for you to use on your neck. I can see you in one month or so to take them off for you.  Pain: I will refer you to the rheumatologist to see if they can help. There is likely a long waiting list.  Increased blood pressure: I will check your blood pressure at your next visit.  Please make an appointment to see me in one month for follow-up.  If you have any questions or concerns, please do not hesitate to call the office at [phone].  Sincerely,  Cordelia Poche, MD   Polymyalgia Rheumatica Polymyalgia rheumatica (also called PMR or polymyalgia) is a rheumatologic (arthritic) condition that causes pain and morning stiffness in your neck, shoulders, and hips. It is an inflammatory condition. In some people, inflammation of certain structures in the shoulder, hips, or other joints can be seen on special testing. It does not cause joint destruction, as occurs in other arthritic conditions. It usually occurs after 59 years of age, and is more common as you age. It can be confused with several other diseases, but it is usually easily treated. People with PMR often have, or can develop, a more severe rheumatologic condition called giant cell arteritis (also called CGA or temporal arteritis).  CAUSES  The exact cause of PMR is not known.   There are genetic factors involved.  Viruses have been suspected in the cause of PMR. This has not been proven. SYMPTOMS   Aching, pain, and morning stiffness your neck, both shoulders, or both hips.  Symptoms usually start slowly and build gradually.  Morning stiffness usually lasts at least 30 minutes.  Swelling and tenderness in other joints of the arms, hands, legs, and feet may occur.  Swelling and inflammation in the wrists can  cause nerve inflammation at the wrist (carpal tunnel syndrome).  You may also have low grade fever, fatigue, weakness, decreased appetite and weight loss. DIAGNOSIS   Your caregiver may suspect that you have PMR based on your description of your symptoms and on your exam.  Your caregiver will examine you to be sure you do not have diseases that can be confused with PMR. These diseases include rheumatoid arthritis, fibromyalgia, or thyroid disease.  Your caregiver should check for signs of giant cell arteritis. This can cause serious complications such as blindness.  Lab tests can help confirm that you have PMR and not other diseases, but are sometimes inconclusive.  X-rays cannot show PMR. However, it can identify other diseases like rheumatoid arthritis. Your caregiver may have you see a specialist in arthritis and inflammatory diseases (rheumatologist). TREATMENT  The goal of treatment is relief of symptoms. Treatment does not shorten the course of the illness or prevent complications. With proper treatment, you usually feel better almost right away.   The initial treatment of PMR is usually a cortisone (steroid) medication. Your caregiver will help determine a starting dose. The dose is gradually reduced every few weeks to months. Treatment usually lasts one to three years.  Other stronger medications are rarely needed. They will only be prescribed if your symptoms do not get better on cortisone medication alone, or if they recur as the dose is reduced.  Cortisone medication can have different side effects. With the doses of cortisone needed for PMR,  the side effects can affect bones and joints, blood sugar control in diabetes, and mood changes. Discuss this with your caregiver.  Your caregiver will evaluate you regularly during your treatment. They will do this in order to assess progress and to check for complications of the illness or treatment.  Physical therapy is sometimes useful.  This is especially true if your joints are still stiff after other symptoms have improved. HOME CARE INSTRUCTIONS   Follow your caregiver's instructions. Do not change your dose of cortisone medication on your own.  Keep your appointments for follow-up lab tests and caregiver visits. Your lab tests need to be monitored. You must get checked periodically for giant cell arteritis.  Follow your caregiver's guidance regarding physical activity (usually no restrictions are needed) or physical therapy.  Your caregiver may have instructions to prevent or check for side effects from cortisone medication (including bone density testing or treatment). Follow their instructions carefully. SEEK MEDICAL CARE IF:   You develop any side effects from treatment. Side effects can include:  Elevated blood pressure.  High blood sugar (or worsening of diabetes, if you are diabetic).  Difficulty fighting off infections.  Weight gain.  Weakness of the bones (osteoporosis).  Your aches, pains, morning stiffness, or other symptoms get worse with time. This is especially true after your dose of cortisone is reduced.  You develop new joint symptoms (pain, swelling, etc.) SEEK IMMEDIATE MEDICAL CARE IF:   You develop a severe headache.  You start vomiting.  You have problems with your vision.  You have an oral temperature above 102 F (38.9 C), not controlled by medicine. Document Released: 10/31/2004 Document Revised: 09/09/2012 Document Reviewed: 02/13/2009 [facility] Patient Information 2015 [place], [place]. This information is not intended to replace advice given to you by your health care provider. Make sure you discuss any questions you have with your health care provider.    Seborrheic Keratosis Seborrheic keratosis is a common, noncancerous (benign) skin growth that can occur anywhere on the skin.It looks like "stuck-on," waxy, rough, tan, brown, or black spots on the skin. These skin growths  can be flat or raised.They are often called "barnacles" because of their pasted-on appearance.Usually, these skin growths appear in adulthood, around age 32, and increase in number as you age. They may also develop during pregnancy or following estrogen therapy. Many people may only have one growth appear in their lifetime, while some people may develop many growths. CAUSES It is unknown what causes these skin growths, but they appear to run in families. SYMPTOMS Seborrheic keratosis is often located on the face, chest, shoulders, back, or other areas. These growths are:  Usually painless, but may become irritated and itchy.  Yellow, brown, black, or other colors.  Slightly raised or have a flat surface.  Sometimes rough or wart-like in texture.  Often waxy on the surface.  Round or oval-shaped.  Sometimes "stuck-on" in appearance.  Sometimes single, but there are usually many growths. Any growth that bleeds, itches on a regular basis, becomes inflamed, or becomes irritated needs to be evaluated by a skin specialist (dermatologist). DIAGNOSIS Diagnosis is mainly based on the way the growths appear. In some cases, it can be difficult to tell this type of skin growth from skin cancer. A skin growth tissue sample (biopsy) may be used to confirm the diagnosis. TREATMENT Most often, treatment is not needed because the skin growths are benign.If the skin growth is irritated easily by clothing or jewelry, causing it to scab or  bleed, treatment may be recommended. Patients may also choose to have the growths removed because they do not like their appearance. Most commonly, these growths are treated with cryosurgery. In cryosurgery, liquid nitrogen is applied to "freeze" the growth. The growth usually falls off within a matter of days. A blister may form and dry into a scab that will also fall off. After the growth or scab falls off, it may leave a dark or light spot on the skin. This color may  fade over time, or it may remain permanent on the skin. HOME CARE INSTRUCTIONS If the skin growths are treated with cryosurgery, the treated area needs to be kept clean with water and soap. SEEK MEDICAL CARE IF:  You have questions about these growths or other skin problems.  You develop new symptoms, including:  A change in the appearance of the skin growth.  New growths.  Any bleeding, itching, or pain in the growths.  A skin growth that looks similar to seborrheic keratosis. Document Released: 10/26/2010 Document Revised: 12/16/2011 Document Reviewed: 10/26/2010 [facility] Patient Information 2015 [place], [place]. This information is not intended to replace advice given to you by your health care provider. Make sure you discuss any questions you have with your health care provider.

## 2014-08-19 NOTE — Progress Notes (Signed)
    Subjective    Dawn Lucas is a 58 y.o. female that presents for an office visit.   1. Generalized pain: Deals with the pain. Says she has no money to buy medications. She takes hot baths that help very little. Sitting, standing and being active cause pain.  2. Insomnia: Sleeps about 3 hours during the night. Falls asleep fine but wakes up in the middle of the night because her pain causes her to toss and turn.  3. Moles: On the back of her neck. Itchy. Have been present for about one month. She does not stay in the sun often. No bleeding or redness. They are similar to the ones she has all over her body.  History  Substance Use Topics  . Smoking status: Current Every Day Smoker -- 0.30 packs/day for 45 years    Types: Cigarettes  . Smokeless tobacco: Not on file  . Alcohol Use: No    Allergies  Allergen Reactions  . Quinolones Itching and Rash  . Sulfa Antibiotics Rash    No orders of the defined types were placed in this encounter.    ROS  Per HPI   Objective   BP 150/95 mmHg  Pulse 95  Temp(Src) 98.3 F (36.8 C) (Oral)  Ht 5\' 4"  (1.626 m)  Wt 219 lb 3.2 oz (99.428 kg)  BMI 37.61 kg/m2  General: Fair appearing female in no distress Skin: multiple hyperpigmented, well demarcated raised flat lesions located along back of neck with no erythema or discoloration.   Assessment and Plan   Please refer to problem based charting of assessment and plan

## 2014-08-22 DIAGNOSIS — L821 Other seborrheic keratosis: Secondary | ICD-10-CM | POA: Insufficient documentation

## 2014-08-22 NOTE — Assessment & Plan Note (Addendum)
Rash does not appear inflamed. Triamcinolone cream for itching on neck. Discussed removal for next visit.

## 2014-08-22 NOTE — Assessment & Plan Note (Signed)
Patient continues to be non-adherent to medication. Will follow

## 2014-08-22 NOTE — Assessment & Plan Note (Signed)
Patient continues to refuse treatment of steroids. Will place rheumatology consult.

## 2014-08-23 ENCOUNTER — Encounter: Payer: Self-pay | Admitting: *Deleted

## 2014-09-08 ENCOUNTER — Other Ambulatory Visit: Payer: Self-pay

## 2014-09-08 DIAGNOSIS — Z1231 Encounter for screening mammogram for malignant neoplasm of breast: Secondary | ICD-10-CM

## 2014-09-16 ENCOUNTER — Ambulatory Visit (INDEPENDENT_AMBULATORY_CARE_PROVIDER_SITE_OTHER): Payer: No Typology Code available for payment source | Admitting: Family Medicine

## 2014-09-16 ENCOUNTER — Encounter: Payer: Self-pay | Admitting: Family Medicine

## 2014-09-16 VITALS — BP 119/87 | HR 93 | Temp 98.4°F | Ht 64.0 in | Wt 216.3 lb

## 2014-09-16 DIAGNOSIS — R52 Pain, unspecified: Secondary | ICD-10-CM

## 2014-09-16 DIAGNOSIS — I10 Essential (primary) hypertension: Secondary | ICD-10-CM

## 2014-09-16 DIAGNOSIS — G479 Sleep disorder, unspecified: Secondary | ICD-10-CM

## 2014-09-16 MED ORDER — HYDROCHLOROTHIAZIDE 25 MG PO TABS: ORAL_TABLET | ORAL | Status: DC

## 2014-09-16 MED ORDER — NORTRIPTYLINE HCL 25 MG PO CAPS: 25.0000 mg | ORAL_CAPSULE | Freq: Every day | ORAL | Status: DC

## 2014-09-16 NOTE — Progress Notes (Signed)
    Subjective    Dawn Lucas is a 58 y.o. female that presents for an office visit.   1. Generalized pain: This is a chronic issue. Patient states she received a call for a rheumatologist appointment but the doctor is located in [place]. She states that she does not know anyone with transportation. States her nephew drives but that he is unavailable to take her. She does not have the funds. Pain remains constant. Continues to decline prednisone treatment. She has not been taking nortriptyline.   2. Right leg pain: pain is generalized but worse on her right. Pain has remained unchanged, however and is chronic. Patient has had workup in the past for her pain. No worsening symptoms.   3. Insomnia: patient states it is hard for her to stay asleep because pain. She does not have  4. Hypertension: No headaches or shortness of breath. Patient continues to have atypical chest pain which is chronic  History  Substance Use Topics  . Smoking status: Current Every Day Smoker -- 0.30 packs/day for 45 years    Types: Cigarettes  . Smokeless tobacco: Not on file  . Alcohol Use: No    Allergies  Allergen Reactions  . Quinolones Itching and Rash  . Sulfa Antibiotics Rash    No orders of the defined types were placed in this encounter.    ROS  Per HPI   Objective   BP 119/87 mmHg  Pulse 93  Temp(Src) 98.4 F (36.9 C) (Oral)  Ht 5\' 4"  (1.626 m)  Wt 216 lb 4.8 oz (98.113 kg)  BMI 37.11 kg/m2  General: Fair appearing female in no distress Musculoskeletal: edema in bilateral LE, pulses felt bilaterally  Assessment and Plan   Please refer to problem based charting of assessment and plan

## 2014-09-16 NOTE — Patient Instructions (Addendum)
Thank you for coming to see me today. It was a pleasure. Today we talked about:   Generalized pain: I will refill your nortriptyline. I hope this helps with your pain in order to help with your sleep. Hopefully we can get you to see the rheumatologist soon.  Please make an appointment to see me in 3 months for follow-up.  If you have any questions or concerns, please do not hesitate to call the office at [phone].  Sincerely,  Cordelia Poche, MD

## 2014-09-19 NOTE — Assessment & Plan Note (Signed)
Insomnia seems related to patient's chronic pain and home living situation. Will refill nortriptyline since patient has not been taking.

## 2014-09-19 NOTE — Assessment & Plan Note (Signed)
Controlled today. Continue HCTZ. Will refill.

## 2014-09-19 NOTE — Assessment & Plan Note (Signed)
Patient continues to decline prednisone treatment due to weight gain concerns. Will refill nortriptyline. Patient currently working on disability case to try and qualify for Medicaid so she can get transportation.

## 2014-09-27 ENCOUNTER — Other Ambulatory Visit (INDEPENDENT_AMBULATORY_CARE_PROVIDER_SITE_OTHER): Payer: No Typology Code available for payment source | Admitting: Family Medicine

## 2014-09-27 DIAGNOSIS — Z1211 Encounter for screening for malignant neoplasm of colon: Secondary | ICD-10-CM

## 2014-09-27 LAB — POC HEMOCCULT BLD/STL (HOME/3-CARD/SCREEN)
Card #2 Fecal Occult Blod, POC: NEGATIVE
Card #3 Fecal Occult Blood, POC: POSITIVE
Fecal Occult Blood, POC: NEGATIVE

## 2014-09-27 NOTE — Progress Notes (Signed)
Stool cards for occult blood were returned.  1 positive out of 3 cards

## 2014-10-15 ENCOUNTER — Other Ambulatory Visit: Payer: Self-pay | Admitting: Family Medicine

## 2014-10-15 DIAGNOSIS — R195 Other fecal abnormalities: Secondary | ICD-10-CM

## 2014-10-20 ENCOUNTER — Ambulatory Visit
Admission: RE | Admit: 2014-10-20 | Discharge: 2014-10-20 | Disposition: A | Discharge status: Home / Self Care | Payer: No Typology Code available for payment source | Source: Ambulatory Visit

## 2014-10-20 DIAGNOSIS — Z1231 Encounter for screening mammogram for malignant neoplasm of breast: Secondary | ICD-10-CM

## 2015-03-22 ENCOUNTER — Telehealth: Payer: Self-pay | Admitting: Family Medicine

## 2015-03-22 NOTE — Telephone Encounter (Signed)
Dawn Lucas is calling because she would like for her PCP to be aware that [facility] will be sending him a fax and that he does need to respond at the earliest convenience. Thank you, [name], ASA

## 2015-03-28 ENCOUNTER — Encounter: Payer: Self-pay | Admitting: Family Medicine

## 2015-03-28 ENCOUNTER — Ambulatory Visit (INDEPENDENT_AMBULATORY_CARE_PROVIDER_SITE_OTHER): Payer: Medicaid Other | Admitting: Family Medicine

## 2015-03-28 VITALS — BP 137/96 | HR 96 | Temp 98.1°F | Ht 64.0 in | Wt 218.0 lb

## 2015-03-28 DIAGNOSIS — M2142 Flat foot [pes planus] (acquired), left foot: Secondary | ICD-10-CM

## 2015-03-28 DIAGNOSIS — R52 Pain, unspecified: Secondary | ICD-10-CM

## 2015-03-28 DIAGNOSIS — M2141 Flat foot [pes planus] (acquired), right foot: Secondary | ICD-10-CM | POA: Diagnosis not present

## 2015-03-28 DIAGNOSIS — I10 Essential (primary) hypertension: Secondary | ICD-10-CM

## 2015-03-28 DIAGNOSIS — Z1211 Encounter for screening for malignant neoplasm of colon: Secondary | ICD-10-CM

## 2015-03-28 MED ORDER — HYDROCHLOROTHIAZIDE 25 MG PO TABS: ORAL_TABLET | ORAL | Status: DC

## 2015-03-28 NOTE — Patient Instructions (Signed)
Thank you for coming to see me today. It was a pleasure. Today we talked about:   Blood pressure: your blood pressure was slightly elevated today. I will not make any changes as this is generally not the case. I am refilling your hydrochlorothiazide.  Generalized pain: I am referring you to rheumatology  Colon cancer screening: I am referring you to gastroenterology  Foot pain: I am referring you to sports medicine  Please make an appointment to see me in 4 weeks for follow-up.  If you have any questions or concerns, please do not hesitate to call the office at [phone].  Sincerely,  Cordelia Poche, MD

## 2015-03-28 NOTE — Assessment & Plan Note (Signed)
Most likely contributing to patient's pain and numbness. Will refer to sports medicine for evaluation and probable orthotics.

## 2015-03-28 NOTE — Assessment & Plan Note (Signed)
Again, most consistent with polymyalgia rheumatica however patient continues to refuse treatment due to concern for weight gain. Patient requesting specialist opinion so will refer to rheumatologist for follow-up

## 2015-03-28 NOTE — Assessment & Plan Note (Signed)
Diastolic uncontrolled today. No changes today. Refill hctz.

## 2015-03-28 NOTE — Progress Notes (Signed)
    Subjective    Dawn Lucas is a 59 y.o. female that presents for a follow-up visit for chronic issues.   1. Generalized pain: Chronic issue. No improvement, however, patient continues to decline treatments. Would like a referral to a specialist. 2. Right toe numbness: Chronic issue. Constant with associated foot cramping. Symptoms confounded by generalized pain. Nothing worsens or improves symptoms. She wears tennis shows. 3. Hypertension: patient adherent with hydrochlorothiazide but needs refills. She has some headaches that are intermittent. No chest pain with no dyspnea    History  Substance Use Topics  . Smoking status: Current Every Day Smoker -- 0.30 packs/day for 45 years    Types: Cigarettes  . Smokeless tobacco: Not on file  . Alcohol Use: No    Allergies  Allergen Reactions  . Quinolones Itching and Rash  . Sulfa Antibiotics Rash    Meds ordered this encounter  Medications  . hydrochlorothiazide (HYDRODIURIL) 25 MG tablet    Sig: TAKE ONE TABLET BY MOUTH ONCE DAILY    Dispense:  30 tablet    Refill:  2    ROS  Per HPI   Objective   BP 137/96 mmHg  Pulse 96  Temp(Src) 98.1 F (36.7 C) (Oral)  Ht 5\' 4"  (1.626 m)  Wt 218 lb (98.884 kg)  BMI 37.40 kg/m2  General: Fair appearing, no distress Extremities: Right foot with intact sensation of light touch and pinprick of dorsum and plantar surfaces. 5/5 great toe strength. Flattened longitudinal arches bilaterally but worse on left Neuro: Alert, oriented  Assessment and Plan   Please refer to problem based charting of assessment and plan

## 2015-04-07 ENCOUNTER — Ambulatory Visit (INDEPENDENT_AMBULATORY_CARE_PROVIDER_SITE_OTHER): Payer: Medicaid Other | Admitting: Family Medicine

## 2015-04-07 ENCOUNTER — Encounter: Payer: Self-pay | Admitting: Family Medicine

## 2015-04-07 VITALS — BP 144/96 | HR 91 | Ht 64.0 in | Wt 218.0 lb

## 2015-04-07 DIAGNOSIS — M2141 Flat foot [pes planus] (acquired), right foot: Secondary | ICD-10-CM

## 2015-04-07 DIAGNOSIS — R29898 Other symptoms and signs involving the musculoskeletal system: Secondary | ICD-10-CM | POA: Diagnosis not present

## 2015-04-07 DIAGNOSIS — R269 Unspecified abnormalities of gait and mobility: Secondary | ICD-10-CM

## 2015-04-07 DIAGNOSIS — M2142 Flat foot [pes planus] (acquired), left foot: Secondary | ICD-10-CM | POA: Diagnosis not present

## 2015-04-07 DIAGNOSIS — M25571 Pain in right ankle and joints of right foot: Secondary | ICD-10-CM | POA: Diagnosis not present

## 2015-04-07 NOTE — Patient Instructions (Signed)
iF THE TEMPORARY ORTHOTICS WORK AND YOU WANT A CUSTOM MOLDED PAIR, MAKE ANOTHER APPOINTMENT TO SEE [place] AND TELL TEHERECEPTIONIST IT IS FOR ORTHOTICS.

## 2015-04-07 NOTE — Progress Notes (Signed)
  Jaliah Foody - 59 y.o. female MRN [id number]  Date of birth: 12-Sep-1956  SUBJECTIVE:  Including CC & ROS.  No chief complaint on file.  Tonae Self is a 59 yo female who presents today with chronic bilateral foot pain. She describes shooting pains that extend from her big toe through the medial part of her foot up into her medial ankle. She also has foot cramping and constant right toe numbness. She denies any prior trauma or injury to either ankle. She has swelling in both ankles and says that she often limps due to pain. Of note, she does have generalized pain. Nothing worsens or improves symptoms. She primarily wears tennis shoes.  She is also being seen by her PCP for fibromyalgia and polymyalgia rheumatica and she does not like to be touched due to generalized pain. She has an appointment with rheumatology for August.   HISTORY:  Pertinent Historical Findings include: Bilateral knee pain, pes planus of both feet  DATA REVIEWED: 06/17/2013: Right Ankle X ray: Frontal, oblique, and lateral views 2014 Mild soft tissue swelling. Inferior calcaneal spur. No fracture.  PHYSICAL EXAM:  VS: BP:(!) 144/96 mmHg  HR:91bpm  TEMP: ( )  RESP:   HT:5\' 4"  (162.6 cm)   WT:218 lb (98.884 kg)  BMI:37.5 PHYSICAL EXAM: General: NAD, overweight woman, very hesitant to be touched due to generalized pain MSK: Tenderness to palpation over MCP joint, medial malleolus, and lateral malleolus in both feet. Right foot has reduced ROM compared to left. Flattened transverse arches and longitudinal bilaterally. Gait with valgus stress on ankle. Neuro: Intact sensation to light touch on plantar and dorsum surface of foot and big toe.  Skin: No rashes or lesions noted  ASSESSMENT & PLAN: See problem based charting & AVS for pt instructions. Sully Widmann is a 59 yo with bilateral pes planus and valgus stress on her ankle with walking, which is causing her significant pain. We gave her orthotics with scaphoid  pads as a trial to see if this arch support improves her pain.

## 2015-04-11 NOTE — Progress Notes (Signed)
Patient ID: Dawn Lucas, female   DOB: Sep 08, 1956, 59 y.o.   MRN: [id number] [place] Attending Note: I have seen and examined this patient with the medical student. I have  reviewed the history, physical examination, assessment and plan as documented in the medical student's note.  I agree with the medical student's note and findings, assessment and treatment plan as documented with the following additions or changes:  Bilateral foot and ankle pain which is part of his overall chronic pain and fibromyalgia syndrome. Initially she did limited exam by not wanting me to touch her feet but ultimately after a bit, she became more trusting we were able to complete the foot and ankle exam. 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 This results in a equinus valgus position of the heel and places a lot of stretch stress on her medial ankle tendons. She has a rather short stride length on swing phase and walks with an otherwise antalgic gait, never quite straightening her knees into full extension.  ASSESSMENT: Bilateral foot and ankle pain, multifactorial. I think the best one can do for her today as add some temporary orthotic Center her shoe and see if she tolerates them. I placed medium gray scaphoid pads bilaterally on the green insoles.  We discussed custom molded orthotics which would take More rim in her shoe. I don't think would be able to get these into her current set of shoes so if she decides to come back for those to be molded, she will likely have to buy new pair shoes as half size bigger.  I thank you be beneficial for her to have some type of overall conditioning program and briefly discussed home exercise program but she does not really seem receptive to that idea.  I will have her come back to see S when necessary. We will need a set of larger shoes to make custom molded orthotics. There's not much additional we can do at  this time.

## 2015-04-25 ENCOUNTER — Ambulatory Visit (INDEPENDENT_AMBULATORY_CARE_PROVIDER_SITE_OTHER): Payer: Medicaid Other | Admitting: Family Medicine

## 2015-04-25 ENCOUNTER — Encounter: Payer: Self-pay | Admitting: Family Medicine

## 2015-04-25 VITALS — BP 139/89 | HR 69 | Temp 98.3°F | Ht 64.0 in | Wt 218.3 lb

## 2015-04-25 DIAGNOSIS — Z1211 Encounter for screening for malignant neoplasm of colon: Secondary | ICD-10-CM

## 2015-04-25 DIAGNOSIS — Z720 Tobacco use: Secondary | ICD-10-CM | POA: Diagnosis not present

## 2015-04-25 DIAGNOSIS — R7303 Prediabetes: Secondary | ICD-10-CM

## 2015-04-25 DIAGNOSIS — R52 Pain, unspecified: Secondary | ICD-10-CM

## 2015-04-25 DIAGNOSIS — Z23 Encounter for immunization: Secondary | ICD-10-CM | POA: Diagnosis not present

## 2015-04-25 DIAGNOSIS — I10 Essential (primary) hypertension: Secondary | ICD-10-CM | POA: Diagnosis not present

## 2015-04-25 DIAGNOSIS — R7309 Other abnormal glucose: Secondary | ICD-10-CM | POA: Diagnosis not present

## 2015-04-25 LAB — POCT GLYCOSYLATED HEMOGLOBIN (HGB A1C): Hemoglobin A1C: 6.2

## 2015-04-25 NOTE — Patient Instructions (Signed)
Thank you for coming to see me today. It was a pleasure. Today we talked about:   Hypertension: continue hydrochlorothiazide  Colon cancer screening: I am referring you to the gastroenterologist  I am getting some labs today. You will get a letter in the mail  Please make an appointment to see me in 3 months for follow-up.  If you have any questions or concerns, please do not hesitate to call the office at [phone].  Sincerely,  Cordelia Poche, MD

## 2015-04-25 NOTE — Progress Notes (Signed)
    Subjective    Dawn Lucas is a 59 y.o. female that presents for a follow-up visit for chronic issues.   1. Hypertension: Adherent with regimen. She has no anginal symptoms. No shortness of breath.  2. Colon cancer screening: Mother passed from colon cancer. Patient has had a positive FOBT in the passed but declined colon cancer testing  3. Tobacco abuse: Patient smokes a few cigarettes per day. She does not feel like quitting.  4. Chest discomfort: Started two weeks ago. Described as tightness and has been persistent and present in shoulder and back. Worse with position changes. No associated shortness of breath or diaphoresis.Patient states it is like her muscles are pulling.  History  Substance Use Topics  . Smoking status: Current Every Day Smoker -- 0.30 packs/day for 45 years    Types: Cigarettes  . Smokeless tobacco: Not on file  . Alcohol Use: No    Allergies  Allergen Reactions  . Quinolones Itching and Rash  . Sulfa Antibiotics Rash    No orders of the defined types were placed in this encounter.    ROS  Per HPI   Objective   BP 139/89 mmHg  Pulse 69  Temp(Src) 98.3 F (36.8 C) (Oral)  Ht 5\' 4"  (1.626 m)  Wt 218 lb 4.8 oz (99.02 kg)  BMI 37.45 kg/m2  General: Well appearing, no distress  Assessment and Plan   Please refer to problem based charting of assessment and plan

## 2015-04-28 DIAGNOSIS — R7303 Prediabetes: Secondary | ICD-10-CM | POA: Insufficient documentation

## 2015-04-28 NOTE — Assessment & Plan Note (Signed)
Patient with chest discomfort in addition to her regular pain. Pain appears non-cardiac and related to overall generalized muscular pain. Red flags discussed. Patient to follow-up with Rheumatology in October

## 2015-04-28 NOTE — Assessment & Plan Note (Signed)
Patient not ready to quit. Will continue to monitor.

## 2015-04-28 NOTE — Assessment & Plan Note (Signed)
Controlled today. No changes.

## 2015-05-02 ENCOUNTER — Other Ambulatory Visit: Payer: Medicaid Other

## 2015-05-02 DIAGNOSIS — I10 Essential (primary) hypertension: Secondary | ICD-10-CM

## 2015-05-02 LAB — COMPREHENSIVE METABOLIC PANEL
ALT: 16 U/L (ref 6–29)
AST: 15 U/L (ref 10–35)
Albumin: 4.2 g/dL (ref 3.6–5.1)
Alkaline Phosphatase: 64 U/L (ref 33–130)
BUN: 12 mg/dL (ref 7–25)
CO2: 23 mEq/L (ref 20–31)
Calcium: 9.2 mg/dL (ref 8.6–10.4)
Chloride: 109 mEq/L (ref 98–110)
Creat: 0.97 mg/dL (ref 0.50–0.99)
Glucose, Bld: 126 mg/dL — ABNORMAL HIGH (ref 65–99)
Potassium: 3.8 mEq/L (ref 3.5–5.3)
Sodium: 140 mEq/L (ref 135–146)
Total Bilirubin: 0.6 mg/dL (ref 0.2–1.2)
Total Protein: 6.8 g/dL (ref 6.1–8.1)

## 2015-05-02 LAB — LDL CHOLESTEROL, DIRECT: Direct LDL: 160 mg/dL — ABNORMAL HIGH (ref <130)

## 2015-05-02 NOTE — Progress Notes (Signed)
CBC WITH DIFF,CMP, D-LDL AND TSH DONE TODAY Dawn Lucas

## 2015-05-03 LAB — CBC WITH DIFFERENTIAL/PLATELET
Basophils Absolute: 0.1 10*3/uL (ref 0.0–0.1)
Basophils Relative: 1 % (ref 0–1)
Eosinophils Absolute: 0.2 10*3/uL (ref 0.0–0.7)
Eosinophils Relative: 2 % (ref 0–5)
HCT: 38.4 % (ref 36.0–46.0)
Hemoglobin: 12.8 g/dL (ref 12.0–15.0)
Lymphocytes Relative: 43 % (ref 12–46)
Lymphs Abs: 4.1 10*3/uL — ABNORMAL HIGH (ref 0.7–4.0)
MCH: 28.3 pg (ref 26.0–34.0)
MCHC: 33.3 g/dL (ref 30.0–36.0)
MCV: 85 fL (ref 78.0–100.0)
MPV: 9.7 fL (ref 8.6–12.4)
Monocytes Absolute: 0.6 10*3/uL (ref 0.1–1.0)
Monocytes Relative: 6 % (ref 3–12)
Neutro Abs: 4.6 10*3/uL (ref 1.7–7.7)
Neutrophils Relative %: 48 % (ref 43–77)
Platelets: 357 10*3/uL (ref 150–400)
RBC: 4.52 MIL/uL (ref 3.87–5.11)
RDW: 14.7 % (ref 11.5–15.5)
WBC: 9.6 10*3/uL (ref 4.0–10.5)

## 2015-05-03 LAB — TSH: TSH: 2.903 u[IU]/mL (ref 0.350–4.500)

## 2015-06-30 ENCOUNTER — Other Ambulatory Visit: Payer: Self-pay | Admitting: Gastroenterology

## 2015-07-24 ENCOUNTER — Other Ambulatory Visit: Payer: Self-pay | Admitting: *Deleted

## 2015-07-24 NOTE — Telephone Encounter (Signed)
Opened in error.  Fax form placed in providers box. Ryann Pauli,CMA

## 2015-08-10 ENCOUNTER — Other Ambulatory Visit: Payer: Self-pay | Admitting: Family Medicine

## 2015-08-10 NOTE — Telephone Encounter (Signed)
Pt called and needs a refill on her Hydrochlorothiazide. jw °

## 2015-08-11 MED ORDER — HYDROCHLOROTHIAZIDE 25 MG PO TABS: ORAL_TABLET | ORAL | Status: DC

## 2015-08-11 NOTE — Telephone Encounter (Signed)
HCTZ called in. 90 day supply with 3 refills

## 2015-08-11 NOTE — Telephone Encounter (Signed)
Same as below. Dawn Lucas, Dawn Lucas

## 2015-08-11 NOTE — Telephone Encounter (Signed)
Tried calling pt. Received a fast busy signal. Will try again this afternoon. Ottis Stain, CMA

## 2015-08-23 ENCOUNTER — Ambulatory Visit (INDEPENDENT_AMBULATORY_CARE_PROVIDER_SITE_OTHER): Payer: Medicaid Other | Admitting: Family Medicine

## 2015-08-23 VITALS — BP 128/72 | HR 63 | Temp 98.3°F | Wt 210.0 lb

## 2015-08-23 DIAGNOSIS — I10 Essential (primary) hypertension: Secondary | ICD-10-CM | POA: Diagnosis not present

## 2015-08-23 DIAGNOSIS — M2142 Flat foot [pes planus] (acquired), left foot: Secondary | ICD-10-CM

## 2015-08-23 DIAGNOSIS — M2141 Flat foot [pes planus] (acquired), right foot: Secondary | ICD-10-CM | POA: Diagnosis not present

## 2015-08-23 DIAGNOSIS — R7303 Prediabetes: Secondary | ICD-10-CM

## 2015-08-23 LAB — POCT GLYCOSYLATED HEMOGLOBIN (HGB A1C): Hemoglobin A1C: 6.1

## 2015-08-23 NOTE — Patient Instructions (Signed)
Thank you for coming to see me today. It was a pleasure. Today we talked about:   Blood pressure: controlled. No changes  Feet issues: I will place a referral  Prediabetes: I am checking your A1C today.  Please make an appointment to see me in 3 months for follow-up.  If you have any questions or concerns, please do not hesitate to call the office at [phone].  Sincerely,  Cordelia Poche, MD

## 2015-08-23 NOTE — Progress Notes (Signed)
    Subjective    Dawn Lucas is a 59 y.o. female that presents for a follow-up visit for chronic issues.   1. Chronic pain: She was recently seen by rheumatology. She was started on vitamin D and another medication, however, she is unsure of the name. She will be following up with rheumatology in the next month. She was recently seen by gastroenterology for a colonoscopy.  2. Hypertension: adherent with HCTZ. No chest pain or dyspnea  3. Prediabetes: Patient currently diet controlled. No polydipsia, polyuria.  Social History  Substance Use Topics  . Smoking status: Current Every Day Smoker -- 0.30 packs/day for 45 years    Types: Cigarettes  . Smokeless tobacco: Not on file  . Alcohol Use: No    Allergies  Allergen Reactions  . Quinolones Itching and Rash  . Sulfa Antibiotics Rash    No orders of the defined types were placed in this encounter.    ROS  Per HPI   Objective   BP 128/72 mmHg  Pulse 63  Temp(Src) 98.3 F (36.8 C) (Oral)  Wt 210 lb (95.255 kg)  General: Well appearing Extremities: Feet without ulcers. Diminished sensation with monofilament testing. Feet with flat arches and pes planus   Assessment and Plan    Prediabetes Obtain A1c. Continue diet modification  Pes planus of both feet Patient states she does not want to go back to sports medicine as last time the orthotics made her symptoms worse. Will send to podiatry at patient's request.  Hypertension Controlled, no changes.

## 2015-08-29 NOTE — Assessment & Plan Note (Signed)
Controlled, no changes. 

## 2015-08-29 NOTE — Assessment & Plan Note (Signed)
Patient states she does not want to go back to sports medicine as last time the orthotics made her symptoms worse. Will send to podiatry at patient's request.

## 2015-08-29 NOTE — Assessment & Plan Note (Signed)
Obtain A1c. Continue diet modification

## 2015-09-20 ENCOUNTER — Other Ambulatory Visit: Payer: Self-pay

## 2015-09-20 DIAGNOSIS — Z1231 Encounter for screening mammogram for malignant neoplasm of breast: Secondary | ICD-10-CM

## 2015-09-26 ENCOUNTER — Telehealth: Payer: Self-pay | Admitting: *Deleted

## 2015-09-26 NOTE — Telephone Encounter (Signed)
Received faxed refill request from [facility] [phone] Fax [phone]) for a 90 day supply of celecoxib capsules 100 mg, however, this med is not on patient's current or historical med list. Please advise. Velora Heckler, RN

## 2015-09-28 NOTE — Telephone Encounter (Signed)
Patient not on this medication unless she is receiving it from another physician. Will not approve medication refill

## 2015-10-23 ENCOUNTER — Ambulatory Visit
Admission: RE | Admit: 2015-10-23 | Discharge: 2015-10-23 | Disposition: A | Discharge status: Home / Self Care | Payer: Medicaid Other | Source: Ambulatory Visit

## 2015-10-23 DIAGNOSIS — Z1231 Encounter for screening mammogram for malignant neoplasm of breast: Secondary | ICD-10-CM

## 2015-10-25 ENCOUNTER — Ambulatory Visit (INDEPENDENT_AMBULATORY_CARE_PROVIDER_SITE_OTHER): Payer: Medicaid Other | Admitting: Podiatry

## 2015-10-25 ENCOUNTER — Encounter: Payer: Self-pay | Admitting: Podiatry

## 2015-10-25 ENCOUNTER — Ambulatory Visit (INDEPENDENT_AMBULATORY_CARE_PROVIDER_SITE_OTHER): Payer: Medicaid Other

## 2015-10-25 VITALS — BP 147/79 | HR 60 | Resp 12

## 2015-10-25 DIAGNOSIS — M2011 Hallux valgus (acquired), right foot: Secondary | ICD-10-CM | POA: Diagnosis not present

## 2015-10-25 DIAGNOSIS — M2010 Hallux valgus (acquired), unspecified foot: Secondary | ICD-10-CM

## 2015-10-25 DIAGNOSIS — M19072 Primary osteoarthritis, left ankle and foot: Secondary | ICD-10-CM

## 2015-10-25 DIAGNOSIS — R52 Pain, unspecified: Secondary | ICD-10-CM

## 2015-10-25 DIAGNOSIS — M2012 Hallux valgus (acquired), left foot: Secondary | ICD-10-CM

## 2015-10-25 DIAGNOSIS — M19071 Primary osteoarthritis, right ankle and foot: Secondary | ICD-10-CM

## 2015-10-25 NOTE — Patient Instructions (Signed)
Today your x-ray examination demonstrated arthritic changes in your midfoot on right and left feet which are most likely the primary pain sores. I would recommend a rigid orthotic or a shoe with a steel shank Maintain your Celebrex 4 your generalized arthritic pain   Osteoarthritis Osteoarthritis is a disease that causes soreness and inflammation of a joint. It occurs when the cartilage at the affected joint wears down. Cartilage acts as a cushion, covering the ends of bones where they meet to form a joint. Osteoarthritis is the most common form of arthritis. It often occurs in older people. The joints affected most often by this condition include those in the:  Ends of the fingers.  Thumbs.  Neck.  Lower back.  Knees.  Hips. CAUSES  Over time, the cartilage that covers the ends of bones begins to wear away. This causes bone to rub on bone, producing pain and stiffness in the affected joints.  RISK FACTORS Certain factors can increase your chances of having osteoarthritis, including:  Older age.  Excessive body weight.  Overuse of joints.  Previous joint injury. SIGNS AND SYMPTOMS   Pain, swelling, and stiffness in the joint.  Over time, the joint may lose its normal shape.  Small deposits of bone (osteophytes) may grow on the edges of the joint.  Bits of bone or cartilage can break off and float inside the joint space. This may cause more pain and damage. DIAGNOSIS  Your health care provider will do a physical exam and ask about your symptoms. Various tests may be ordered, such as:  X-rays of the affected joint.  Blood tests to rule out other types of arthritis. Additional tests may be used to diagnose your condition. TREATMENT  Goals of treatment are to control pain and improve joint function. Treatment plans may include:  A prescribed exercise program that allows for rest and joint relief.  A weight control plan.  Pain relief techniques, such as:  Properly  applied heat and cold.  Electric pulses delivered to nerve endings under the skin (transcutaneous electrical nerve stimulation [TENS]).  Massage.  Certain nutritional supplements.  Medicines to control pain, such as:  Acetaminophen.  Nonsteroidal anti-inflammatory drugs (NSAIDs), such as naproxen.  Narcotic or central-acting agents, such as tramadol.  Corticosteroids. These can be given orally or as an injection.  Surgery to reposition the bones and relieve pain (osteotomy) or to remove loose pieces of bone and cartilage. Joint replacement may be needed in advanced states of osteoarthritis. HOME CARE INSTRUCTIONS   Take medicines only as directed by your health care provider.  Maintain a healthy weight. Follow your health care provider's instructions for weight control. This may include dietary instructions.  Exercise as directed. Your health care provider can recommend specific types of exercise. These may include:  Strengthening exercises. These are done to strengthen the muscles that support joints affected by arthritis. They can be performed with weights or with exercise bands to add resistance.  Aerobic activities. These are exercises, such as brisk walking or low-impact aerobics, that get your heart pumping.  Range-of-motion activities. These keep your joints limber.  Balance and agility exercises. These help you maintain daily living skills.  Rest your affected joints as directed by your health care provider.  Keep all follow-up visits as directed by your health care provider. SEEK MEDICAL CARE IF:   Your skin turns red.  You develop a rash in addition to your joint pain.  You have worsening joint pain.  You have a  fever along with joint or muscle aches. SEEK IMMEDIATE MEDICAL CARE IF:  You have a significant loss of weight or appetite.  You have night sweats. [place] of Arthritis and Musculoskeletal and Skin Diseases:  www.niams.SouthExposed.es  [employer] on Aging: http://kim-miller.com/  American College of Rheumatology: www.rheumatology.org   This information is not intended to replace advice given to you by your health care provider. Make sure you discuss any questions you have with your health care provider.   Document Released: 09/23/2005 Document Revised: 10/14/2014 Document Reviewed: 05/31/2013 Elsevier Interactive Patient Education [employer].

## 2015-10-25 NOTE — Progress Notes (Signed)
   Subjective:    Patient ID: Dawn Lucas, female    DOB: [birth year], 60 y.o.   MRN: [id number]  HPI   This patient presents today complaining of bilateral arch pain and generalized foot pain for the past year is gradually become more uncomfortable with standing and walking. Patient has tried over-the-counter arch supports and shoe changes down relief of symptoms. Patient has history of fibromyalgia and generalized diffuse arthritis and is currently taking Celebrex for these problems. She also relates a weight reduction from 254 pounds to her current 213 pounds with slight reduction of foot pain. She presents with AP x-rays on a printed form that are most likely nonweightbearing without any other views.  Review of Systems  Cardiovascular: Positive for leg swelling.  Musculoskeletal: Positive for joint swelling and gait problem.       Objective:   Physical Exam  Orientated 3  Vascular: No peripheral edema bilaterally DP and PT pulses 2/4 bilaterally Capillary reflex immediate bilaterally  Neurological: Sensation to 10 g monofilament wire intact 5/5 bilaterally Vibratory sensation reactive bilaterally Ankle reflex equal and reactive bilaterally  Dermatological: Dry skin bilaterally No open skin lesions bilaterally  Musculoskeletal: Pes planus bilaterally Upon standing the heels are vertical and when patient toes off the heels invert, bilaterally Moderate severe HAV deformities bilaterally Ankle motion 90 with knee flexed and extended There is pain or crepitus and midtarsal, subtalar or MPJ bilaterally Diffuse mild palpable tenderness dorsal plantar aspect of right and left feet without any palpable lesions  X-ray examination weightbearing right foot dated 10/25/2015  Intact bony structure without fracture and/or dislocation HAV deformity right Posterior and inferior calcaneal spurs Slight decrease of right first MPJ joint space Wedging of navicular  Radiographic  impression: HAV deformity right Compression of navicular consistent with possible excessive pronation   X-ray examination weightbearing left foot dated 10/25/2015  Intact bony structures without any fracture and/or dislocation HAV left Inferior calcaneal spur Wedge-shaped navicular  Radiographic impression:  HAV right Wedge-shaped navicular consistent with possible excessive promontory motion No acute bony abnormality noted      Assessment & Plan:   Assessment: Satisfactory neurovascular status Pes planus bilaterally 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 The symptoms may be further aggravated by low-grade first MPJ arthritis and excessive promontory changes associated with pes planus I encouraged patient to continue weight loss and wear lace up style shoes possibly with steel shank. I thought patient might benefit from rigid orthotic, however, patient was advised that Medicaid had no benefits for these devices  Reappoint at patient's request

## 2016-02-28 ENCOUNTER — Ambulatory Visit (HOSPITAL_BASED_OUTPATIENT_CLINIC_OR_DEPARTMENT_OTHER): Payer: Medicaid Other | Attending: Internal Medicine | Admitting: Internal Medicine

## 2016-02-28 VITALS — Ht 62.0 in | Wt 217.0 lb

## 2016-02-28 DIAGNOSIS — G4730 Sleep apnea, unspecified: Secondary | ICD-10-CM

## 2016-02-28 DIAGNOSIS — G4733 Obstructive sleep apnea (adult) (pediatric): Secondary | ICD-10-CM | POA: Insufficient documentation

## 2016-02-28 DIAGNOSIS — I493 Ventricular premature depolarization: Secondary | ICD-10-CM | POA: Diagnosis not present

## 2016-03-04 DIAGNOSIS — G4730 Sleep apnea, unspecified: Secondary | ICD-10-CM

## 2016-03-04 NOTE — Procedures (Signed)
Patient Name: Nealy, Hickmon Date: 02/28/2016 Gender: Female D.O.B: April 08, 1956 Age (years): 28 Referring Provider: Nolene Ebbs Height (inches): 62 Interpreting Physician: Baird Lyons MD, ABSM Weight (lbs): 217 RPSGT: Zadie Rhine BMI: 40 MRN: [id number] Neck Size: 14.50 CLINICAL INFORMATION Sleep Study Type: Split Night CPAP   Indication for sleep study: obstructive sleep apnea   Epworth Sleepiness Score: 6 SLEEP STUDY TECHNIQUE As per the AASM Manual for the Scoring of Sleep and Associated Events v2.3 (April 2016) with a hypopnea requiring 4% desaturations. The channels recorded and monitored were frontal, central and occipital EEG, electrooculogram (EOG), submentalis EMG (chin), nasal and oral airflow, thoracic and abdominal wall motion, anterior tibialis EMG, snore microphone, electrocardiogram, and pulse oximetry. Continuous positive airway pressure (CPAP) was initiated when the patient met split night criteria and was titrated according to treat sleep-disordered breathing.  MEDICATIONS Medications taken by the patient : charted for review Medications administered by patient during sleep study : No sleep medicine administered.  RESPIRATORY PARAMETERS Diagnostic Total AHI (/hr): 38.6 RDI (/hr): 50.6 OA Index (/hr): 18.9 CA Index (/hr): 0.0 REM AHI (/hr): 64.9 NREM AHI (/hr): 31.3 Supine AHI (/hr): 33.9 Non-supine AHI (/hr): 38.93 Min O2 Sat (%): 80.00 Mean O2 (%): 92.35 Time below 88% (min): 8.9   Titration Optimal Pressure (cm): 7 AHI at Optimal Pressure (/hr): 0.0 Min O2 at Optimal Pressure (%): 92.0 Supine % at Optimal (%): 2 Sleep % at Optimal (%): 67    SLEEP ARCHITECTURE The recording time for the entire night was 461.2 minutes. During a baseline period of 262.4 minutes, the patient slept for 139.9 minutes in REM and nonREM, yielding a sleep efficiency of 53.3%. Sleep onset after lights out was 101.1 minutes with a REM latency of 56.5 minutes. The  patient spent 9.65% of the night in stage N1 sleep, 67.47% in stage N2 sleep, 1.07% in stage N3 and 21.81% in REM. During the titration period of 196.0 minutes, the patient slept for 112.0 minutes in REM and nonREM, yielding a sleep efficiency of 57.2%. Sleep onset after CPAP initiation was 11.8 minutes with a REM latency of 11.5 minutes. The patient spent 12.50% of the night in stage N1 sleep, 61.16% in stage N2 sleep, 0.00% in stage N3 and 26.34% in REM.  CARDIAC DATA The 2 lead EKG demonstrated sinus rhythm. The mean heart rate was 57.95 beats per minute. Other EKG findings include: PVCs.  LEG MOVEMENT DATA The total Periodic Limb Movements of Sleep (PLMS) were 17. The PLMS index was 4.03 .  IMPRESSIONS - Severe obstructive sleep apnea occurred during the diagnostic portion of the study (AHI = 38.6/hour). An optimal PAP pressure was selected for this patient ( 7 cm of water) - No significant central sleep apnea occurred during the diagnostic portion of the study (CAI = 0.0/hour). - Mild oxygen desaturation was noted during the diagnostic portion of the study (Min O2 = 80.00%). - No snoring was audible during the diagnostic portion of the study. - No cardiac abnormalities were noted during this study. - Clinically significant periodic limb movements did not occur during titration.  DIAGNOSIS - Obstructive Sleep Apnea (327.23 [G47.33 ICD-10])  RECOMMENDATIONS - Trial of CPAP therapy on 7 cm H2O with a Medium size Resmed Full Face Mask AirFit F20 mask and heated humidification. - Avoid alcohol, sedatives and other CNS depressants that may worsen sleep apnea and disrupt normal sleep architecture. - Sleep hygiene should be reviewed to assess factors that may improve sleep quality. - Weight management and  regular exercise should be initiated or continued.  Deneise Lever Diplomate, American Board of Sleep Medicine  ELECTRONICALLY SIGNED ON:  03/04/2016, 3:51 PM [place] PH: [phone]   FX: [phone] [place]

## 2016-09-11 ENCOUNTER — Ambulatory Visit: Payer: Medicaid Other | Admitting: Rheumatology

## 2016-09-18 ENCOUNTER — Other Ambulatory Visit: Payer: Self-pay | Admitting: *Deleted

## 2016-09-18 ENCOUNTER — Other Ambulatory Visit: Payer: Self-pay | Admitting: Internal Medicine

## 2016-09-18 DIAGNOSIS — Z1231 Encounter for screening mammogram for malignant neoplasm of breast: Secondary | ICD-10-CM

## 2016-10-17 ENCOUNTER — Ambulatory Visit (INDEPENDENT_AMBULATORY_CARE_PROVIDER_SITE_OTHER): Payer: Medicaid Other | Admitting: Rheumatology

## 2016-10-17 ENCOUNTER — Encounter: Payer: Self-pay | Admitting: Rheumatology

## 2016-10-17 VITALS — BP 148/92 | HR 88 | Resp 18 | Ht 62.0 in | Wt 223.0 lb

## 2016-10-17 DIAGNOSIS — M79642 Pain in left hand: Secondary | ICD-10-CM | POA: Diagnosis not present

## 2016-10-17 DIAGNOSIS — M79672 Pain in left foot: Secondary | ICD-10-CM

## 2016-10-17 DIAGNOSIS — M25571 Pain in right ankle and joints of right foot: Secondary | ICD-10-CM

## 2016-10-17 DIAGNOSIS — M79671 Pain in right foot: Secondary | ICD-10-CM

## 2016-10-17 DIAGNOSIS — F5101 Primary insomnia: Secondary | ICD-10-CM

## 2016-10-17 DIAGNOSIS — R5382 Chronic fatigue, unspecified: Secondary | ICD-10-CM | POA: Diagnosis not present

## 2016-10-17 DIAGNOSIS — M79641 Pain in right hand: Secondary | ICD-10-CM

## 2016-10-17 DIAGNOSIS — F17210 Nicotine dependence, cigarettes, uncomplicated: Secondary | ICD-10-CM | POA: Diagnosis not present

## 2016-10-17 DIAGNOSIS — M797 Fibromyalgia: Secondary | ICD-10-CM | POA: Diagnosis not present

## 2016-10-17 DIAGNOSIS — M25572 Pain in left ankle and joints of left foot: Secondary | ICD-10-CM

## 2016-10-17 LAB — CBC WITH DIFFERENTIAL/PLATELET
Basophils Absolute: 0 cells/uL (ref 0–200)
Basophils Relative: 0 %
Eosinophils Absolute: 174 cells/uL (ref 15–500)
Eosinophils Relative: 2 %
HCT: 41.3 % (ref 35.0–45.0)
Hemoglobin: 13.6 g/dL (ref 11.7–15.5)
Lymphocytes Relative: 42 %
Lymphs Abs: 3654 cells/uL (ref 850–3900)
MCH: 28.5 pg (ref 27.0–33.0)
MCHC: 32.9 g/dL (ref 32.0–36.0)
MCV: 86.6 fL (ref 80.0–100.0)
MPV: 10.1 fL (ref 7.5–12.5)
Monocytes Absolute: 522 cells/uL (ref 200–950)
Monocytes Relative: 6 %
Neutro Abs: 4350 cells/uL (ref 1500–7800)
Neutrophils Relative %: 50 %
Platelets: 335 10*3/uL (ref 140–400)
RBC: 4.77 MIL/uL (ref 3.80–5.10)
RDW: 14.7 % (ref 11.0–15.0)
WBC: 8.7 10*3/uL (ref 3.8–10.8)

## 2016-10-17 MED ORDER — CELECOXIB 200 MG PO CAPS: 200.0000 mg | ORAL_CAPSULE | Freq: Two times a day (BID) | ORAL | 5 refills | Status: AC

## 2016-10-17 NOTE — Progress Notes (Signed)
Office Visit Note  Patient: Dawn Lucas             Date of Birth: 1955/12/19           MRN: [id number]             PCP: Philis Fendt, MD Referring: Nolene Ebbs, MD Visit Date: 10/17/2016 Occupation: @GUAROCC @    Subjective:  Pain of the Spine; Follow-up (Medicaid will not cover Celebrex or Meloxicam wants something else); and Muscle Pain (everything is hurting today )   History of Present Illness: Dawn Lucas is a 61 y.o. female  Last seen 06/13/2016. Patient's fibromyalgia is flaring. Patient states it is due to the cold weather. It is also going to rain today and she states that the rain is causing her the flare as well.  She is hurting all over. She is very sensitive to touch. She doesn't want to be examined secondary to the severe pain that she feels when she is touched.  She is not able to get her meloxicam or her Celebrex because Medicaid will not cover it. She has a very good pharmacists at [facility] number: [phone] [place], [place] [address].  Patient states that this pharmacy will come to her house to deliver her medicine since she has a hard time getting out of the house. She also received a shingles vaccine through this pharmacy. They can draw her house and gave it to her. She received a shingles vaccine November 2017. She got the flu shot December 2017. These are approximate dates but this is very close to the actual date according to the patient.  Today, she is requesting some medication to help her with her pain. Note that Celebrex is not possible but meloxicam may be affordable for the patient even if Medicaid does not cover it.   Activities of Daily Living:  Patient reports morning stiffness for 30 minutes.   Patient Reports nocturnal pain.  Difficulty dressing/grooming: Reports Difficulty climbing stairs: Reports Difficulty getting out of chair: Reports Difficulty using hands for taps, buttons, cutlery,  and/or writing: Reports   Review of Systems  Constitutional: Positive for fatigue.  HENT: Negative for mouth sores and mouth dryness.   Eyes: Negative for dryness.  Respiratory: Negative for shortness of breath.   Gastrointestinal: Negative for constipation and diarrhea.  Musculoskeletal: Positive for myalgias and myalgias.  Skin: Negative for sensitivity to sunlight.  Psychiatric/Behavioral: Positive for sleep disturbance. Negative for decreased concentration.    PMFS History:  Patient Active Problem List   Diagnosis Date Noted  . Prediabetes 04/28/2015  . Pes planus of both feet 03/28/2015  . Seborrheic keratoses 08/22/2014  . Headache(784.0) 05/17/2014  . Lower extremity edema 09/22/2013  . Vitamin D deficiency 09/22/2013  . Tendinitis of left rotator cuff 06/17/2013  . Right ankle pain 06/17/2013  . Colon cancer screening 06/17/2013  . Knee pain, bilateral 03/20/2012  . Hypertension 11/04/2011  . Generalized pain 10/15/2011  . Back pain 02/08/2011  . Obese 02/08/2011  . Tobacco abuse 02/08/2011  . Sleep disorder 02/08/2011    Past Medical History:  Diagnosis Date  . Anemia   . Back pain   . Fibromyalgia   . Hypertension 11/04/2011  . UTI (lower urinary tract infection)     Family History  Problem Relation Age of Onset  . Colon cancer Mother   . Diabetes type II Maternal Grandmother   . Diabetes type II Paternal Grandmother  Past Surgical History:  Procedure Laterality Date  . CHOLECYSTECTOMY    . OVARIAN CYST REMOVAL     Social History   Social History Narrative  . No narrative on file     Objective: Vital Signs: BP (!) 148/92   Pulse 88   Resp 18   Ht 5\' 2"  (1.575 m)   Wt 223 lb (101.2 kg)   BMI 40.79 kg/m    Physical Exam  Constitutional: She is oriented to person, place, and time. She appears well-developed and well-nourished.  HENT:  Head: Normocephalic and atraumatic.  Eyes: EOM are normal. Pupils are equal, round, and reactive to  light.  Cardiovascular: Normal rate, regular rhythm and normal heart sounds.  Exam reveals no gallop and no friction rub.   No murmur heard. Pulmonary/Chest: Effort normal and breath sounds normal. She has no wheezes. She has no rales.  Abdominal: Soft. Bowel sounds are normal. She exhibits no distension. There is no tenderness. There is no guarding. No hernia.  Musculoskeletal: Normal range of motion. She exhibits no edema, tenderness or deformity.  Lymphadenopathy:    She has no cervical adenopathy.  Neurological: She is alert and oriented to person, place, and time. Coordination normal.  Skin: Skin is warm and dry. Capillary refill takes less than 2 seconds. No rash noted.  Psychiatric: She has a normal mood and affect. Her behavior is normal.  Nursing note and vitals reviewed.    Musculoskeletal Exam: Full range of motion of all joints except 100 abduction left shoulder joint (hx of same). Grip strength is equal and strong bilaterally Fibromyalgia tender points are 18 out of 18 positive. Note patient states "everything hurts today" because of the rain that's coming in today.  CDAI Exam: No CDAI exam completed.  No synovitis  Investigation: No additional findings.   Imaging: No results found.  Speciality Comments: No specialty comments available.    Procedures:  No procedures performed Allergies: Quinolones and Sulfa antibiotics   Assessment / Plan:     Visit Diagnoses: Fibromyalgia  Chronic fatigue  Primary insomnia   Plan:  #1 fiber myalgia syndrome. Hurting all over. Unable to use Celebrex because she cannot afford it. She was told by somebody that is cost her over $200. She cannot pay for it. I called [facility] and discussed with the clinic director/pharmacist her situation. He told me that because she has Medicaid, he will only cost her $3 for 30 days supply of Celebrex which Medicaid doesn't pay for. Patient was in the room with me and I confirmed with  her if this is okay to prescribe and she was agreeable. As a result I refilled her Celebrex 200 mg 1 twice a day when necessary; 30 day supply with 5 refills. I called in this prescription already to [facility]  #2: Stop meloxicam. I worry that the patient will have GI bleed in the future and if she gets benefit from Celebrex I would prefer refer that.  #3: While I was on the phone patient told her pharmacist that she wanted her muscle relaxer refilled. So according to the pharmacist that she has enough refills left for her muscle relaxer  #4: We discussed smoking cessation. Asian states that she is smoking less than half pack per day.  #5: Return to clinic in 6 months  #6: I advised the patient to try to be active as possible to minimize fiber flares.  #7: OA of hands, OA of feet, ongoing hand and feet pain.  Orders: No orders of the defined types were placed in this encounter.  No orders of the defined types were placed in this encounter.   Face-to-face time spent with patient was 30 minutes. 50% of time was spent in counseling and coordination of care.  Follow-Up Instructions: No Follow-up on file.   Eliezer Lofts, PA-C  Note - This record has been created using [employer].  Chart creation errors have been sought, but may not always  have been located. Such creation errors do not reflect on  the standard of medical care.

## 2016-10-18 LAB — COMPLETE METABOLIC PANEL WITH GFR
ALT: 14 U/L (ref 6–29)
AST: 14 U/L (ref 10–35)
Albumin: 4.3 g/dL (ref 3.6–5.1)
Alkaline Phosphatase: 57 U/L (ref 33–130)
BUN: 13 mg/dL (ref 7–25)
CO2: 23 mmol/L (ref 20–31)
Calcium: 9.9 mg/dL (ref 8.6–10.4)
Chloride: 105 mmol/L (ref 98–110)
Creat: 1.03 mg/dL — ABNORMAL HIGH (ref 0.50–0.99)
GFR, Est African American: 68 mL/min (ref >=60)
GFR, Est Non African American: 59 mL/min — ABNORMAL LOW (ref >=60)
Glucose, Bld: 114 mg/dL — ABNORMAL HIGH (ref 65–99)
Potassium: 4.6 mmol/L (ref 3.5–5.3)
Sodium: 142 mmol/L (ref 135–146)
Total Bilirubin: 0.7 mg/dL (ref 0.2–1.2)
Total Protein: 7.3 g/dL (ref 6.1–8.1)

## 2016-10-18 LAB — VITAMIN D 25 HYDROXY (VIT D DEFICIENCY, FRACTURES): Vit D, 25-Hydroxy: 32 ng/mL (ref 30–100)

## 2016-10-23 ENCOUNTER — Ambulatory Visit: Payer: Medicaid Other

## 2016-11-05 ENCOUNTER — Ambulatory Visit
Admission: RE | Admit: 2016-11-05 | Discharge: 2016-11-05 | Disposition: A | Discharge status: Home / Self Care | Payer: Medicaid Other | Source: Ambulatory Visit | Attending: Internal Medicine | Admitting: Internal Medicine

## 2016-11-05 DIAGNOSIS — Z1231 Encounter for screening mammogram for malignant neoplasm of breast: Secondary | ICD-10-CM

## 2017-04-17 ENCOUNTER — Ambulatory Visit (INDEPENDENT_AMBULATORY_CARE_PROVIDER_SITE_OTHER): Payer: Medicaid Other

## 2017-04-17 ENCOUNTER — Ambulatory Visit (INDEPENDENT_AMBULATORY_CARE_PROVIDER_SITE_OTHER): Payer: Medicaid Other | Admitting: Rheumatology

## 2017-04-17 ENCOUNTER — Encounter: Payer: Self-pay | Admitting: Rheumatology

## 2017-04-17 VITALS — BP 142/92 | HR 78 | Resp 18 | Ht 62.5 in | Wt 234.0 lb

## 2017-04-17 DIAGNOSIS — M79642 Pain in left hand: Secondary | ICD-10-CM | POA: Diagnosis not present

## 2017-04-17 DIAGNOSIS — F5101 Primary insomnia: Secondary | ICD-10-CM | POA: Diagnosis not present

## 2017-04-17 DIAGNOSIS — M25571 Pain in right ankle and joints of right foot: Secondary | ICD-10-CM

## 2017-04-17 DIAGNOSIS — M79641 Pain in right hand: Secondary | ICD-10-CM | POA: Diagnosis not present

## 2017-04-17 DIAGNOSIS — M797 Fibromyalgia: Secondary | ICD-10-CM | POA: Diagnosis not present

## 2017-04-17 DIAGNOSIS — M25572 Pain in left ankle and joints of left foot: Secondary | ICD-10-CM

## 2017-04-17 DIAGNOSIS — M79672 Pain in left foot: Secondary | ICD-10-CM

## 2017-04-17 DIAGNOSIS — R5382 Chronic fatigue, unspecified: Secondary | ICD-10-CM

## 2017-04-17 DIAGNOSIS — M79671 Pain in right foot: Secondary | ICD-10-CM | POA: Diagnosis not present

## 2017-04-17 DIAGNOSIS — M62838 Other muscle spasm: Secondary | ICD-10-CM

## 2017-04-17 MED ORDER — LIDOCAINE HCL 1 % IJ SOLN
0.3000 mL | INTRAMUSCULAR | Status: AC | PRN
  Administered 2017-04-17: .3 mL

## 2017-04-17 MED ORDER — TRIAMCINOLONE ACETONIDE 40 MG/ML IJ SUSP
10.0000 mg | INTRAMUSCULAR | Status: AC | PRN
  Administered 2017-04-17: 10 mg via INTRAMUSCULAR

## 2017-04-17 MED ORDER — BACLOFEN 10 MG PO TABS: 10.0000 mg | ORAL_TABLET | Freq: Two times a day (BID) | ORAL | 2 refills | Status: DC

## 2017-04-17 MED ORDER — TIZANIDINE HCL 4 MG PO TABS: 4.0000 mg | ORAL_TABLET | Freq: Every day | ORAL | 2 refills | Status: DC

## 2017-04-17 NOTE — Progress Notes (Signed)
Office Visit Note  Patient: Dawn Lucas             Date of Birth: [birth year]           MRN: [id number]             PCP: Nolene Ebbs, MD Referring: Nolene Ebbs, MD Visit Date: 04/17/2017 Occupation: @GUAROCC @    Subjective:  Pain of the Left Shoulder; Pain of the Right Ankle; and Fatigue   History of Present Illness: Dawn Lucas is a 61 y.o. female  Who was last seen in our office on 10/17/2016 for fibromyalgia, medication management.   Today, patient is complaining that her left neck left trapezius left shoulder and anterior side of her shoulder is painful. This problem started today when she woke up. No falls or injuries. In the past, she's had pain similar to this but on both sides but today just on the left side. She reports that she is getting a lot of cramps in her neck which may have exacerbated this. Patient rates her pain as 5 on a scale of 0-10 currently.  C/o rt ankle pain that causes pt to limp and get off balance where she almost falls because of that. Patient reports that this is been going on for couple of months. It's gotten worse over the last 1 or 2 weeks.  Activities of Daily Living:  Patient reports morning stiffness for 30 minutes.   Patient Reports nocturnal pain.  Difficulty dressing/grooming: Reports Difficulty climbing stairs: Reports Difficulty getting out of chair: Reports Difficulty using hands for taps, buttons, cutlery, and/or writing: Reports   Review of Systems  Constitutional: Positive for fatigue.  HENT: Negative for mouth sores and mouth dryness.   Eyes: Negative for dryness.  Respiratory: Negative for shortness of breath.   Gastrointestinal: Negative for constipation and diarrhea.  Musculoskeletal: Positive for arthralgias (rt lateral ankle pain), joint pain (rt lateral ankle pain), myalgias and myalgias.  Skin: Negative for sensitivity to sunlight.  Psychiatric/Behavioral: Positive for sleep disturbance. Negative for decreased  concentration.    PMFS History:  Patient Active Problem List   Diagnosis Date Noted  . Prediabetes 04/28/2015  . Pes planus of both feet 03/28/2015  . Seborrheic keratoses 08/22/2014  . Headache(784.0) 05/17/2014  . Lower extremity edema 09/22/2013  . Vitamin D deficiency 09/22/2013  . Tendinitis of left rotator cuff 06/17/2013  . Right ankle pain 06/17/2013  . Colon cancer screening 06/17/2013  . Knee pain, bilateral 03/20/2012  . Hypertension 11/04/2011  . Generalized pain 10/15/2011  . Back pain 02/08/2011  . Obese 02/08/2011  . Tobacco abuse 02/08/2011  . Sleep disorder 02/08/2011    Past Medical History:  Diagnosis Date  . Anemia   . Back pain   . Fibromyalgia   . Hypertension 11/04/2011  . UTI (lower urinary tract infection)     Family History  Problem Relation Age of Onset  . Colon cancer Mother   . Diabetes type II Maternal Grandmother   . Diabetes type II Paternal Grandmother    Past Surgical History:  Procedure Laterality Date  . CHOLECYSTECTOMY    . OVARIAN CYST REMOVAL     Social History   Social History Narrative  . No narrative on file     Objective: Vital Signs: BP (!) 142/92   Pulse 78   Resp 18   Ht 5' 2.5" (1.588 m)   Wt 234 lb (106.1 kg)   BMI 42.12 kg/m    Physical Exam  Constitutional:  She is oriented to person, place, and time. She appears well-developed and well-nourished.  HENT:  Head: Normocephalic and atraumatic.  Eyes: Pupils are equal, round, and reactive to light. EOM are normal.  Cardiovascular: Normal rate, regular rhythm and normal heart sounds.  Exam reveals no gallop and no friction rub.   No murmur heard. Pulmonary/Chest: Effort normal and breath sounds normal. She has no wheezes. She has no rales.  Abdominal: Soft. Bowel sounds are normal. She exhibits no distension. There is no tenderness. There is no guarding. No hernia.  Musculoskeletal: Normal range of motion. She exhibits tenderness (rt ankle tenderness). She  exhibits no edema or deformity.  Lymphadenopathy:    She has no cervical adenopathy.  Neurological: She is alert and oriented to person, place, and time. Coordination normal.  Skin: Skin is warm and dry. Capillary refill takes less than 2 seconds. No rash noted.  Psychiatric: She has a normal mood and affect. Her behavior is normal.  Nursing note and vitals reviewed.    Musculoskeletal Exam:  Full range of motion of all joints Grip strength is equal and strong bilaterally Fiber myalgia tender points are 18 out of 18 positive  CDAI Exam: CDAI Homunculus Exam:   Joint Counts:  CDAI Tender Joint count: 0 CDAI Swollen Joint count: 0  Global Assessments:  Patient Global Assessment: 5 Provider Global Assessment: 5  CDAI Calculated Score: 10  No synovitis on exam  Investigation: No additional findings. No visits with results within 6 Month(s) from this visit.  Latest known visit with results is:  Office Visit on 10/17/2016  Component Date Value Ref Range Status  . WBC 10/17/2016 8.7  3.8 - 10.8 K/uL Final  . RBC 10/17/2016 4.77  3.80 - 5.10 MIL/uL Final  . Hemoglobin 10/17/2016 13.6  11.7 - 15.5 g/dL Final  . HCT 10/17/2016 41.3  35.0 - 45.0 % Final  . MCV 10/17/2016 86.6  80.0 - 100.0 fL Final  . MCH 10/17/2016 28.5  27.0 - 33.0 pg Final  . MCHC 10/17/2016 32.9  32.0 - 36.0 g/dL Final  . RDW 10/17/2016 14.7  11.0 - 15.0 % Final  . Platelets 10/17/2016 335  140 - 400 K/uL Final  . MPV 10/17/2016 10.1  7.5 - 12.5 fL Final  . Neutro Abs 10/17/2016 4350  1,500 - 7,800 cells/uL Final  . Lymphs Abs 10/17/2016 3654  850 - 3,900 cells/uL Final  . Monocytes Absolute 10/17/2016 522  200 - 950 cells/uL Final  . Eosinophils Absolute 10/17/2016 174  15 - 500 cells/uL Final  . Basophils Absolute 10/17/2016 0  0 - 200 cells/uL Final  . Neutrophils Relative % 10/17/2016 50  % Final  . Lymphocytes Relative 10/17/2016 42  % Final  . Monocytes Relative 10/17/2016 6  % Final  . Eosinophils  Relative 10/17/2016 2  % Final  . Basophils Relative 10/17/2016 0  % Final  . Smear Review 10/17/2016 Criteria for review not met   Final  . Sodium 10/17/2016 142  135 - 146 mmol/L Final  . Potassium 10/17/2016 4.6  3.5 - 5.3 mmol/L Final  . Chloride 10/17/2016 105  98 - 110 mmol/L Final  . CO2 10/17/2016 23  20 - 31 mmol/L Final  . Glucose, Bld 10/17/2016 114* 65 - 99 mg/dL Final  . BUN 10/17/2016 13  7 - 25 mg/dL Final  . Creat 10/17/2016 1.03* 0.50 - 0.99 mg/dL Final   Comment:   For patients > or = 62 years of age: The  upper reference limit for Creatinine is approximately 13% higher for people identified as African-American.     . Total Bilirubin 10/17/2016 0.7  0.2 - 1.2 mg/dL Final  . Alkaline Phosphatase 10/17/2016 57  33 - 130 U/L Final  . AST 10/17/2016 14  10 - 35 U/L Final  . ALT 10/17/2016 14  6 - 29 U/L Final  . Total Protein 10/17/2016 7.3  6.1 - 8.1 g/dL Final  . Albumin 10/17/2016 4.3  3.6 - 5.1 g/dL Final  . Calcium 10/17/2016 9.9  8.6 - 10.4 mg/dL Final  . GFR, Est African American 10/17/2016 68  >=60 mL/min Final  . GFR, Est Non African American 10/17/2016 59* >=60 mL/min Final  . Vit D, 25-Hydroxy 10/17/2016 32  30 - 100 ng/mL Final   Comment: Vitamin D Status           25-OH Vitamin D        Deficiency                <20 ng/mL        Insufficiency         20 - 29 ng/mL        Optimal             > or = 30 ng/mL   For 25-OH Vitamin D testing on patients on D2-supplementation and patients for whom quantitation of D2 and D3 fractions is required, the QuestAssureD 25-OH VIT D, (D2,D3), LC/MS/MS is recommended: order code [id number] (patients > 2 yrs).      Imaging: Xr Ankle 2 Views Right  Result Date: 04/17/2017 X-ray right ankle 2 views #1: No fracture noted to the right ankle area #2: No other abnormality noted. #3: Soft tissue swelling noted to the lateral aspect of the right ankle. Impression: Normal right ankle x-ray   Speciality Comments: No specialty  comments available.  Procedures:  Trigger Point Inj Date/Time: 04/17/2017 1:19 PM Performed by: Eliezer Lofts Authorized by: Eliezer Lofts   Consent Given by:  Patient Site marked: the procedure site was marked   Timeout: prior to procedure the correct patient, procedure, and site was verified   Indications:  Muscle spasm and pain Total # of Trigger Points:  2 Location: neck   Needle Size:  27 G Approach:  Dorsal Medications #1:  0.3 mL lidocaine 1 %; 10 mg triamcinolone acetonide 40 MG/ML Medications #2:  0.3 mL lidocaine 1 %; 10 mg triamcinolone acetonide 40 MG/ML Patient tolerance:  Patient tolerated the procedure well with no immediate complications Comments: 10 out of 10 pain    Allergies: Quinolones and Sulfa antibiotics   Assessment / Plan:     Visit Diagnoses: Fibromyalgia  Trapezius muscle spasm  Chronic fatigue  Primary insomnia  Bilateral hand pain  Pain in joints of both feet  Pain in right ankle and joints of right foot - Plan: XR Ankle 2 Views Right   Plan: #1: Fibromyalgia. 18 out of 18 tender points. Rates her discomfort is about a 7 on a scale of 0-10 most days. He can flare when patient is stressed out and he can go as high as a 10 on a scale of 0-10.  #2: Fatigue Ongoing.  #3: Insomnia. Patient gets good relief with titer tizanidine. Request a refill  #4: Bilateral trapezius muscle spasms. Patient is having trouble moving her shoulder and her neck secondary to the muscle spasm. 0.3 mils of 2% lidocaine without epinephrine mixed with 10 mg of Kenalog and injected  into each trapezius area. Prior to the injection, patient rated her pain as a 10 on a scale of 0-10. The injection, she stated her pain is down to a 1 on a scale of 0-10.  #5: Return to clinic in 6 months  #6: Right ankle pain for the last 2 weeks. I will x-ray her right ankle/foot. Patient states that she did not fall and injure it recently however She reports that she is  injured it about a year ago.   Orders: Orders Placed This Encounter  Procedures  . Trigger Point Injection  . XR Ankle 2 Views Right   Meds ordered this encounter  Medications  . tiZANidine (ZANAFLEX) 4 MG tablet    Sig: Take 1 tablet (4 mg total) by mouth at bedtime.    Dispense:  30 tablet    Refill:  2    Order Specific Question:   Supervising Provider    Answer:   Bo Merino [[id number]]  . baclofen (LIORESAL) 10 MG tablet    Sig: Take 1 tablet (10 mg total) by mouth 2 (two) times daily.    Dispense:  30 each    Refill:  2    Order Specific Question:   Supervising Provider    Answer:   Bo Merino [phone]    Face-to-face time spent with patient was 30 minutes. Greater than 50% of time was spent in counseling and coordination of care.  Follow-Up Instructions: Return in about 6 months (around 10/18/2017) for FMS, FATIGUE,INSOMNIA, rt ankle pain.   Eliezer Lofts, PA-C  Note - This record has been created using [employer].  Chart creation errors have been sought, but may not always  have been located. Such creation errors do not reflect on  the standard of medical care.

## 2017-04-17 NOTE — Patient Instructions (Signed)
  Right ankle pain for 2 weeks X-rays are negative Patient advised to make appointment with Dr. Durward Fortes at next available.

## 2017-04-21 ENCOUNTER — Encounter (INDEPENDENT_AMBULATORY_CARE_PROVIDER_SITE_OTHER): Payer: Self-pay | Admitting: Orthopaedic Surgery

## 2017-04-21 ENCOUNTER — Ambulatory Visit (INDEPENDENT_AMBULATORY_CARE_PROVIDER_SITE_OTHER): Payer: Medicaid Other | Admitting: Orthopaedic Surgery

## 2017-04-21 VITALS — BP 133/80 | HR 55 | Resp 16 | Ht 62.5 in | Wt 234.0 lb

## 2017-04-21 DIAGNOSIS — M79671 Pain in right foot: Secondary | ICD-10-CM | POA: Diagnosis not present

## 2017-04-21 NOTE — Progress Notes (Signed)
Office Visit Note   Patient: Dawn Lucas           Date of Birth: 1955/10/16           MRN: [id number] Visit Date: 04/21/2017              Requested by: Nolene Ebbs, MD [address] [place], [place] [address] PCP: Nolene Ebbs, MD   Assessment & Plan: Visit Diagnoses:  1. Right foot pain   Probable chronic lateral ankle sprain. Active fibromyalgia. Midfoot arthritis  Plan: ASO ankle support, over-the-counter meds as needed. Return to office 4-6 weeks if no improvement and consider MRI scan  Follow-Up Instructions: Return if symptoms worsen or fail to improve.   Orders:  No orders of the defined types were placed in this encounter.  No orders of the defined types were placed in this encounter.     Procedures: No procedures performed   Clinical Data: No additional findings.   Subjective: Chief Complaint  Patient presents with  . Right Ankle - Pain  . Ankle Pain    Right ankle pain x 1 Lucas, fell down stairs, swelling, difficulty walking, limping,   Dawn Lucas. She's been seen by the podiatrist. Films of her foot reveals some degenerative changes. She did have a injury over a Lucas ago where she fell down some stairs and possibly had an exacerbation. Also recently been having more trouble with the lateral aspect of her ankle. She did have x-rays to Dr. Arlean Hopping office recently that were negative for any obvious fracture. Pain seems to be worse when she is up and about and active. She is not aware that she's had any swelling. No skin changes. No numbness or tingling. I did review films of her ankle on the PACS system and didn't see any particular abnormalities to suggest arthritis, loose bodies or fracture.  HPI  Review of Systems   Objective: Vital Signs: BP 133/80 (BP Location: Left Arm, Patient Position: Sitting, Cuff Size: Normal)   Pulse (!) 55   Resp 16   Ht 5' 2.5" (1.588 m)    Wt 234 lb (106.1 kg)   BMI 42.12 kg/m   Physical Exam  Ortho Exam right ankle exam without evidence of edema. No ecchymosis. Neurovascular exam intact. Pulses +1. Many areas of tenderness about the ankle and the foot and even the distal aspect of her leg which she attributes to her fibromyalgia. I asked her to point to the area of most tenderness and appeared to be in the area of the anterior talofibular and fibulocalcaneal ligaments. No evidence of instability. Negative anterior drawer sign. No pain behind either malleolus or at either malleolus.  Specialty Comments:  No specialty comments available.  Imaging: No results found.   PMFS History: Patient Active Problem List   Diagnosis Date Noted  . Prediabetes 04/28/2015  . Pes planus of both feet 03/28/2015  . Seborrheic keratoses 08/22/2014  . Headache(784.0) 05/17/2014  . Lower extremity edema 09/22/2013  . Vitamin D deficiency 09/22/2013  . Tendinitis of left rotator cuff 06/17/2013  . Right ankle pain 06/17/2013  . Colon cancer screening 06/17/2013  . Knee pain, bilateral 03/20/2012  . Hypertension 11/04/2011  . Generalized pain 10/15/2011  . Back pain 02/08/2011  . Obese 02/08/2011  . Tobacco abuse 02/08/2011  . Sleep disorder 02/08/2011   Past Medical History:  Diagnosis Date  . Anemia   . Arthritis   .  Back pain   . Fibromyalgia   . Hypertension 11/04/2011  . Sleep apnea   . UTI (lower urinary tract infection)     Family History  Problem Relation Age of Onset  . Colon cancer Mother   . Diabetes type II Maternal Grandmother   . Diabetes type II Paternal Grandmother     Past Surgical History:  Procedure Laterality Date  . CHOLECYSTECTOMY    . OVARIAN CYST REMOVAL     Social History   Occupational History  . Not on file.   Social History Main Topics  . Smoking status: Current Every Day Smoker    Packs/day: 0.30    Years: 45.00    Types: Cigarettes  . Smokeless tobacco: Former Systems developer    Types: Snuff,  Chew  . Alcohol use 1.2 oz/week    1 Glasses of wine, 1 Cans of beer per week  . Drug use: No  . Sexual activity: Not Currently    Birth control/ protection: Abstinence     Garald Balding, MD   Note - This record has been created using [employer].  Chart creation errors have been sought, but may not always  have been located. Such creation errors do not reflect on  the standard of medical care.

## 2017-04-22 ENCOUNTER — Ambulatory Visit (INDEPENDENT_AMBULATORY_CARE_PROVIDER_SITE_OTHER): Payer: Medicaid Other | Admitting: Orthopaedic Surgery

## 2017-07-25 ENCOUNTER — Other Ambulatory Visit: Payer: Self-pay | Admitting: Rheumatology

## 2017-07-25 NOTE — Telephone Encounter (Signed)
Last Visit: 04/17/17  Next Visit: 10/21/17  Okay to refill per Dr. Estanislado Pandy

## 2017-08-06 ENCOUNTER — Ambulatory Visit (INDEPENDENT_AMBULATORY_CARE_PROVIDER_SITE_OTHER): Payer: Medicaid Other | Admitting: Obstetrics

## 2017-08-06 ENCOUNTER — Encounter: Payer: Self-pay | Admitting: Obstetrics

## 2017-08-06 ENCOUNTER — Other Ambulatory Visit (HOSPITAL_COMMUNITY)
Admission: RE | Admit: 2017-08-06 | Discharge: 2017-08-06 | Disposition: A | Discharge status: Home / Self Care | Payer: Medicaid Other | Source: Ambulatory Visit | Attending: Obstetrics | Admitting: Obstetrics

## 2017-08-06 VITALS — BP 152/87 | HR 69 | Ht 62.5 in | Wt 234.8 lb

## 2017-08-06 DIAGNOSIS — Z01411 Encounter for gynecological examination (general) (routine) with abnormal findings: Secondary | ICD-10-CM | POA: Insufficient documentation

## 2017-08-06 DIAGNOSIS — G4730 Sleep apnea, unspecified: Secondary | ICD-10-CM | POA: Insufficient documentation

## 2017-08-06 DIAGNOSIS — M1990 Unspecified osteoarthritis, unspecified site: Secondary | ICD-10-CM | POA: Diagnosis not present

## 2017-08-06 DIAGNOSIS — Z6841 Body Mass Index (BMI) 40.0 and over, adult: Secondary | ICD-10-CM

## 2017-08-06 DIAGNOSIS — Z9049 Acquired absence of other specified parts of digestive tract: Secondary | ICD-10-CM | POA: Insufficient documentation

## 2017-08-06 DIAGNOSIS — Z888 Allergy status to other drugs, medicaments and biological substances status: Secondary | ICD-10-CM | POA: Insufficient documentation

## 2017-08-06 DIAGNOSIS — E66813 Obesity, class 3: Secondary | ICD-10-CM

## 2017-08-06 DIAGNOSIS — Z7951 Long term (current) use of inhaled steroids: Secondary | ICD-10-CM | POA: Diagnosis not present

## 2017-08-06 DIAGNOSIS — Z800 Family history of malignant neoplasm of digestive organs: Secondary | ICD-10-CM | POA: Insufficient documentation

## 2017-08-06 DIAGNOSIS — R102 Pelvic and perineal pain unspecified side: Secondary | ICD-10-CM

## 2017-08-06 DIAGNOSIS — Z882 Allergy status to sulfonamides status: Secondary | ICD-10-CM | POA: Diagnosis not present

## 2017-08-06 DIAGNOSIS — Z0000 Encounter for general adult medical examination without abnormal findings: Secondary | ICD-10-CM | POA: Diagnosis not present

## 2017-08-06 DIAGNOSIS — I10 Essential (primary) hypertension: Secondary | ICD-10-CM | POA: Diagnosis not present

## 2017-08-06 DIAGNOSIS — Z01419 Encounter for gynecological examination (general) (routine) without abnormal findings: Secondary | ICD-10-CM | POA: Diagnosis not present

## 2017-08-06 DIAGNOSIS — N898 Other specified noninflammatory disorders of vagina: Secondary | ICD-10-CM

## 2017-08-06 DIAGNOSIS — Z1151 Encounter for screening for human papillomavirus (HPV): Secondary | ICD-10-CM | POA: Diagnosis not present

## 2017-08-06 DIAGNOSIS — Z79899 Other long term (current) drug therapy: Secondary | ICD-10-CM | POA: Diagnosis not present

## 2017-08-06 DIAGNOSIS — M797 Fibromyalgia: Secondary | ICD-10-CM | POA: Diagnosis not present

## 2017-08-06 DIAGNOSIS — F17210 Nicotine dependence, cigarettes, uncomplicated: Secondary | ICD-10-CM | POA: Insufficient documentation

## 2017-08-06 DIAGNOSIS — Z87440 Personal history of urinary (tract) infections: Secondary | ICD-10-CM | POA: Diagnosis not present

## 2017-08-06 NOTE — Progress Notes (Addendum)
Subjective:        Dawn Lucas is a 61 y.o. female here for a routine exam.  Current complaints: Pelvic pain and pain with intercourse.    Personal health questionnaire:  Is patient Ashkenazi Jewish, have a family history of breast and/or ovarian cancer: no Is there a family history of uterine cancer diagnosed at age < 5, gastrointestinal cancer, urinary tract cancer, family member who is a Field seismologist syndrome-associated carrier: yes Is the patient overweight and hypertensive, family history of diabetes, personal history of gestational diabetes, preeclampsia or PCOS: yes Is patient over 39, have PCOS,  family history of premature CHD under age 14, diabetes, smoke, have hypertension or peripheral artery disease:  yes At any time, has a partner hit, kicked or otherwise hurt or frightened you?: no Over the past 2 weeks, have you felt down, depressed or hopeless?: no Over the past 2 weeks, have you felt little interest or pleasure in doing things?:no   Gynecologic History No LMP recorded. Patient is postmenopausal. Contraception: post menopausal status Last Pap: 2015. Results were: normal Last mammogram: 2018. Results were: normal  Obstetric History OB History  Gravida Para Term Preterm AB Living  5 1 1   4 1   SAB TAB Ectopic Multiple Live Births  4       1    # Outcome Date GA Lbr Len/2nd Weight Sex Delivery Anes PTL Lv  5 Term 12/25/69    F Vag-Spont   LIV  4 SAB           3 SAB           2 SAB           1 SAB               Past Medical History:  Diagnosis Date  . Anemia   . Arthritis   . Back pain   . Fibromyalgia   . Hypertension 11/04/2011  . Sleep apnea   . UTI (lower urinary tract infection)     Past Surgical History:  Procedure Laterality Date  . CHOLECYSTECTOMY    . OVARIAN CYST REMOVAL       Current Outpatient Prescriptions:  .  baclofen (LIORESAL) 10 MG tablet, Take 10 mg by mouth 3 (three) times daily., Disp: , Rfl:  .  Celecoxib (CELEBREX PO), Take  by mouth daily., Disp: , Rfl:  .  fluticasone (FLONASE) 50 MCG/ACT nasal spray, instill TWO SPRAYS into each nostril ONCE DAILY, Disp: , Rfl: 4 .  hydrochlorothiazide (HYDRODIURIL) 25 MG tablet, TAKE ONE TABLET BY MOUTH ONCE DAILY, Disp: 90 tablet, Rfl: 3 .  olopatadine (PATANOL) 0.1 % ophthalmic solution, Place 1 drop into both eyes 2 (two) times daily., Disp: , Rfl:  .  PROAIR HFA 108 (90 Base) MCG/ACT inhaler, INHALE TWO PUFFS BY MOUTH FOUR TIMES DAILY AS NEEDED, Disp: , Rfl: 4 .  tiZANidine (ZANAFLEX) 4 MG tablet, Take 1 tablet (4 mg total) by mouth at bedtime., Disp: 30 tablet, Rfl: 2 .  cetirizine (ZYRTEC) 10 MG tablet, Take 10 mg by mouth daily as needed., Disp: , Rfl: 4 .  Cholecalciferol (VITAMIN D-3 PO), Take by mouth., Disp: , Rfl:  .  meloxicam (MOBIC) 7.5 MG tablet, Take 7.5 mg by mouth 2 (two) times daily as needed. for pain, Disp: , Rfl: 1 .  pregabalin (LYRICA) 50 MG capsule, Take 50 mg by mouth 3 (three) times daily., Disp: , Rfl:  Allergies  Allergen Reactions  . Quinolones  Itching and Rash  . Sulfa Antibiotics Rash    Social History  Substance Use Topics  . Smoking status: Current Every Day Smoker    Packs/day: 0.30    Years: 45.00    Types: Cigarettes  . Smokeless tobacco: Former Systems developer    Types: Snuff, Chew  . Alcohol use 1.2 oz/week    1 Glasses of wine, 1 Cans of beer per week    Family History  Problem Relation Age of Onset  . Colon cancer Mother   . Diabetes type II Maternal Grandmother   . Diabetes type II Paternal Grandmother       Review of Systems  Constitutional: negative for fatigue and weight loss Respiratory: negative for cough and wheezing Cardiovascular: negative for chest pain, fatigue and palpitations Gastrointestinal: negative for abdominal pain and change in bowel habits Musculoskeletal:negative for myalgias Neurological: negative for gait problems and tremors Behavioral/Psych: negative for abusive relationship, depression Endocrine:  negative for temperature intolerance    Genitourinary:negative for abnormal menstrual periods, genital lesions, hot flashes, sexual problems and vaginal discharge Integument/breast: negative for breast lump, breast tenderness, nipple discharge and skin lesion(s)    Objective:       BP (!) 152/87   Pulse 69   Ht 5' 2.5" (1.588 m)   Wt 234 lb 12.8 oz (106.5 kg)   BMI 42.26 kg/m  General:   alert  Skin:   no rash or abnormalities  Lungs:   clear to auscultation bilaterally  Heart:   regular rate and rhythm, S1, S2 normal, no murmur, click, rub or gallop  Breasts:   normal without suspicious masses, skin or nipple changes or axillary nodes  Abdomen:  normal findings: no organomegaly, soft, non-tender and no hernia  Pelvis:  External genitalia: normal general appearance Urinary system: urethral meatus normal and bladder without fullness, nontender Vaginal: normal without tenderness, induration or masses Cervix: normal appearance Adnexa: normal bimanual exam Uterus: anteverted and non-tender, normal size   Lab Review Urine pregnancy test Labs reviewed yes Radiologic studies reviewed yes  50% of 20 min visit spent on counseling and coordination of care.    Assessment:     1. Encounter for routine gynecological examination with Papanicolaou smear of cervix Rx: - Cytology - PAP  2. Vaginal discharge Rx: - Cervicovaginal ancillary only  3. Pelvic pain in female Rx: - US PELVIC COMPLETE WITH TRANSVAGINAL; Future  4. Class 3 severe obesity due to excess calories without serious comorbidity with body mass index (BMI) of 40.0 to 44.9 in adult [facility]) - program that includes caloric reduction, exercise and behavioral modification recommended  5. Essential hypertension - managed by PCP   Plan:    Education reviewed: calcium supplements, depression evaluation, low fat, low cholesterol diet, safe sex/STD prevention, self breast exams and weight bearing exercise. Follow up in: 2  weeks.   Meds ordered this encounter  Medications  . baclofen (LIORESAL) 10 MG tablet    Sig: Take 10 mg by mouth 3 (three) times daily.  Marland Kitchen olopatadine (PATANOL) 0.1 % ophthalmic solution    Sig: Place 1 drop into both eyes 2 (two) times daily.   No orders of the defined types were placed in this encounter.

## 2017-08-06 NOTE — Progress Notes (Signed)
Patient is in the office for pap and reports right pelvic pain. Last pap 02-15-2014.

## 2017-08-07 LAB — CERVICOVAGINAL ANCILLARY ONLY
Bacterial vaginitis: NEGATIVE
Candida vaginitis: NEGATIVE
Chlamydia: NEGATIVE
Neisseria Gonorrhea: NEGATIVE
Trichomonas: NEGATIVE

## 2017-08-08 LAB — CYTOLOGY - PAP
Diagnosis: NEGATIVE
HPV: NOT DETECTED

## 2017-08-21 ENCOUNTER — Other Ambulatory Visit: Payer: Self-pay | Admitting: Rheumatology

## 2017-08-22 NOTE — Telephone Encounter (Signed)
Last Visit: 04/17/17  Next Visit: 10/21/17  Okay to refill per Dr. Estanislado Pandy

## 2017-09-08 ENCOUNTER — Ambulatory Visit (HOSPITAL_COMMUNITY)
Admission: RE | Admit: 2017-09-08 | Discharge: 2017-09-08 | Disposition: A | Discharge status: Home / Self Care | Payer: Medicaid Other | Source: Ambulatory Visit | Attending: Obstetrics | Admitting: Obstetrics

## 2017-09-08 ENCOUNTER — Other Ambulatory Visit: Payer: Self-pay | Admitting: Obstetrics

## 2017-09-08 DIAGNOSIS — Z780 Asymptomatic menopausal state: Secondary | ICD-10-CM | POA: Diagnosis not present

## 2017-09-08 DIAGNOSIS — R102 Pelvic and perineal pain: Secondary | ICD-10-CM | POA: Diagnosis not present

## 2017-09-10 ENCOUNTER — Encounter: Payer: Self-pay | Admitting: Obstetrics

## 2017-09-10 ENCOUNTER — Ambulatory Visit (INDEPENDENT_AMBULATORY_CARE_PROVIDER_SITE_OTHER): Payer: Medicaid Other | Admitting: Obstetrics

## 2017-09-10 ENCOUNTER — Other Ambulatory Visit: Payer: Self-pay

## 2017-09-10 VITALS — BP 142/88 | HR 64 | Ht 62.4 in | Wt 232.5 lb

## 2017-09-10 DIAGNOSIS — R9389 Abnormal findings on diagnostic imaging of other specified body structures: Secondary | ICD-10-CM

## 2017-09-10 DIAGNOSIS — R102 Pelvic and perineal pain: Secondary | ICD-10-CM | POA: Diagnosis not present

## 2017-09-10 NOTE — Progress Notes (Signed)
Presents for [place] results.

## 2017-09-10 NOTE — Patient Instructions (Addendum)
Endometrial Biopsy  Endometrial biopsy is a procedure in which a tissue sample is taken from inside the uterus. The sample is taken from the endometrium, which is the lining of the uterus. The tissue sample is then checked under a microscope to see if the tissue is normal or abnormal. This procedure helps to determine where you are in your menstrual cycle and how hormone levels are affecting the lining of the uterus. This procedure may also be used to evaluate uterine bleeding or to diagnose endometrial cancer, endometrial tuberculosis, polyps, or other inflammatory conditions.  Tell a health care provider about:   Any allergies you have.   All medicines you are taking, including vitamins, herbs, eye drops, creams, and over-the-counter medicines.   Any problems you or family members have had with anesthetic medicines.   Any blood disorders you have.   Any surgeries you have had.   Any medical conditions you have.   Whether you are pregnant or may be pregnant.  What are the risks?  Generally, this is a safe procedure. However, problems may occur, including:   Bleeding.   Pelvic infection.   Puncture of the wall of the uterus with the biopsy device (rare).    What happens before the procedure?   Keep a record of your menstrual cycles as told by your health care provider. You may need to schedule your procedure for a specific time in your cycle.   You may want to bring a sanitary pad to wear after the procedure.   Ask your health care provider about:  ? Changing or stopping your regular medicines. This is especially important if you are taking diabetes medicines or blood thinners.  ? Taking medicines such as aspirin and ibuprofen. These medicines can thin your blood. Do not take these medicines before your procedure if your health care provider instructs you not to.   Plan to have someone take you home from the hospital or clinic.  What happens during the procedure?   To lower your risk of  infection:  ? Your health care team will wash or sanitize their hands.   You will lie on an exam table with your feet and legs supported as in a pelvic exam.   Your health care provider will insert an instrument (speculum) into your vagina to see your cervix.   Your cervix will be cleansed with an antiseptic solution.   A medicine (local anesthetic) will be used to numb the cervix.   A forceps instrument (tenaculum) will be used to hold your cervix steady for the biopsy.   A thin, rod-like instrument (uterine sound) will be inserted through your cervix to determine the length of your uterus and the location where the biopsy sample will be removed.   A thin, flexible tube (catheter) will be inserted through your cervix and into the uterus. The catheter will be used to collect the biopsy sample from your endometrial tissue.   The catheter and speculum will then be removed, and the tissue sample will be sent to a lab for examination.  What happens after the procedure?   You will rest in a recovery area until you are ready to go home.   You may have mild cramping and a small amount of vaginal bleeding. This is normal.   It is up to you to get the results of your procedure. Ask your health care provider, or the department that is doing the procedure, when your results will be ready.  Summary     Endometrial biopsy is a procedure in which a tissue sample is taken from the endometrium, which is the lining of the uterus.   This procedure may help to diagnose menstrual cycle problems, abnormal bleeding, or other conditions affecting the endometrium.   Before the procedure, keep a record of your menstrual cycles as told by your health care provider.   The tissue sample that is removed will be checked under a microscope to see if it is normal or abnormal.  This information is not intended to replace advice given to you by your health care provider. Make sure you discuss any questions you have with your health care  provider.  Document Released: 01/24/2005 Document Revised: 10/09/2016 Document Reviewed: 10/09/2016  Elsevier Interactive Patient Education  2017 [employer]

## 2017-09-10 NOTE — Progress Notes (Signed)
Patient ID: Dawn Lucas, female   DOB: 24-Oct-1955, 61 y.o.   MRN: [id number]  No chief complaint on file.   HPI Dawn Lucas is a 61 y.o. female.  Pelvic pain.  Postmenopausal. HPI  Past Medical History:  Diagnosis Date  . Anemia   . Arthritis   . Back pain   . Fibromyalgia   . Hypertension 11/04/2011  . Sleep apnea   . UTI (lower urinary tract infection)     Past Surgical History:  Procedure Laterality Date  . CHOLECYSTECTOMY    . OVARIAN CYST REMOVAL      Family History  Problem Relation Age of Onset  . Colon cancer Mother   . Diabetes type II Maternal Grandmother   . Diabetes type II Paternal Grandmother     Social History Social History   Tobacco Use  . Smoking status: Current Every Day Smoker    Packs/day: 0.30    Years: 45.00    Pack years: 13.50    Types: Cigarettes  . Smokeless tobacco: Former Systems developer    Types: Snuff, Chew  Substance Use Topics  . Alcohol use: Yes    Alcohol/week: 1.2 oz    Types: 1 Glasses of wine, 1 Cans of beer per week  . Drug use: No    Allergies  Allergen Reactions  . Quinolones Itching and Rash  . Sulfa Antibiotics Rash    Current Outpatient Medications  Medication Sig Dispense Refill  . baclofen (LIORESAL) 10 MG tablet Take 10 mg by mouth 3 (three) times daily.    . baclofen (LIORESAL) 10 MG tablet Take 1 tablet (10 mg total) by mouth 2 (two) times daily. 30 tablet 2  . Celecoxib (CELEBREX PO) Take by mouth daily.    . cetirizine (ZYRTEC) 10 MG tablet Take 10 mg by mouth daily as needed.  4  . Cholecalciferol (VITAMIN D-3 PO) Take by mouth.    . fluticasone (FLONASE) 50 MCG/ACT nasal spray instill TWO SPRAYS into each nostril ONCE DAILY  4  . hydrochlorothiazide (HYDRODIURIL) 25 MG tablet TAKE ONE TABLET BY MOUTH ONCE DAILY 90 tablet 3  . meloxicam (MOBIC) 7.5 MG tablet Take 7.5 mg by mouth 2 (two) times daily as needed. for pain  1  . olopatadine (PATANOL) 0.1 % ophthalmic solution Place 1 drop into both eyes 2 (two)  times daily.    . pregabalin (LYRICA) 50 MG capsule Take 50 mg by mouth 3 (three) times daily.    Marland Kitchen PROAIR HFA 108 (90 Base) MCG/ACT inhaler INHALE TWO PUFFS BY MOUTH FOUR TIMES DAILY AS NEEDED  4  . tiZANidine (ZANAFLEX) 4 MG tablet Take 1 tablet (4 mg total) by mouth at bedtime. 30 tablet 2   No current facility-administered medications for this visit.     Review of Systems Review of Systems Constitutional: negative for fatigue and weight loss Respiratory: negative for cough and wheezing Cardiovascular: negative for chest pain, fatigue and palpitations Gastrointestinal: negative for abdominal pain and change in bowel habits Genitourinary:positive for pelvic pain Integument/breast: negative for nipple discharge Musculoskeletal:negative for myalgias Neurological: negative for gait problems and tremors Behavioral/Psych: negative for abusive relationship, depression Endocrine: negative for temperature intolerance      Blood pressure (!) 142/88, pulse 64, height 5' 2.4" (1.585 m), weight 232 lb 8 oz (105.5 kg).  Physical Exam Physical Exam :  Deferred  >50% of 10 min visit spent on counseling and coordination of care.   Data Reviewed Ultrasound:     US PELVIS (TRANSABDOMINAL  ONLY) (Accession [id number]) (Order [id number])  Imaging  Date: 09/08/2017 Department: [facility] Released By: Hilda Lias, RT Authorizing: Shelly Bombard, MD  Exam Information   Status Exam Begun  Exam Ended   Final [99] 09/08/2017 10:01 AM 09/08/2017 10:31 AM  PACS Images   Show images for US PELVIS (TRANSABDOMINAL ONLY)  Study Result   CLINICAL DATA:  Pelvic pain in a female, pelvic pain and pain with intercourse for months ; postmenopausal  EXAM: TRANSABDOMINAL ULTRASOUND OF PELVIS  TECHNIQUE: Transabdominal ultrasound examination of the pelvis was performed including evaluation of the uterus, ovaries, adnexal regions, and pelvic cul-de-sac. Patient  declined transvaginal imaging.  COMPARISON:  None  FINDINGS: Uterus  Measurements: 6.9 x 3.1 x 3.7 cm. Normal morphology without mass  Endometrium  Thickness: 12 mm thick, abnormal for a postmenopausal patient. Heterogeneous in echogenicity. No definite endometrial fluid.  Right ovary  Measurements: 3.5 x 1.7 x 2.1 cm. Normal morphology without mass  Left ovary  Not visualized on transabdominal imaging question obscured by bowel  Other findings:  No free pelvic fluid or adnexal masses  IMPRESSION: Nonvisualization of LEFT ovary.  Thickened endometrial complex 12 mm thick.  Endometrial thickness is considered abnormal for an asymptomatic post-menopausal female. Endometrial sampling should be considered to exclude carcinoma.   Electronically Signed   By: Lavonia Dana M.D.   On: 09/08/2017 12:37    Assessment     1. Pelvic pain in female - normal ultrasound except for thickened endometrium - suspect pain is not of gyn etiology  2. Thickened endometrium - has had no postmenopausal bleeding - sampling of endometrium recommended to R/O hyperplasia or CA - educational material dispensed on Endometrial Biopsy    Plan    F/U in 6 weeks for Endometrial Biopsy  No orders of the defined types were placed in this encounter.  No orders of the defined types were placed in this encounter.

## 2017-10-07 NOTE — Progress Notes (Signed)
Office Visit Note  Patient: Dawn Lucas             Date of Birth: July 24, 1956           MRN: [id number]             PCP: Nolene Ebbs, MD Referring: Nolene Ebbs, MD Visit Date: 10/21/2017 Occupation: @GUAROCC @    Subjective:  Lower back pain.   History of Present Illness: Dawn Lucas is a 62 y.o. female with history of fibromyalgia osteoarthritis. She states she's been having severe pain and discomfort in her lower back. She's having difficulty walking and doing routine activities. She denies any history of injury. She's also noticed increased swelling and stiffness in her hands. She continues to have discomfort in her knee joints and her feet. She has some generalized pain from fibromyalgia. She has chronic insomnia which persists.  Activities of Daily Living:  Patient reports morning stiffness for all day hours.   Patient Reports nocturnal pain.  Difficulty dressing/grooming: Reports Difficulty climbing stairs: Reports Difficulty getting out of chair: Reports Difficulty using hands for taps, buttons, cutlery, and/or writing: Reports   Review of Systems  Constitutional: Positive for fatigue. Negative for night sweats, weight gain, weight loss and weakness.  HENT: Negative for mouth sores, trouble swallowing, trouble swallowing, mouth dryness and nose dryness.   Eyes: Positive for dryness. Negative for pain, redness and visual disturbance.  Respiratory: Negative for cough, shortness of breath, apnea and difficulty breathing.   Cardiovascular: Negative.  Positive for hypertension. Negative for chest pain, palpitations, irregular heartbeat and swelling in legs/feet.  Gastrointestinal: Positive for constipation. Negative for blood in stool and diarrhea.  Endocrine: Negative.  Negative for increased urination.  Genitourinary: Positive for nocturia. Negative for vaginal dryness.  Musculoskeletal: Positive for arthralgias, joint pain, joint swelling, myalgias, morning  stiffness, muscle tenderness and myalgias. Negative for muscle weakness.  Skin: Negative.  Negative for color change, rash, hair loss, skin tightness, ulcers and sensitivity to sunlight.  Allergic/Immunologic: Negative for susceptible to infections.  Neurological: Negative for dizziness, headaches, memory loss and night sweats.  Hematological: Negative.  Negative for swollen glands.  Psychiatric/Behavioral: Positive for sleep disturbance. Negative for depressed mood. The patient is not nervous/anxious.        Uses CPAP    PMFS History:  Patient Active Problem List   Diagnosis Date Noted  . Prediabetes 04/28/2015  . Pes planus of both feet 03/28/2015  . Seborrheic keratoses 08/22/2014  . Headache(784.0) 05/17/2014  . Lower extremity edema 09/22/2013  . Vitamin D deficiency 09/22/2013  . Tendinitis of left rotator cuff 06/17/2013  . Right ankle pain 06/17/2013  . Colon cancer screening 06/17/2013  . Knee pain, bilateral 03/20/2012  . Hypertension 11/04/2011  . Generalized pain 10/15/2011  . Back pain 02/08/2011  . Obese 02/08/2011  . Tobacco abuse 02/08/2011  . Sleep disorder 02/08/2011    Past Medical History:  Diagnosis Date  . Anemia   . Arthritis   . Back pain   . Fibromyalgia   . Hypertension 11/04/2011  . Sleep apnea   . UTI (lower urinary tract infection)     Family History  Problem Relation Age of Onset  . Colon cancer Mother   . Diabetes type II Maternal Grandmother   . Diabetes type II Paternal Grandmother    Past Surgical History:  Procedure Laterality Date  . CHOLECYSTECTOMY    . OVARIAN CYST REMOVAL     Social History   Social History Narrative  .  Not on file     Objective: Vital Signs: BP 139/68   Pulse (!) 59   Resp 18   Ht 5' 3.5" (1.613 m)   Wt 240 lb (108.9 kg)   BMI 41.85 kg/m    Physical Exam  Constitutional: She is oriented to person, place, and time. She appears well-developed and well-nourished.  HENT:  Head: Normocephalic and  atraumatic.  Eyes: Conjunctivae and EOM are normal.  Neck: Normal range of motion.  Cardiovascular: Normal rate, regular rhythm, normal heart sounds and intact distal pulses.  Pulmonary/Chest: Effort normal and breath sounds normal.  Abdominal: Soft. Bowel sounds are normal.  Lymphadenopathy:    She has no cervical adenopathy.  Neurological: She is alert and oriented to person, place, and time.  Skin: Skin is warm and dry. Capillary refill takes less than 2 seconds.  Psychiatric: She has a normal mood and affect. Her behavior is normal.  Nursing note and vitals reviewed.    Musculoskeletal Exam: C-spine and thoracic spine good range of motion. She has discomfort with range of motion of her lumbar spine. Shoulder joints had painful range of motion. Elbow joints wrist joints MCPs PIPs DIPs with good range of motion with no synovitis. She has discomfort with range of motion of her right hip. She is osteoarthritis in her knee joints with some crepitus. She is some osteoarthritic changes in her feet with pes planus. Fibromyalgia tender points were 6 out of 18 positive.  CDAI Exam: No CDAI exam completed.    Investigation: No additional findings. CBC Latest Ref Rng & Units 10/17/2016 05/02/2015 08/17/2013  WBC 3.8 - 10.8 K/uL 8.7 9.6 9.2  Hemoglobin 11.7 - 15.5 g/dL 13.6 12.8 12.9  Hematocrit 35.0 - 45.0 % 41.3 38.4 37.6  Platelets 140 - 400 K/uL 335 357 400   CMP Latest Ref Rng & Units 10/17/2016 05/02/2015 08/17/2013  Glucose 65 - 99 mg/dL 114(H) 126(H) 105(H)  BUN 7 - 25 mg/dL 13 12 14   Creatinine 0.50 - 0.99 mg/dL 1.03(H) 0.97 1.04  Sodium 135 - 146 mmol/L 142 140 145  Potassium 3.5 - 5.3 mmol/L 4.6 3.8 3.8  Chloride 98 - 110 mmol/L 105 109 104  CO2 20 - 31 mmol/L 23 23 26   Calcium 8.6 - 10.4 mg/dL 9.9 9.2 9.6  Total Protein 6.1 - 8.1 g/dL 7.3 6.8 7.3  Total Bilirubin 0.2 - 1.2 mg/dL 0.7 0.6 0.5  Alkaline Phos 33 - 130 U/L 57 64 83  AST 10 - 35 U/L 14 15 14   ALT 6 - 29 U/L 14 16 28      Imaging: Xr Hip Unilat W Or W/o Pelvis 2-3 Views Right  Result Date: 10/21/2017 No hip joint narrowing was noted. No chondrocalcinosis was noted. Impression: Unremarkable x-ray of the hip joint.  Xr Lumbar Spine 2-3 Views  Result Date: 10/21/2017 No significant disc space narrowing was noted. Mild anterior spurring was noted. Mild facet joint arthropathy was noted. No SI joint changes were noted. Impression: Mild is spondylosis of the lumbar spine.   Speciality Comments: No specialty comments available.    Procedures:  No procedures performed Allergies: Quinolones and Sulfa antibiotics   Assessment / Plan:     Visit Diagnoses: Fibromyalgia -she continues to have some generalized pain and discomfort and positive tender points. The muscle relaxers are helpful. She is on  baclofen 10 mg by mouth twice a day and tizanidine 4 mg by mouth daily at bedtime  Chronic midline low back pain without sciatica -  Plan: XR Lumbar Spine 2-3 Views. The x-ray showed mild spondylosis . I offered physical therapy but patient declined. I've given her a handout on exercises. She states she usually gets worse when she exercises and will decide if she would want to the exercises. I've encouraged her to walk on regular basis.  Primary osteoarthritis of both hands: Joint protection and muscle strengthening discussed.  Pain in right hip - Plan: XR HIP UNILAT W OR W/O PELVIS 2-3 VIEWS RIGHT. The x-ray was unremarkable.  Primary osteoarthritis of both knees: Weight loss diet and exercise was discussed.  Primary osteoarthritis of both feet: Proper fitting shoes were discussed.  Pes planus of both feet  History of sleep apnea: She's chronic insomnia due to sleep apnea.  History of obesity: Weight loss diet and exercise was discussed.  History of vitamin D deficiency when she is on vitamin D supplement.  Smoker  History of hypertension  Prediabetes    Orders: Orders Placed This Encounter    Procedures  . XR Lumbar Spine 2-3 Views  . XR HIP UNILAT W OR W/O PELVIS 2-3 VIEWS RIGHT   No orders of the defined types were placed in this encounter.   Face-to-face time spent with patient was 30 minutes .Greater than 50% of time was spent in counseling and coordination of care.  Follow-Up Instructions: Return in about 6 months (around 04/20/2018) for Osteoarthritis, FMS.   Bo Merino, MD  Note - This record has been created using Editor, commissioning.  Chart creation errors have been sought, but may not always  have been located. Such creation errors do not reflect on  the standard of medical care.

## 2017-10-09 ENCOUNTER — Other Ambulatory Visit: Payer: Self-pay | Admitting: Internal Medicine

## 2017-10-09 DIAGNOSIS — Z1231 Encounter for screening mammogram for malignant neoplasm of breast: Secondary | ICD-10-CM

## 2017-10-21 ENCOUNTER — Ambulatory Visit (INDEPENDENT_AMBULATORY_CARE_PROVIDER_SITE_OTHER): Payer: Self-pay

## 2017-10-21 ENCOUNTER — Encounter: Payer: Self-pay | Admitting: Rheumatology

## 2017-10-21 ENCOUNTER — Ambulatory Visit: Payer: Medicaid Other | Admitting: Rheumatology

## 2017-10-21 VITALS — BP 139/68 | HR 59 | Resp 18 | Ht 63.5 in | Wt 240.0 lb

## 2017-10-21 DIAGNOSIS — M2141 Flat foot [pes planus] (acquired), right foot: Secondary | ICD-10-CM

## 2017-10-21 DIAGNOSIS — G8929 Other chronic pain: Secondary | ICD-10-CM

## 2017-10-21 DIAGNOSIS — Z8679 Personal history of other diseases of the circulatory system: Secondary | ICD-10-CM

## 2017-10-21 DIAGNOSIS — M19071 Primary osteoarthritis, right ankle and foot: Secondary | ICD-10-CM | POA: Diagnosis not present

## 2017-10-21 DIAGNOSIS — M19042 Primary osteoarthritis, left hand: Secondary | ICD-10-CM

## 2017-10-21 DIAGNOSIS — M19041 Primary osteoarthritis, right hand: Secondary | ICD-10-CM | POA: Diagnosis not present

## 2017-10-21 DIAGNOSIS — M170 Bilateral primary osteoarthritis of knee: Secondary | ICD-10-CM | POA: Diagnosis not present

## 2017-10-21 DIAGNOSIS — F17200 Nicotine dependence, unspecified, uncomplicated: Secondary | ICD-10-CM | POA: Diagnosis not present

## 2017-10-21 DIAGNOSIS — M797 Fibromyalgia: Secondary | ICD-10-CM | POA: Diagnosis not present

## 2017-10-21 DIAGNOSIS — Z8669 Personal history of other diseases of the nervous system and sense organs: Secondary | ICD-10-CM | POA: Diagnosis not present

## 2017-10-21 DIAGNOSIS — Z8639 Personal history of other endocrine, nutritional and metabolic disease: Secondary | ICD-10-CM

## 2017-10-21 DIAGNOSIS — M545 Low back pain: Secondary | ICD-10-CM

## 2017-10-21 DIAGNOSIS — M25551 Pain in right hip: Secondary | ICD-10-CM | POA: Diagnosis not present

## 2017-10-21 DIAGNOSIS — R7303 Prediabetes: Secondary | ICD-10-CM | POA: Diagnosis not present

## 2017-10-21 DIAGNOSIS — M2142 Flat foot [pes planus] (acquired), left foot: Secondary | ICD-10-CM

## 2017-10-21 DIAGNOSIS — M19072 Primary osteoarthritis, left ankle and foot: Secondary | ICD-10-CM

## 2017-10-21 NOTE — Patient Instructions (Signed)
Hip Exercises Ask your health care provider which exercises are safe for you. Do exercises exactly as told by your health care provider and adjust them as directed. It is normal to feel mild stretching, pulling, tightness, or discomfort as you do these exercises, but you should stop right away if you feel sudden pain or your pain gets worse.Do not begin these exercises until told by your health care provider. STRETCHING AND RANGE OF MOTION EXERCISES These exercises warm up your muscles and joints and improve the movement and flexibility of your hip. These exercises also help to relieve pain, numbness, and tingling. Exercise A: Hamstrings, Supine  1. Lie on your back. 2. Loop a belt or towel over the ball of your left / rightfoot. The ball of your foot is on the walking surface, right under your toes. 3. Straighten your left / rightknee and slowly pull on the belt to raise your leg. ? Do not let your left / right knee bend while you do this. ? Keep your other leg flat on the floor. ? Raise the left / right leg until you feel a gentle stretch behind your left / right knee or thigh. 4. Hold this position for __________ seconds. 5. Slowly return your leg to the starting position. Repeat __________ times. Complete this stretch __________ times a day. Exercise B: Hip Rotators  1. Lie on your back on a firm surface. 2. Hold your left / right knee with your left / right hand. Hold your ankle with your other hand. 3. Gently pull your left / right knee and rotate your lower leg toward your other shoulder. ? Pull until you feel a stretch in your buttocks. ? Keep your hips and shoulders firmly planted while you do this stretch. 4. Hold this position for __________ seconds. Repeat __________ times. Complete this stretch __________ times a day. Exercise C: V-Sit (Hamstrings and Adductors)  1. Sit on the floor with your legs extended in a large "V" shape. Keep your knees straight during this  exercise. 2. Start with your head and chest upright, then bend at your waist to reach for your left foot (position A). You should feel a stretch in your right inner thigh. 3. Hold this position for __________ seconds. Then slowly return to the upright position. 4. Bend at your waist to reach forward (position B). You should feel a stretch behind both of your thighs and knees. 5. Hold this position for __________ seconds. Then slowly return to the upright position. 6. Bend at your waist to reach for your right foot (position C). You should feel a stretch in your left inner thigh. 7. Hold this position for __________ seconds. Then slowly return to the upright position. Repeat __________ times. Complete this stretch __________ times a day. Exercise D: Lunge (Hip Flexors)  1. Place your left / right knee on the floor and bend your other knee so that is directly over your ankle. You should be half-kneeling. 2. Keep good posture with your head over your shoulders. 3. Tighten your buttocks to point your tailbone downward. This helps your back to keep from arching too much. 4. You should feel a gentle stretch in the front of your left / right thigh and hip. If you do not feel any resistance, slightly slide your other foot forward and then slowly lunge forward so your knee once again lines up over your ankle. 5. Make sure your tailbone continues to point downward. 6. Hold this position for __________ seconds. Repeat   __________ times. Complete this stretch __________ times a day. STRENGTHENING EXERCISES These exercises build strength and endurance in your hip. Endurance is the ability to use your muscles for a long time, even after they get tired. Exercise E: Bridge (Hip Extensors)  1. Lie on your back on a firm surface with your knees bent and your feet flat on the floor. 2. Tighten your buttocks muscles and lift your bottom off the floor until the trunk of your body is level with your thighs. ? Do not  arch your back. ? You should feel the muscles working in your buttocks and the back of your thighs. If you do not feel these muscles, slide your feet 1-2 inches (2.5-5 cm) farther away from your buttocks. 3. Hold this position for __________ seconds. 4. Slowly lower your hips to the starting position. 5. Let your muscles relax completely between repetitions. 6. If this exercise is too easy, try doing it with your arms crossed over your chest. Repeat __________ times. Complete this exercise __________ times a day. Exercise F: Straight Leg Raises - Hip Abductors  1. Lie on your side with your left / right leg in the top position. Lie so your head, shoulder, knee, and hip line up with each other. You may bend your bottom knee to help you balance. 2. Roll your hips slightly forward, so your hips are stacked directly over each other and your left / right knee is facing forward. 3. Leading with your heel, lift your top leg 4-6 inches (10-15 cm). You should feel the muscles in your outer hip lifting. ? Do not let your foot drift forward. ? Do not let your knee roll toward the ceiling. 4. Hold this position for __________ seconds. 5. Slowly return to the starting position. 6. Let your muscles relax completely between repetitions. Repeat __________ times. Complete this exercise __________ times a day. Exercise G: Straight Leg Raises - Hip Adductors  1. Lie on your side with your left / right leg in the bottom position. Lie so your head, shoulder, knee, and hip line up. You may place your upper foot in front to help you balance. 2. Roll your hips slightly forward, so your hips are stacked directly over each other and your left / right knee is facing forward. 3. Tense the muscles in your inner thigh and lift your bottom leg 4-6 inches (10-15 cm). 4. Hold this position for __________ seconds. 5. Slowly return to the starting position. 6. Let your muscles relax completely between repetitions. Repeat  __________ times. Complete this exercise __________ times a day. Exercise H: Straight Leg Raises - Quadriceps  1. Lie on your back with your left / right leg extended and your other knee bent. 2. Tense the muscles in the front of your left / right thigh. When you do this, you should see your kneecap slide up or see increased dimpling just above your knee. 3. Tighten these muscles even more and raise your leg 4-6 inches (10-15 cm) off the floor. 4. Hold this position for __________ seconds. 5. Keep these muscles tense as you lower your leg. 6. Relax the muscles slowly and completely between repetitions. Repeat __________ times. Complete this exercise __________ times a day. Exercise I: Hip Abductors, Standing 1. Tie one end of a rubber exercise band or tubing to a secure surface, such as a table or pole. 2. Loop the other end of the band or tubing around your left / right ankle. 3. Keeping your ankle with   the band or tubing directly opposite of the secured end, step away until there is tension in the tubing or band. Hold onto a chair as needed for balance. 4. Lift your left / right leg out to your side. While you do this: ? Keep your back upright. ? Keep your shoulders over your hips. ? Keep your toes pointing forward. ? Make sure to use your hip muscles to lift your leg. Do not "throw" your leg or tip your body to lift your leg. 5. Hold this position for __________ seconds. 6. Slowly return to the starting position. Repeat __________ times. Complete this exercise __________ times a day. Exercise J: Squats (Quadriceps) 1. Stand in a door frame so your feet and knees are in line with the frame. You may place your hands on the frame for balance. 2. Slowly bend your knees and lower your hips like you are going to sit in a chair. ? Keep your lower legs in a straight-up-and-down position. ? Do not let your hips go lower than your knees. ? Do not bend your knees lower than told by your health  care provider. ? If your hip pain increases, do not bend as low. 3. Hold this position for ___________ seconds. 4. Slowly push with your legs to return to standing. Do not use your hands to pull yourself to standing. Repeat __________ times. Complete this exercise __________ times a day. This information is not intended to replace advice given to you by your health care provider. Make sure you discuss any questions you have with your health care provider. Document Released: 10/11/2005 Document Revised: 06/17/2016 Document Reviewed: 09/18/2015 [employer] Interactive Patient Education  2018 [employer] Back Exercises The following exercises strengthen the muscles that help to support the back. They also help to keep the lower back flexible. Doing these exercises can help to prevent back pain or lessen existing pain. If you have back pain or discomfort, try doing these exercises 2-3 times each day or as told by your health care provider. When the pain goes away, do them once each day, but increase the number of times that you repeat the steps for each exercise (do more repetitions). If you do not have back pain or discomfort, do these exercises once each day or as told by your health care provider. Exercises Single Knee to Chest  Repeat these steps 3-5 times for each leg: 1. Lie on your back on a firm bed or the floor with your legs extended. 2. Bring one knee to your chest. Your other leg should stay extended and in contact with the floor. 3. Hold your knee in place by grabbing your knee or thigh. 4. Pull on your knee until you feel a gentle stretch in your lower back. 5. Hold the stretch for 10-30 seconds. 6. Slowly release and straighten your leg.  Pelvic Tilt  Repeat these steps 5-10 times: 1. Lie on your back on a firm bed or the floor with your legs extended. 2. Bend your knees so they are pointing toward the ceiling and your feet are flat on the floor. 3. Tighten your lower abdominal  muscles to press your lower back against the floor. This motion will tilt your pelvis so your tailbone points up toward the ceiling instead of pointing to your feet or the floor. 4. With gentle tension and even breathing, hold this position for 5-10 seconds.  Cat-Cow  Repeat these steps until your lower back becomes more flexible: 1. Get into a   hands-and-knees position on a firm surface. Keep your hands under your shoulders, and keep your knees under your hips. You may place padding under your knees for comfort. 2. Let your head hang down, and point your tailbone toward the floor so your lower back becomes rounded like the back of a cat. 3. Hold this position for 5 seconds. 4. Slowly lift your head and point your tailbone up toward the ceiling so your back forms a sagging arch like the back of a cow. 5. Hold this position for 5 seconds.  Press-Ups  Repeat these steps 5-10 times: 1. Lie on your abdomen (face-down) on the floor. 2. Place your palms near your head, about shoulder-width apart. 3. While you keep your back as relaxed as possible and keep your hips on the floor, slowly straighten your arms to raise the top half of your body and lift your shoulders. Do not use your back muscles to raise your upper torso. You may adjust the placement of your hands to make yourself more comfortable. 4. Hold this position for 5 seconds while you keep your back relaxed. 5. Slowly return to lying flat on the floor.  Bridges  Repeat these steps 10 times: 1. Lie on your back on a firm surface. 2. Bend your knees so they are pointing toward the ceiling and your feet are flat on the floor. 3. Tighten your buttocks muscles and lift your buttocks off of the floor until your waist is at almost the same height as your knees. You should feel the muscles working in your buttocks and the back of your thighs. If you do not feel these muscles, slide your feet 1-2 inches farther away from your buttocks. 4. Hold this  position for 3-5 seconds. 5. Slowly lower your hips to the starting position, and allow your buttocks muscles to relax completely.  If this exercise is too easy, try doing it with your arms crossed over your chest. Abdominal Crunches  Repeat these steps 5-10 times: 1. Lie on your back on a firm bed or the floor with your legs extended. 2. Bend your knees so they are pointing toward the ceiling and your feet are flat on the floor. 3. Cross your arms over your chest. 4. Tip your chin slightly toward your chest without bending your neck. 5. Tighten your abdominal muscles and slowly raise your trunk (torso) high enough to lift your shoulder blades a tiny bit off of the floor. Avoid raising your torso higher than that, because it can put too much stress on your low back and it does not help to strengthen your abdominal muscles. 6. Slowly return to your starting position.  Back Lifts Repeat these steps 5-10 times: 1. Lie on your abdomen (face-down) with your arms at your sides, and rest your forehead on the floor. 2. Tighten the muscles in your legs and your buttocks. 3. Slowly lift your chest off of the floor while you keep your hips pressed to the floor. Keep the back of your head in line with the curve in your back. Your eyes should be looking at the floor. 4. Hold this position for 3-5 seconds. 5. Slowly return to your starting position.  Contact a health care provider if:  Your back pain or discomfort gets much worse when you do an exercise.  Your back pain or discomfort does not lessen within 2 hours after you exercise. If you have any of these problems, stop doing these exercises right away. Do not do them   again unless your health care provider says that you can. Get help right away if:  You develop sudden, severe back pain. If this happens, stop doing the exercises right away. Do not do them again unless your health care provider says that you can. This information is not intended to  replace advice given to you by your health care provider. Make sure you discuss any questions you have with your health care provider. Document Released: 10/31/2004 Document Revised: 01/31/2016 Document Reviewed: 11/17/2014 Elsevier Interactive Patient Education  2017 [employer]  

## 2017-10-22 ENCOUNTER — Other Ambulatory Visit (HOSPITAL_COMMUNITY)
Admission: RE | Admit: 2017-10-22 | Discharge: 2017-10-22 | Disposition: A | Discharge status: Home / Self Care | Payer: Medicaid Other | Source: Ambulatory Visit | Attending: Obstetrics | Admitting: Obstetrics

## 2017-10-22 ENCOUNTER — Ambulatory Visit (INDEPENDENT_AMBULATORY_CARE_PROVIDER_SITE_OTHER): Payer: Medicaid Other | Admitting: Obstetrics

## 2017-10-22 ENCOUNTER — Encounter: Payer: Self-pay | Admitting: Obstetrics

## 2017-10-22 VITALS — BP 136/92 | HR 61 | Wt 240.9 lb

## 2017-10-22 DIAGNOSIS — Z3202 Encounter for pregnancy test, result negative: Secondary | ICD-10-CM | POA: Diagnosis not present

## 2017-10-22 DIAGNOSIS — N898 Other specified noninflammatory disorders of vagina: Secondary | ICD-10-CM | POA: Insufficient documentation

## 2017-10-22 DIAGNOSIS — R9389 Abnormal findings on diagnostic imaging of other specified body structures: Secondary | ICD-10-CM | POA: Diagnosis not present

## 2017-10-22 DIAGNOSIS — N882 Stricture and stenosis of cervix uteri: Secondary | ICD-10-CM | POA: Diagnosis not present

## 2017-10-22 LAB — POCT URINE PREGNANCY: Preg Test, Ur: NEGATIVE

## 2017-10-22 MED ORDER — MISOPROSTOL 200 MCG PO TABS: ORAL_TABLET | ORAL | 0 refills | Status: DC

## 2017-10-22 NOTE — Progress Notes (Signed)
Endometrial Biopsy Procedure Note  Pre-operative Diagnosis: Abnormal Endometrial Thickening on Ultrasound  Post-operative Diagnosis: same.  Cervical Stenosis  Indications: Endometrial Thickening  Procedure Details   Urine pregnancy test was done in office and result was negative.  The risks (including infection, bleeding, pain, and uterine perforation) and benefits of the procedure were explained to the patient and Written informed consent was obtained.    The patient was placed in the dorsal lithotomy position.  Bimanual exam showed the uterus to be in the neutral position.  A Graves' speculum inserted in the vagina, and the cervix prepped with povidone iodine.  Endocervical curettage with a Kevorkian curette was not performed.   A sharp tenaculum was applied to the anterior lip of the cervix for stabilization.  A sterile uterine sound was used to sound the uterus, but could not advance pass the internal os.  All instruments were then retired.  The procedure was aborted.  Condition: Stable  Complications: None  Plan:  The patient was advised to call for any fever or for prolonged or severe pain or bleeding. She was advised to use Ultram as needed for mild to moderate pain.   Attending Physician Documentation: I was present for or participated in the entire procedure, including opening and closing.   Shelly Bombard MD

## 2017-10-23 LAB — CERVICOVAGINAL ANCILLARY ONLY
Bacterial vaginitis: NEGATIVE
Candida vaginitis: NEGATIVE
Chlamydia: NEGATIVE
Neisseria Gonorrhea: NEGATIVE
Trichomonas: NEGATIVE

## 2017-11-05 ENCOUNTER — Ambulatory Visit: Payer: Medicaid Other | Admitting: Obstetrics

## 2017-11-05 ENCOUNTER — Encounter: Payer: Self-pay | Admitting: Obstetrics

## 2017-11-05 VITALS — BP 140/84 | Wt 236.0 lb

## 2017-11-05 DIAGNOSIS — N882 Stricture and stenosis of cervix uteri: Secondary | ICD-10-CM

## 2017-11-05 DIAGNOSIS — R9389 Abnormal findings on diagnostic imaging of other specified body structures: Secondary | ICD-10-CM

## 2017-11-05 DIAGNOSIS — Z780 Asymptomatic menopausal state: Secondary | ICD-10-CM

## 2017-11-05 MED ORDER — MEDROXYPROGESTERONE ACETATE 10 MG PO TABS: 10.0000 mg | ORAL_TABLET | Freq: Every day | ORAL | 0 refills | Status: DC

## 2017-11-05 NOTE — Progress Notes (Signed)
RGYN patient present for Endometrial Bx today. UPT: NEGATIVE

## 2017-11-05 NOTE — Progress Notes (Addendum)
Patient ID: Dawn Lucas, female   DOB: [birth year], 62 y.o.   MRN: [id number]  No chief complaint on file.   HPI Dawn Lucas is a 62 y.o. female.  Postmenopausal patient with history of pelvic pain that was sent for ultrasound which was normal except for a mildly thickened endometrial stripe.  The patient no history of postmenopausal bleeding.  Previous exam revealed stenosis of internal os of cervix.  The patient was brought in for an endometrial biopsy today after Cytotec cervical ripening. HPI  Past Medical History:  Diagnosis Date  . Anemia   . Arthritis   . Back pain   . Fibromyalgia   . Hypertension 11/04/2011  . Sleep apnea   . UTI (lower urinary tract infection)     Past Surgical History:  Procedure Laterality Date  . CHOLECYSTECTOMY    . OVARIAN CYST REMOVAL      Family History  Problem Relation Age of Onset  . Colon cancer Mother   . Diabetes type II Maternal Grandmother   . Diabetes type II Paternal Grandmother     Social History Social History   Tobacco Use  . Smoking status: Current Every Day Smoker    Packs/day: 0.30    Years: 45.00    Pack years: 13.50    Types: Cigarettes  . Smokeless tobacco: Former Systems developer    Types: Snuff, Chew  Substance Use Topics  . Alcohol use: Yes    Alcohol/week: 1.2 oz    Types: 1 Glasses of wine, 1 Cans of beer per week  . Drug use: No    Allergies  Allergen Reactions  . Quinolones Itching and Rash  . Sulfa Antibiotics Rash    Current Outpatient Medications  Medication Sig Dispense Refill  . baclofen (LIORESAL) 10 MG tablet Take 10 mg by mouth 3 (three) times daily.    . baclofen (LIORESAL) 10 MG tablet Take 1 tablet (10 mg total) by mouth 2 (two) times daily. 30 tablet 2  . Celecoxib (CELEBREX PO) Take by mouth daily.    . cetirizine (ZYRTEC) 10 MG tablet Take 10 mg by mouth daily as needed.  4  . Cholecalciferol (VITAMIN D-3 PO) Take by mouth.    . fluticasone (FLONASE) 50 MCG/ACT nasal spray instill TWO SPRAYS  into each nostril ONCE DAILY  4  . hydrochlorothiazide (HYDRODIURIL) 25 MG tablet TAKE ONE TABLET BY MOUTH ONCE DAILY 90 tablet 3  . meloxicam (MOBIC) 7.5 MG tablet Take 7.5 mg by mouth 2 (two) times daily as needed. for pain  1  . misoprostol (CYTOTEC) 200 MCG tablet Place 400 mcg ( 2 pills ) intravaginally 4 hours prior to Endometrial Biopsy. 2 tablet 0  . olopatadine (PATANOL) 0.1 % ophthalmic solution Place 1 drop into both eyes 2 (two) times daily.    . pregabalin (LYRICA) 50 MG capsule Take 50 mg by mouth 3 (three) times daily.    Marland Kitchen PROAIR HFA 108 (90 Base) MCG/ACT inhaler INHALE TWO PUFFS BY MOUTH FOUR TIMES DAILY AS NEEDED  4   No current facility-administered medications for this visit.     Review of Systems Review of Systems Constitutional: negative for fatigue and weight loss Respiratory: negative for cough and wheezing Cardiovascular: negative for chest pain, fatigue and palpitations Gastrointestinal: negative for abdominal pain and change in bowel habits Genitourinary:positive for pelvic pain Integument/breast: negative for nipple discharge Musculoskeletal:positive for myalgias - backache Neurological: negative for gait problems and tremors Behavioral/Psych: negative for abusive relationship, depression Endocrine: negative for temperature  intolerance      Blood pressure 140/84, weight 236 lb (107 kg).  Physical Exam Physical Exam           General:  Alert and no distress Abdomen:  normal findings: no organomegaly, soft, non-tender and no hernia  Pelvis:  External genitalia: normal general appearance Urinary system: urethral meatus normal and bladder without fullness, nontender Vaginal: normal without tenderness, induration or masses Cervix: normal appearance Adnexa: normal bimanual exam Uterus: anteverted and non-tender, normal size    50% of 15 min visit spent on counseling and coordination of care.   Data Reviewed Ultrasound  Assessment     1. Thickened  endometrium - Endometrial Biopsy unsuccessful due to persistent stenosis of internal os of cervix - discuss findings with patient, and decision made try po Cytotec cervical ripening prior to biopsy  2. Postmenopause     Plan    Will treat the endometrial hyperplasia with 30 days of Provera and repeat ultrasound in 6-8 weeks  No orders of the defined types were placed in this encounter.  No orders of the defined types were placed in this encounter.   Shelly Bombard MD

## 2017-11-06 MED ORDER — MISOPROSTOL 200 MCG PO TABS: ORAL_TABLET | ORAL | 0 refills | Status: DC

## 2017-11-06 NOTE — Addendum Note (Signed)
Addended by: Baltazar Najjar A on: 11/06/2017 10:24 AM   Modules accepted: Orders

## 2017-11-13 ENCOUNTER — Ambulatory Visit
Admission: RE | Admit: 2017-11-13 | Discharge: 2017-11-13 | Disposition: A | Discharge status: Home / Self Care | Payer: Medicaid Other | Source: Ambulatory Visit | Attending: Internal Medicine | Admitting: Internal Medicine

## 2017-11-13 DIAGNOSIS — Z1231 Encounter for screening mammogram for malignant neoplasm of breast: Secondary | ICD-10-CM

## 2017-12-30 ENCOUNTER — Encounter: Payer: Self-pay | Admitting: Obstetrics

## 2017-12-30 ENCOUNTER — Ambulatory Visit (INDEPENDENT_AMBULATORY_CARE_PROVIDER_SITE_OTHER): Payer: Medicaid Other | Admitting: Obstetrics

## 2017-12-30 VITALS — BP 176/104 | HR 70 | Wt 236.4 lb

## 2017-12-30 DIAGNOSIS — Z6841 Body Mass Index (BMI) 40.0 and over, adult: Secondary | ICD-10-CM

## 2017-12-30 DIAGNOSIS — R9389 Abnormal findings on diagnostic imaging of other specified body structures: Secondary | ICD-10-CM

## 2017-12-30 DIAGNOSIS — N882 Stricture and stenosis of cervix uteri: Secondary | ICD-10-CM

## 2017-12-30 DIAGNOSIS — F17200 Nicotine dependence, unspecified, uncomplicated: Secondary | ICD-10-CM

## 2017-12-30 DIAGNOSIS — Z780 Asymptomatic menopausal state: Secondary | ICD-10-CM

## 2017-12-30 DIAGNOSIS — I10 Essential (primary) hypertension: Secondary | ICD-10-CM | POA: Diagnosis not present

## 2017-12-30 NOTE — Progress Notes (Signed)
Endometrial Biopsy Procedure Note  Pre-operative Diagnosis: Abnormal Endometrial Thickening on Ultrasound                                              Cervical Stenosis  Post-operative Diagnosis: same                                                Attempted Endometrial Biopsy, Unsuccessful  Indications: Endometrial Thickening on Ultrasound  Premedication for Cervical Ripening:  Cytotec 400 micrograms given per buccal route ~ 6 hours prior to procedure  Procedure Details   Urine pregnancy test was not done.  The risks (including infection, bleeding, pain, and uterine perforation) and benefits of the procedure were explained to the patient and Written informed consent was obtained.    The patient was placed in the dorsal lithotomy position.  Bimanual exam showed the uterus to be in the neutral position.  A Graves' speculum inserted in the vagina, and the cervix prepped with povidone iodine.    A sharp tenaculum was applied to the anterior lip of the cervix for stabilization. Attempted to pass P[place] but again could not advance through stenotic internal os.  The procedure was terminated.  All instruments were retired.    Condition: Stable  Complications: None  Plan:  Explained to patient that we were unable to accomplish the endometrial biopsy in the office due to stenosis of the internal os of cervix, despite cervical ripening with Cytotec.  Outpatient D&C recommended, and patient agrees.  All questions answered.  Attending Physician Documentation: I was present for or participated in the entire procedure, including opening and closing.    Shelly Bombard MD 12-30-2017

## 2018-01-01 ENCOUNTER — Encounter (HOSPITAL_COMMUNITY): Payer: Self-pay

## 2018-01-01 ENCOUNTER — Other Ambulatory Visit: Payer: Self-pay | Admitting: Obstetrics and Gynecology

## 2018-01-01 ENCOUNTER — Other Ambulatory Visit: Payer: Self-pay | Admitting: Obstetrics

## 2018-01-01 MED ORDER — MISOPROSTOL 200 MCG PO TABS: ORAL_TABLET | ORAL | 1 refills | Status: DC

## 2018-01-07 ENCOUNTER — Encounter (HOSPITAL_BASED_OUTPATIENT_CLINIC_OR_DEPARTMENT_OTHER): Admission: RE | Payer: Self-pay | Source: Ambulatory Visit

## 2018-01-07 ENCOUNTER — Ambulatory Visit (HOSPITAL_BASED_OUTPATIENT_CLINIC_OR_DEPARTMENT_OTHER)
Admission: RE | Admit: 2018-01-07 | Payer: Medicaid Other | Source: Ambulatory Visit | Admitting: Obstetrics and Gynecology

## 2018-01-07 SURGERY — DILATATION AND CURETTAGE /HYSTEROSCOPY
Anesthesia: Choice

## 2018-01-12 ENCOUNTER — Ambulatory Visit: Payer: Medicaid Other | Admitting: Obstetrics and Gynecology

## 2018-01-12 ENCOUNTER — Encounter: Payer: Self-pay | Admitting: Obstetrics and Gynecology

## 2018-01-12 ENCOUNTER — Encounter (HOSPITAL_COMMUNITY): Payer: Self-pay

## 2018-01-12 VITALS — BP 147/93 | HR 85 | Wt 239.0 lb

## 2018-01-12 DIAGNOSIS — R9389 Abnormal findings on diagnostic imaging of other specified body structures: Secondary | ICD-10-CM

## 2018-01-12 MED ORDER — MISOPROSTOL 200 MCG PO TABS: ORAL_TABLET | ORAL | 1 refills | Status: DC

## 2018-01-12 NOTE — Progress Notes (Signed)
62 yo [id number] here for surgical consultation for D&C. Patient seen at the end of 2018 with complaints of a pelvic pain. Pelvic ultrasound revealed a thickened endometrium in an otherwise asymptomatic postmenopausal women. Patient had failed attempts at endometrial biopsy in the office due to cervical stenosis. Patient denies any episodes of vaginal bleeding  Past Medical History:  Diagnosis Date  . Anemia   . Arthritis   . Back pain   . Fibromyalgia   . Hypertension 11/04/2011  . Sleep apnea   . UTI (lower urinary tract infection)   ' Past Surgical History:  Procedure Laterality Date  . CHOLECYSTECTOMY    . OVARIAN CYST REMOVAL     Family History  Problem Relation Age of Onset  . Colon cancer Mother   . Diabetes type II Maternal Grandmother   . Diabetes type II Paternal Grandmother    Social History   Tobacco Use  . Smoking status: Current Every Day Smoker    Packs/day: 0.30    Years: 45.00    Pack years: 13.50    Types: Cigarettes  . Smokeless tobacco: Former Systems developer    Types: Snuff, Chew  Substance Use Topics  . Alcohol use: Yes    Alcohol/week: 1.2 oz    Types: 1 Glasses of wine, 1 Cans of beer per week  . Drug use: No   ROS See pertinent in HPI  Blood pressure (!) 147/93, pulse 85, weight 239 lb (108.4 kg). GENERAL: Well-developed, well-nourished female in no acute distress.  NEURO: alert and oriented x 3   09/2017 ultrasound FINDINGS: Uterus  Measurements: 6.9 x 3.1 x 3.7 cm. Normal morphology without mass  Endometrium  Thickness: 12 mm thick, abnormal for a postmenopausal patient. Heterogeneous in echogenicity. No definite endometrial fluid.  Right ovary  Measurements: 3.5 x 1.7 x 2.1 cm. Normal morphology without mass  Left ovary  Not visualized on transabdominal imaging question obscured by bowel  Other findings:  No free pelvic fluid or adnexal masses  IMPRESSION: Nonvisualization of LEFT ovary.  Thickened endometrial complex 12 mm  thick.  Endometrial thickness is considered abnormal for an asymptomatic post-menopausal female. Endometrial sampling should be considered to exclude carcinoma.   Electronically Signed   By: Lavonia Dana M.D.   On: 09/08/2017 12:37   A/P 62 yo postmenopausal with thickened endometrium in need of endometrial sampling - Discussed outpatient D&C with hysteroscopy. Risks, benefits and alternatives were explained including but not limited to risks of bleeding, infection, uterine perforation and damage to adjacent organs.  - Rx cytotec provided to take the night before the procedure - patient will be contacted with date and time of the surgery

## 2018-02-05 NOTE — Patient Instructions (Addendum)
Your procedure is scheduled on: Monday, May 20  Enter through the Main Entrance of [facility] at: 7:30 am  Pick up the phone at the desk and dial [phone].  Call this number if you have problems the morning of surgery: [phone].  Remember: Do NOT eat food or Do NOT drink clear liquids (including water) after midnight Sunday.  Take these medicines the morning of surgery with a SIP OF WATER: Lyrica.  Ok to use eye drops.   Bring Albuterol inhaler with you on day of surgery.  Do Not smoke on the day of surgery.  Stop herbal medications, vitamin supplements, NSAIDS and Ibuprofen one week prior to surgery (stop Wednesday 02/18/18).  Do NOT wear jewelry (body piercing), metal hair clips/bobby pins, make-up, or nail polish. Do NOT wear lotions, powders, or perfumes.  You may wear deoderant. Do NOT shave for 48 hours prior to surgery. Do NOT bring valuables to the hospital.  Have a responsible adult drive you home and stay with you for 24 hours after your procedure.  Home with Dawn Lucas cell [phone].

## 2018-02-13 ENCOUNTER — Encounter (HOSPITAL_COMMUNITY): Payer: Self-pay

## 2018-02-13 ENCOUNTER — Encounter (HOSPITAL_COMMUNITY)
Admission: RE | Admit: 2018-02-13 | Discharge: 2018-02-13 | Disposition: A | Discharge status: Home / Self Care | Payer: Medicaid Other | Source: Ambulatory Visit | Attending: Obstetrics and Gynecology | Admitting: Obstetrics and Gynecology

## 2018-02-13 ENCOUNTER — Other Ambulatory Visit: Payer: Self-pay

## 2018-02-13 DIAGNOSIS — Z01810 Encounter for preprocedural cardiovascular examination: Secondary | ICD-10-CM | POA: Diagnosis not present

## 2018-02-13 DIAGNOSIS — Z01812 Encounter for preprocedural laboratory examination: Secondary | ICD-10-CM | POA: Insufficient documentation

## 2018-02-13 HISTORY — DX: Headache: R51

## 2018-02-13 HISTORY — DX: Hyperlipidemia, unspecified: E78.5

## 2018-02-13 HISTORY — DX: Other specified personal risk factors, not elsewhere classified: Z91.89

## 2018-02-13 HISTORY — DX: Headache, unspecified: R51.9

## 2018-02-13 HISTORY — DX: Unspecified asthma, uncomplicated: J45.909

## 2018-02-13 LAB — TYPE AND SCREEN
ABO/RH(D): O POS
Antibody Screen: NEGATIVE

## 2018-02-13 LAB — BASIC METABOLIC PANEL
Anion gap: 12 (ref 5–15)
BUN: 13 mg/dL (ref 6–20)
CO2: 23 mmol/L (ref 22–32)
Calcium: 9.5 mg/dL (ref 8.9–10.3)
Chloride: 105 mmol/L (ref 101–111)
Creatinine, Ser: 0.98 mg/dL (ref 0.44–1.00)
GFR calc Af Amer: >60 mL/min (ref >60)
GFR calc non Af Amer: >60 mL/min (ref >60)
Glucose, Bld: 160 mg/dL — ABNORMAL HIGH (ref 65–99)
Potassium: 4 mmol/L (ref 3.5–5.1)
Sodium: 140 mmol/L (ref 135–145)

## 2018-02-13 LAB — CBC
HCT: 39.8 % (ref 36.0–46.0)
Hemoglobin: 13 g/dL (ref 12.0–15.0)
MCH: 29.3 pg (ref 26.0–34.0)
MCHC: 32.7 g/dL (ref 30.0–36.0)
MCV: 89.6 fL (ref 78.0–100.0)
Platelets: 310 10*3/uL (ref 150–400)
RBC: 4.44 MIL/uL (ref 3.87–5.11)
RDW: 13.8 % (ref 11.5–15.5)
WBC: 10.8 10*3/uL — ABNORMAL HIGH (ref 4.0–10.5)

## 2018-02-13 LAB — ABO/RH: ABO/RH(D): O POS

## 2018-02-23 ENCOUNTER — Encounter (HOSPITAL_COMMUNITY): Payer: Self-pay

## 2018-02-23 ENCOUNTER — Ambulatory Visit (HOSPITAL_COMMUNITY): Payer: Medicaid Other | Admitting: Anesthesiology

## 2018-02-23 ENCOUNTER — Other Ambulatory Visit: Payer: Self-pay

## 2018-02-23 ENCOUNTER — Encounter (HOSPITAL_COMMUNITY)
Admission: AD | Disposition: A | Discharge status: Home / Self Care | Payer: Self-pay | Source: Ambulatory Visit | Attending: Obstetrics and Gynecology

## 2018-02-23 ENCOUNTER — Ambulatory Visit (HOSPITAL_COMMUNITY)
Admission: AD | Admit: 2018-02-23 | Discharge: 2018-02-23 | Disposition: A | Discharge status: Home / Self Care | Payer: Medicaid Other | Source: Ambulatory Visit | Attending: Obstetrics and Gynecology | Admitting: Obstetrics and Gynecology

## 2018-02-23 DIAGNOSIS — N840 Polyp of corpus uteri: Secondary | ICD-10-CM | POA: Insufficient documentation

## 2018-02-23 DIAGNOSIS — Z882 Allergy status to sulfonamides status: Secondary | ICD-10-CM | POA: Insufficient documentation

## 2018-02-23 DIAGNOSIS — Z79899 Other long term (current) drug therapy: Secondary | ICD-10-CM | POA: Diagnosis not present

## 2018-02-23 DIAGNOSIS — E785 Hyperlipidemia, unspecified: Secondary | ICD-10-CM | POA: Insufficient documentation

## 2018-02-23 DIAGNOSIS — M170 Bilateral primary osteoarthritis of knee: Secondary | ICD-10-CM | POA: Insufficient documentation

## 2018-02-23 DIAGNOSIS — Z6841 Body Mass Index (BMI) 40.0 and over, adult: Secondary | ICD-10-CM | POA: Insufficient documentation

## 2018-02-23 DIAGNOSIS — Z888 Allergy status to other drugs, medicaments and biological substances status: Secondary | ICD-10-CM | POA: Insufficient documentation

## 2018-02-23 DIAGNOSIS — I10 Essential (primary) hypertension: Secondary | ICD-10-CM | POA: Diagnosis not present

## 2018-02-23 DIAGNOSIS — F17210 Nicotine dependence, cigarettes, uncomplicated: Secondary | ICD-10-CM | POA: Diagnosis not present

## 2018-02-23 DIAGNOSIS — M19042 Primary osteoarthritis, left hand: Secondary | ICD-10-CM | POA: Insufficient documentation

## 2018-02-23 DIAGNOSIS — J45909 Unspecified asthma, uncomplicated: Secondary | ICD-10-CM | POA: Insufficient documentation

## 2018-02-23 DIAGNOSIS — R9389 Abnormal findings on diagnostic imaging of other specified body structures: Secondary | ICD-10-CM | POA: Diagnosis not present

## 2018-02-23 DIAGNOSIS — M19041 Primary osteoarthritis, right hand: Secondary | ICD-10-CM | POA: Insufficient documentation

## 2018-02-23 DIAGNOSIS — G4730 Sleep apnea, unspecified: Secondary | ICD-10-CM | POA: Diagnosis not present

## 2018-02-23 DIAGNOSIS — Z9989 Dependence on other enabling machines and devices: Secondary | ICD-10-CM | POA: Insufficient documentation

## 2018-02-23 DIAGNOSIS — M797 Fibromyalgia: Secondary | ICD-10-CM | POA: Diagnosis not present

## 2018-02-23 HISTORY — PX: HYSTEROSCOPY WITH D & C: SHX1775

## 2018-02-23 SURGERY — DILATATION AND CURETTAGE /HYSTEROSCOPY
Anesthesia: General | Site: Vagina

## 2018-02-23 MED ORDER — PROPOFOL 10 MG/ML IV BOLUS
INTRAVENOUS | Status: AC
  Filled 2018-02-23: qty 20

## 2018-02-23 MED ORDER — GLYCOPYRROLATE 0.2 MG/ML IJ SOLN
INTRAMUSCULAR | Status: AC
  Filled 2018-02-23: qty 1

## 2018-02-23 MED ORDER — ONDANSETRON HCL 4 MG/2ML IJ SOLN
INTRAMUSCULAR | Status: DC | PRN
  Administered 2018-02-23: 4 mg via INTRAVENOUS

## 2018-02-23 MED ORDER — FENTANYL CITRATE (PF) 100 MCG/2ML IJ SOLN
INTRAMUSCULAR | Status: AC
  Filled 2018-02-23: qty 2

## 2018-02-23 MED ORDER — ONDANSETRON HCL 4 MG/2ML IJ SOLN
INTRAMUSCULAR | Status: AC
  Filled 2018-02-23: qty 2

## 2018-02-23 MED ORDER — KETOROLAC TROMETHAMINE 30 MG/ML IJ SOLN
INTRAMUSCULAR | Status: AC
  Filled 2018-02-23: qty 1

## 2018-02-23 MED ORDER — DEXAMETHASONE SODIUM PHOSPHATE 10 MG/ML IJ SOLN
INTRAMUSCULAR | Status: AC
  Filled 2018-02-23: qty 1

## 2018-02-23 MED ORDER — PROPOFOL 10 MG/ML IV BOLUS
INTRAVENOUS | Status: DC | PRN
  Administered 2018-02-23: 160 mg via INTRAVENOUS

## 2018-02-23 MED ORDER — LACTATED RINGERS IV SOLN
INTRAVENOUS | Status: DC
  Administered 2018-02-23: 125 mL/h via INTRAVENOUS

## 2018-02-23 MED ORDER — HYDROMORPHONE HCL 2 MG PO TABS: 2.0000 mg | ORAL_TABLET | ORAL | 0 refills | Status: DC | PRN

## 2018-02-23 MED ORDER — GLYCOPYRROLATE 0.2 MG/ML IJ SOLN
INTRAMUSCULAR | Status: DC | PRN
  Administered 2018-02-23: 0.2 mg via INTRAVENOUS

## 2018-02-23 MED ORDER — FENTANYL CITRATE (PF) 100 MCG/2ML IJ SOLN
INTRAMUSCULAR | Status: DC | PRN
  Administered 2018-02-23 (×4): 25 ug via INTRAVENOUS

## 2018-02-23 MED ORDER — PROMETHAZINE HCL 25 MG/ML IJ SOLN: 6.2500 mg | INTRAMUSCULAR | Status: DC | PRN

## 2018-02-23 MED ORDER — MIDAZOLAM HCL 2 MG/2ML IJ SOLN
INTRAMUSCULAR | Status: AC
  Filled 2018-02-23: qty 2

## 2018-02-23 MED ORDER — CHLOROPROCAINE HCL 1 % IJ SOLN
INTRAMUSCULAR | Status: DC | PRN
  Administered 2018-02-23: 20 mL

## 2018-02-23 MED ORDER — SODIUM CHLORIDE 0.9 % IR SOLN
Status: DC | PRN
  Administered 2018-02-23: 3000 mL

## 2018-02-23 MED ORDER — OXYCODONE HCL 5 MG/5ML PO SOLN: 5.0000 mg | Freq: Once | ORAL | Status: DC | PRN

## 2018-02-23 MED ORDER — OXYCODONE HCL 5 MG PO TABS: 5.0000 mg | ORAL_TABLET | Freq: Once | ORAL | Status: DC | PRN

## 2018-02-23 MED ORDER — LIDOCAINE HCL (CARDIAC) PF 100 MG/5ML IV SOSY
PREFILLED_SYRINGE | INTRAVENOUS | Status: AC
  Filled 2018-02-23: qty 5

## 2018-02-23 MED ORDER — CHLOROPROCAINE HCL 1 % IJ SOLN
INTRAMUSCULAR | Status: AC
  Filled 2018-02-23: qty 30

## 2018-02-23 MED ORDER — KETOROLAC TROMETHAMINE 30 MG/ML IJ SOLN
INTRAMUSCULAR | Status: DC | PRN
  Administered 2018-02-23: 30 mg via INTRAVENOUS

## 2018-02-23 MED ORDER — FENTANYL CITRATE (PF) 100 MCG/2ML IJ SOLN: 25.0000 ug | INTRAMUSCULAR | Status: DC | PRN

## 2018-02-23 MED ORDER — LIDOCAINE HCL (CARDIAC) PF 100 MG/5ML IV SOSY
PREFILLED_SYRINGE | INTRAVENOUS | Status: DC | PRN
  Administered 2018-02-23: 80 mg via INTRAVENOUS

## 2018-02-23 MED ORDER — DEXAMETHASONE SODIUM PHOSPHATE 10 MG/ML IJ SOLN
INTRAMUSCULAR | Status: DC | PRN
  Administered 2018-02-23: 10 mg via INTRAVENOUS

## 2018-02-23 SURGICAL SUPPLY — 12 items
CATH ROBINSON RED A/P 16FR (CATHETERS) ×2 IMPLANT
GLOVE BIOGEL PI IND STRL 6.5 (GLOVE) ×1 IMPLANT
GLOVE BIOGEL PI IND STRL 7.0 (GLOVE) ×1 IMPLANT
GLOVE BIOGEL PI INDICATOR 6.5 (GLOVE) ×1
GLOVE BIOGEL PI INDICATOR 7.0 (GLOVE) ×1
GLOVE SURG SS PI 6.0 STRL IVOR (GLOVE) ×2 IMPLANT
GOWN STRL REUS W/TWL LRG LVL3 (GOWN DISPOSABLE) ×4 IMPLANT
PACK VAGINAL MINOR WOMEN LF (CUSTOM PROCEDURE TRAY) ×2 IMPLANT
PAD OB MATERNITY 4.3X12.25 (PERSONAL CARE ITEMS) ×2 IMPLANT
TOWEL OR 17X24 6PK STRL BLUE (TOWEL DISPOSABLE) ×4 IMPLANT
TUBING AQUILEX INFLOW (TUBING) ×2 IMPLANT
TUBING AQUILEX OUTFLOW (TUBING) ×2 IMPLANT

## 2018-02-23 NOTE — Discharge Instructions (Signed)
Dilation and Curettage or Vacuum Curettage, Care After These instructions give you information about caring for yourself after your procedure. Your doctor may also give you more specific instructions. Call your doctor if you have any problems or questions after your procedure. Follow these instructions at home:  **You may begin ibuprofen after 3:45 pm today**  Activity  Do not drive or use heavy machinery while taking prescription pain medicine.  For 24 hours after your procedure, avoid driving.  Take short walks often, followed by rest periods. Ask your doctor what activities are safe for you. After one or two days, you may be able to return to your normal activities.  Do not lift anything that is heavier than 10 lb (4.5 kg) until your doctor approves.  For at least 2 weeks, or as long as told by your doctor: ? Do not douche. ? Do not use tampons. ? Do not have sex. General instructions  Take over-the-counter and prescription medicines only as told by your doctor. This is very important if you take blood thinning medicine.  Do not take baths, swim, or use a hot tub until your doctor approves. Take showers instead of baths.  Wear compression stockings as told by your doctor.  It is up to you to get the results of your procedure. Ask your doctor when your results will be ready.  Keep all follow-up visits as told by your doctor. This is important. Contact a doctor if:  You have very bad cramps that get worse or do not get better with medicine.  You have very bad pain in your belly (abdomen).  You cannot drink fluids without throwing up (vomiting).  You get pain in a different part of the area between your belly and thighs (pelvis).  You have bad-smelling discharge from your vagina.  You have a rash. Get help right away if:  You are bleeding a lot from your vagina. A lot of bleeding means soaking more than one sanitary pad in an hour, for 2 hours in a row.  You have  clumps of blood (blood clots) coming from your vagina.  You have a fever or chills.  Your belly feels very tender or hard.  You have chest pain.  You have trouble breathing.  You cough up blood.  You feel dizzy.  You feel light-headed.  You pass out (faint).  You have pain in your neck or shoulder area. Summary  Take short walks often, followed by rest periods. Ask your doctor what activities are safe for you. After one or two days, you may be able to return to your normal activities.  Do not lift anything that is heavier than 10 lb (4.5 kg) until your doctor approves.  Do not take baths, swim, or use a hot tub until your doctor approves. Take showers instead of baths.  Contact your doctor if you have any symptoms of infection, like bad-smelling discharge from your vagina. This information is not intended to replace advice given to you by your health care provider. Make sure you discuss any questions you have with your health care provider. Document Released: 07/02/2008 Document Revised: 06/10/2016 Document Reviewed: 06/10/2016 Elsevier Interactive Patient Education  2017 [employer].

## 2018-02-23 NOTE — Op Note (Signed)
PREOPERATIVE DIAGNOSIS:  thickened endometrium in postmenopausal setting POSTOPERATIVE DIAGNOSIS: The same PROCEDURE: Hysteroscopy, Dilation and Curettage. SURGEON:  Dr. Mora Bellman   INDICATIONS: 62 y.o. [id number]  here for scheduled surgery due to thickened endometrium in postmenopausal setting.   Risks of surgery were discussed with the patient including but not limited to: bleeding which may require transfusion; infection which may require antibiotics; injury to uterus or surrounding organs; intrauterine scarring; need for additional procedures including laparotomy or laparoscopy; and other postoperative/anesthesia complications. Written informed consent was obtained.    FINDINGS:  A 6-week size uterus.  Atrophic endometrium.  Normal ostia bilaterally.  ANESTHESIA:   General, paracervical block. INTRAVENOUS FLUIDS:  1000 ml of LR FLUID DEFICITS:  25 ccml of normal saline ESTIMATED BLOOD LOSS:  Less than 20 ml SPECIMENS: Endometrial curettings sent to pathology COMPLICATIONS:  None immediate.  PROCEDURE DETAILS:  The patient received intravenous antibiotics while in the preoperative area.  She was then taken to the operating room where general anesthesia was administered and was found to be adequate.  After an adequate timeout was performed, she was placed in the dorsal lithotomy position and examined; then prepped and draped in the sterile manner.   Her bladder was catheterized for an unmeasured amount of clear, yellow urine. A speculum was then placed in the patient's vagina and a single tooth tenaculum was applied to the anterior lip of the cervix.   A paracervical block using 20 ml of 0.5% Marcaine was administered.  The cervix was sounded to 6 cm and dilated manually with Hagar dilators to accommodate the 5 mm diagnostic hysteroscope.  Once the cervix was dilated, the hysteroscope was inserted under direct visualization using normal saline as a suspension medium.  The uterine cavity was  carefully examined, both ostia were recognized, and diffusely proliferative endometrium was noted.   After further careful visualization of the uterine cavity, the hysteroscope was removed under direct visualization.  A sharp curettage was then performed to obtain a moderate amount of endometrial curettings.  The tenaculum was removed from the anterior lip of the cervix and the vaginal speculum was removed after noting good hemostasis.  The patient tolerated the procedure well and was taken to the recovery area awake, extubated and in stable condition.

## 2018-02-23 NOTE — Anesthesia Preprocedure Evaluation (Addendum)
Anesthesia Evaluation  Patient identified by MRN, date of birth, ID band Patient awake    Reviewed: Allergy & Precautions, NPO status , Patient's Chart, lab work & pertinent test results  Airway Mallampati: II  TM Distance: >3 FB Neck ROM: Full    Dental  (+) Dental Advisory Given, Missing, Chipped, Loose, Poor Dentition,    Pulmonary asthma , sleep apnea and Continuous Positive Airway Pressure Ventilation , Current Smoker,    breath sounds clear to auscultation       Cardiovascular hypertension, Pt. on medications  Rhythm:Regular Rate:Normal     Neuro/Psych  Headaches,    GI/Hepatic negative GI ROS, Neg liver ROS,   Endo/Other  Morbid obesity  Renal/GU negative Renal ROS  negative genitourinary   Musculoskeletal  (+) Arthritis , Fibromyalgia -  Abdominal   Peds  Hematology negative hematology ROS (+)   Anesthesia Other Findings   Reproductive/Obstetrics                            Anesthesia Physical Anesthesia Plan  ASA: III  Anesthesia Plan: General   Post-op Pain Management:    Induction: Intravenous  PONV Risk Score and Plan: 3 and Treatment may vary due to age or medical condition, Ondansetron, Dexamethasone and Midazolam  Airway Management Planned: LMA  Additional Equipment: None  Intra-op Plan:   Post-operative Plan: Extubation in OR  Informed Consent: I have reviewed the patients History and Physical, chart, labs and discussed the procedure including the risks, benefits and alternatives for the proposed anesthesia with the patient or authorized representative who has indicated his/her understanding and acceptance.   Dental advisory given  Plan Discussed with: CRNA and Anesthesiologist  Anesthesia Plan Comments:         Anesthesia Quick Evaluation

## 2018-02-23 NOTE — Anesthesia Postprocedure Evaluation (Signed)
Anesthesia Post Note  Patient: Dawn Lucas  Procedure(s) Performed: DILATATION AND CURETTAGE /HYSTEROSCOPY (N/A Vagina )     Patient location during evaluation: PACU Anesthesia Type: General Level of consciousness: awake and alert Pain management: pain level controlled Vital Signs Assessment: post-procedure vital signs reviewed and stable Respiratory status: spontaneous breathing, nonlabored ventilation and respiratory function stable Cardiovascular status: blood pressure returned to baseline and stable Postop Assessment: no apparent nausea or vomiting Anesthetic complications: no    Last Vitals:  Vitals:   02/23/18 1100 02/23/18 1115  BP: 125/80 128/67  Pulse: 75 (!) 55  Resp: 16 17  Temp: 37.2 C   SpO2: 95% 92%    Last Pain:  Vitals:   02/23/18 1115  TempSrc:   PainSc: 0-No pain   Pain Goal: Patients Stated Pain Goal: 4 (02/23/18 0722)               Audry Pili

## 2018-02-23 NOTE — H&P (Signed)
Dawn Lucas is an 62 y.o. female postmenopausal here for dilatation and curretage. Patient presented with pelvic pain and noted to have a thickened endometrium on ultrasound. Patient denies any history of postmenopausal vaginal bleeding. Patient failed attempt at endometrial biopsy in the office. Patient is without any complaints. Patient placed cytotec vaginally last night.    Menstrual History: No LMP recorded. Patient is postmenopausal.    Past Medical History:  Diagnosis Date  . Anemia   . Arthritis    hands, knees  . Asthma   . Back pain   . Fibromyalgia   . Headache   . Hyperlipidemia   . Hypertension 11/04/2011  . Poor dental hygiene    missing and chipped teeth  . Sleep apnea    Uses CPAP nightly  . SVD (spontaneous vaginal delivery)    x 1  . UTI (lower urinary tract infection)     Past Surgical History:  Procedure Laterality Date  . CHOLECYSTECTOMY    . COLON SURGERY    . DILATION AND CURETTAGE OF UTERUS     MAB x 4  . OVARIAN CYST REMOVAL      Family History  Problem Relation Age of Onset  . Colon cancer Mother   . Diabetes type II Maternal Grandmother   . Diabetes type II Paternal Grandmother     Social History:  reports that she has been smoking cigarettes.  She has a 25.00 pack-year smoking history. She has quit using smokeless tobacco. Her smokeless tobacco use included snuff and chew. She reports that she drinks about 3.6 oz of alcohol per week. She reports that she does not use drugs.  Allergies:  Allergies  Allergen Reactions  . Quinolones Itching and Rash  . Sulfa Antibiotics Rash    Medications Prior to Admission  Medication Sig Dispense Refill Last Dose  . aspirin-acetaminophen-caffeine (EXCEDRIN MIGRAINE) 250-250-65 MG tablet Take 2 tablets by mouth every 6 (six) hours as needed for headache.   Past Month at Unknown time  . atorvastatin (LIPITOR) 20 MG tablet Take 20 mg by mouth at bedtime.   Past Week at Unknown time  . baclofen  (LIORESAL) 10 MG tablet Take 10 mg by mouth 2 (two) times daily as needed for muscle spasms.    Past Week at Unknown time  . celecoxib (CELEBREX) 100 MG capsule Take 100 mg by mouth 2 (two) times daily.    Past Week at Unknown time  . Cholecalciferol (VITAMIN D-3 PO) Take 1 capsule by mouth daily.    Past Week at Unknown time  . Cyanocobalamin (VITAMIN B-12 PO) Take 1 tablet by mouth daily.   Past Week at Unknown time  . hydrochlorothiazide (HYDRODIURIL) 25 MG tablet TAKE ONE TABLET BY MOUTH ONCE DAILY (Patient taking differently: Take 25 mg by mouth daily. ) 90 tablet 3 Past Week at Unknown time  . LYRICA 75 MG capsule Take 75 mg by mouth 3 (three) times daily.  5 Past Week at Unknown time  . misoprostol (CYTOTEC) 200 MCG tablet insert four tablets vaginally the night prior to your appointment (Patient taking differently: Take 800 mcg by mouth See admin instructions. Insert four tablets (800 mcg) vaginally the night prior to your appointment) 4 tablet 1 02/22/2018 at Unknown time  . naproxen (NAPROSYN) 500 MG tablet Take 500 mg by mouth 2 (two) times daily as needed for moderate pain.   Past Week at Unknown time  . olopatadine (PATANOL) 0.1 % ophthalmic solution Place 1 drop into both eyes  2 (two) times daily as needed for allergies.    Past Week at Unknown time  . tiZANidine (ZANAFLEX) 4 MG tablet Take 1 tablet (4 mg total) by mouth at bedtime.  2 Past Week at Unknown time  . traMADol (ULTRAM) 50 MG tablet TAKE 50 MG BY MOUTH EVERY SIX HOURS AS NEEDED FOR PAIN. max 20 tablets PER MONTH  2 Past Week at Unknown time  . albuterol (PROVENTIL HFA;VENTOLIN HFA) 108 (90 Base) MCG/ACT inhaler Inhale 2 puffs into the lungs every 6 (six) hours as needed for wheezing or shortness of breath.   More than a month at Unknown time    ROS See pertinent in HPI Blood pressure 135/61, pulse 65, temperature 99 F (37.2 C), temperature source Oral, resp. rate 16, SpO2 98 %. Physical Exam GENERAL: Well-developed,  well-nourished female in no acute distress.  LUNGS: Clear to auscultation bilaterally.  HEART: Regular rate and rhythm. ABDOMEN: Soft, nontender, nondistended. No organomegaly. PELVIC: Deferred to OR EXTREMITIES: No cyanosis, clubbing, or edema, 2+ distal pulses.  No results found for this or any previous visit (from the past 24 hour(s)).  No results found.   09/2017 FINDINGS: Uterus  Measurements: 6.9 x 3.1 x 3.7 cm. Normal morphology without mass  Endometrium  Thickness: 12 mm thick, abnormal for a postmenopausal patient. Heterogeneous in echogenicity. No definite endometrial fluid.  Right ovary  Measurements: 3.5 x 1.7 x 2.1 cm. Normal morphology without mass  Left ovary  Not visualized on transabdominal imaging question obscured by bowel  Other findings:  No free pelvic fluid or adnexal masses  IMPRESSION: Nonvisualization of LEFT ovary.  Thickened endometrial complex 12 mm thick.  Endometrial thickness is considered abnormal for an asymptomatic post-menopausal female. Endometrial sampling should be considered to exclude carcinoma.   Electronically Signed   By: Lavonia Dana M.D.   On: 09/08/2017 12:37  Assessment/Plan: 62 yo postmenopausal with thickened endometrium on ultrasound here for dilatation and currettage. Risks, benefits and alternatives were explained including but not limited to risks of bleeding, infection, uterine perforation, damage to adjacent organs. Patient verbalized understanding and all questions were answered.  Delano Scardino 02/23/2018, 8:19 AM

## 2018-02-23 NOTE — Anesthesia Procedure Notes (Signed)
Procedure Name: LMA Insertion Date/Time: 02/23/2018 9:23 AM Performed by: Audry Pili, MD Pre-anesthesia Checklist: Patient identified, Patient being monitored, Emergency Drugs available, Timeout performed and Suction available Patient Re-evaluated:Patient Re-evaluated prior to induction Oxygen Delivery Method: Circle System Utilized Preoxygenation: Pre-oxygenation with 100% oxygen Induction Type: IV induction Ventilation: Mask ventilation without difficulty LMA: LMA inserted LMA Size: 4.0 Number of attempts: 1 Placement Confirmation: positive ETCO2 and breath sounds checked- equal and bilateral

## 2018-02-23 NOTE — Transfer of Care (Signed)
Immediate Anesthesia Transfer of Care Note  Patient: Dawn Lucas  Procedure(s) Performed: DILATATION AND CURETTAGE /HYSTEROSCOPY (N/A Vagina )  Patient Location: PACU  Anesthesia Type:General  Level of Consciousness: awake, alert  and oriented  Airway & Oxygen Therapy: Patient Spontanous Breathing and Patient connected to face mask oxygen  Post-op Assessment: Report given to RN, Post -op Vital signs reviewed and stable and Patient moving all extremities X 4  Post vital signs: Reviewed and stable  Last Vitals:  Vitals Value Taken Time  BP 169/132 02/23/2018 10:03 AM  Temp    Pulse 97 02/23/2018 10:05 AM  Resp 27 02/23/2018 10:05 AM  SpO2 100 % 02/23/2018 10:05 AM  Vitals shown include unvalidated device data.  Last Pain:  Vitals:   02/23/18 0722  TempSrc: Oral  PainSc: 4       Patients Stated Pain Goal: 4 (64/68/03 2122)  Complications: No apparent anesthesia complications and Patient re-intubated

## 2018-02-24 ENCOUNTER — Encounter (HOSPITAL_COMMUNITY): Payer: Self-pay | Admitting: Obstetrics and Gynecology

## 2018-02-24 NOTE — Addendum Note (Signed)
Addendum  created 02/24/18 0719 by Lyda Jester, CRNA   Intraprocedure Event edited

## 2018-03-09 ENCOUNTER — Ambulatory Visit (INDEPENDENT_AMBULATORY_CARE_PROVIDER_SITE_OTHER): Payer: Medicaid Other | Admitting: Obstetrics and Gynecology

## 2018-03-09 ENCOUNTER — Encounter: Payer: Self-pay | Admitting: Obstetrics and Gynecology

## 2018-03-09 VITALS — BP 142/86 | HR 63 | Wt 237.0 lb

## 2018-03-09 DIAGNOSIS — Z9889 Other specified postprocedural states: Secondary | ICD-10-CM

## 2018-03-09 NOTE — Progress Notes (Signed)
62 yo here for post op check s/p D&C with hysteroscopy on 02/23/2018 for the evaluation of a thickened endometrium. Patient reports feeling well since the procedure. She denies any vaginal bleeding or pelvic pain. She reports improvement in her lower back and RLQ pain. She is without complaints  Past Medical History:  Diagnosis Date  . Anemia   . Arthritis    hands, knees  . Asthma   . Back pain   . Fibromyalgia   . Headache   . Hyperlipidemia   . Hypertension 11/04/2011  . Poor dental hygiene    missing and chipped teeth  . Sleep apnea    Uses CPAP nightly  . SVD (spontaneous vaginal delivery)    x 1  . UTI (lower urinary tract infection)    Past Surgical History:  Procedure Laterality Date  . CHOLECYSTECTOMY    . COLON SURGERY    . DILATION AND CURETTAGE OF UTERUS     MAB x 4  . HYSTEROSCOPY W/D&C N/A 02/23/2018   Procedure: DILATATION AND CURETTAGE /HYSTEROSCOPY;  Surgeon: Mora Bellman, MD;  Location: [facility];  Service: Gynecology;  Laterality: N/A;  . OVARIAN CYST REMOVAL     Family History  Problem Relation Age of Onset  . Colon cancer Mother   . Diabetes type II Maternal Grandmother   . Diabetes type II Paternal Grandmother    Social History   Tobacco Use  . Smoking status: Current Every Day Smoker    Packs/day: 0.50    Years: 50.00    Pack years: 25.00    Types: Cigarettes  . Smokeless tobacco: Former Systems developer    Types: Snuff, Chew  Substance Use Topics  . Alcohol use: Yes    Alcohol/week: 3.6 oz    Types: 4 Glasses of wine, 2 Cans of beer per week  . Drug use: No   ROS See pertinent in HPI Blood pressure (!) 142/86, pulse 63, weight 237 lb (107.5 kg).  GENERAL: Well-developed, well-nourished female in no acute distress.  HEENT: Normocephalic, atraumatic. Sclerae anicteric.  NECK: Supple. Normal thyroid.  LUNGS: Clear to auscultation bilaterally.  HEART: Regular rate and rhythm. BREASTS: Symmetric in size. No palpable masses or lymphadenopathy, skin  changes, or nipple drainage. ABDOMEN: Soft, nontender, nondistended. No organomegaly. PELVIC: Normal external female genitalia. Vagina is pink and rugated.  Normal discharge. Normal appearing cervix. Uterus is normal in size.  No adnexal mass or tenderness. EXTREMITIES: No cyanosis, clubbing, or edema, 2+ distal pulses.  Pathology Endometrium, curettage - ENDOMETRIOID-TYPE POLYP(S). - THERE IS NO EVIDENCE OF HYPERPLASIA OR MALIGNANCY.  A/P 62 yo here for post op check - Pathology results reviewed with the patient - Patient is medically cleared to resume all activities of daily living - RTC for routine gynecologic care or prn

## 2018-03-09 NOTE — Progress Notes (Signed)
Post Op FU. Having lower back and right pelvic pain 4/10

## 2018-04-10 NOTE — Progress Notes (Signed)
Office Visit Note  Patient: Dawn Lucas             Date of Birth: 30-Apr-1956           MRN: [id number]             PCP: Nolene Ebbs, MD Referring: Nolene Ebbs, MD Visit Date: 04/24/2018 Occupation: @GUAROCC @    Subjective:  Generalized pain   History of Present Illness: Otto Caraway is a 62 y.o. female with history of fibromyalgia and osteoarthritis.  She reports that she is in significant pain.  She has generalized muscle aches and muscle tenderness due to fibromyalgia. Fibromyalgia has been flaring lately.  She has tenderness in bilateral trapezius muscles.  She states her pain is most severe in her lower back.  She states she cannot have anyone touch her without being in significant pain.  She states that she has more muscle tension and joint stiffness.  She says she is also having symptoms of bilateral carpal tunnel.  She continues to take Zanaflex as needed for muscle spasms.  She states that she takes Zanaflex several days in a row she develops a migraine.  She states she is been having migraines 3-4 times per week.  She denies having a neurologist.  She states she also gets very nauseous and vomits when she has migraines.  She continues to take Dilaudid for pain relief as well as Celebrex daily.  She ranks her pain currently a 7 out of 10.  She states she is having severe muscle spasms in her lower back at this time.  She continues to have insomnia and fatigue.  She wakes up several times a night due to the pain level.   Activities of Daily Living:  Patient reports morning stiffness for several hours.   Patient Reports nocturnal pain.  Difficulty dressing/grooming: Reports Difficulty climbing stairs: Reports Difficulty getting out of chair: Reports Difficulty using hands for taps, buttons, cutlery, and/or writing: Reports   Review of Systems  Constitutional: Positive for fatigue.  HENT: Positive for mouth dryness. Negative for mouth sores and nose dryness.   Eyes:  Negative for pain, visual disturbance and dryness.  Respiratory: Negative for cough, hemoptysis, shortness of breath and difficulty breathing.   Cardiovascular: Negative for chest pain, palpitations, hypertension and swelling in legs/feet.  Gastrointestinal: Negative for blood in stool, constipation and diarrhea.  Endocrine: Negative for increased urination.  Genitourinary: Negative for painful urination and pelvic pain.  Musculoskeletal: Positive for arthralgias, joint pain, joint swelling and morning stiffness. Negative for myalgias, muscle weakness, muscle tenderness and myalgias.  Skin: Negative for color change, pallor, rash, hair loss, nodules/bumps, skin tightness, ulcers and sensitivity to sunlight.  Allergic/Immunologic: Negative for susceptible to infections.  Neurological: Positive for headaches (Hx of migraines). Negative for dizziness and numbness.  Hematological: Negative for swollen glands.  Psychiatric/Behavioral: Negative for depressed mood, confusion and sleep disturbance. The patient is not nervous/anxious.     PMFS History:  Patient Active Problem List   Diagnosis Date Noted  . Thickened endometrium   . Prediabetes 04/28/2015  . Pes planus of both feet 03/28/2015  . Seborrheic keratoses 08/22/2014  . Headache(784.0) 05/17/2014  . Lower extremity edema 09/22/2013  . Vitamin D deficiency 09/22/2013  . Tendinitis of left rotator cuff 06/17/2013  . Right ankle pain 06/17/2013  . Colon cancer screening 06/17/2013  . Knee pain, bilateral 03/20/2012  . Hypertension 11/04/2011  . Generalized pain 10/15/2011  . Back pain 02/08/2011  . Obese 02/08/2011  .  Tobacco abuse 02/08/2011  . Sleep disorder 02/08/2011    Past Medical History:  Diagnosis Date  . Anemia   . Arthritis    hands, knees  . Asthma   . Back pain   . Fibromyalgia   . Headache   . Hyperlipidemia   . Hypertension 11/04/2011  . Poor dental hygiene    missing and chipped teeth  . Sleep apnea     Uses CPAP nightly  . SVD (spontaneous vaginal delivery)    x 1  . UTI (lower urinary tract infection)     Family History  Problem Relation Age of Onset  . Colon cancer Mother   . Diabetes type II Maternal Grandmother   . Diabetes type II Paternal Grandmother    Past Surgical History:  Procedure Laterality Date  . CHOLECYSTECTOMY    . COLON SURGERY    . DILATION AND CURETTAGE OF UTERUS     MAB x 4  . HYSTEROSCOPY W/D&C N/A 02/23/2018   Procedure: DILATATION AND CURETTAGE /HYSTEROSCOPY;  Surgeon: Mora Bellman, MD;  Location: [facility];  Service: Gynecology;  Laterality: N/A;  . OVARIAN CYST REMOVAL     Social History   Social History Narrative  . Not on file     Objective: Vital Signs: BP 132/78 (BP Location: Left Arm, Patient Position: Sitting, Cuff Size: Large)   Pulse 82   Resp 16   Ht 5\' 2"  (1.575 m)   Wt 235 lb (106.6 kg)   BMI 42.98 kg/m    Physical Exam  Constitutional: She is oriented to person, place, and time. She appears well-developed and well-nourished.  HENT:  Head: Normocephalic and atraumatic.  Eyes: Conjunctivae and EOM are normal.  Neck: Normal range of motion.  Cardiovascular: Normal rate, regular rhythm, normal heart sounds and intact distal pulses.  Pulmonary/Chest: Effort normal and breath sounds normal.  Abdominal: Soft. Bowel sounds are normal.  Lymphadenopathy:    She has no cervical adenopathy.  Neurological: She is alert and oriented to person, place, and time.  Skin: Skin is warm and dry. Capillary refill takes less than 2 seconds.  Psychiatric: She has a normal mood and affect. Her behavior is normal.  Nursing note and vitals reviewed.    Musculoskeletal Exam: Generalized hyperalgesia on exam.  C-spine limited range of motion with discomfort.  Thoracic and lumbar spine limited range of motion.  Unable to assess midline spinal tenderness due to patient requesting not to touch along her spine.  She has severe muscle tenderness in the upper  extremities.  She has full range of motion bilateral shoulders with severe pain.  Elbow joints, wrist joints, MCPs, PIPs, DIPs good range of motion with no synovitis.  Unable to assess hip range of motion due to patient being in severe pain.  Knee joints full range of motion no warmth or effusion noted.  Ankle joints full range of motion with no synovitis.  CDAI Exam: No CDAI exam completed.    Investigation: No additional findings. CBC Latest Ref Rng & Units 02/13/2018 10/17/2016 05/02/2015  WBC 4.0 - 10.5 K/uL 10.8(H) 8.7 9.6  Hemoglobin 12.0 - 15.0 g/dL 13.0 13.6 12.8  Hematocrit 36.0 - 46.0 % 39.8 41.3 38.4  Platelets 150 - 400 K/uL 310 335 357   CMP Latest Ref Rng & Units 02/13/2018 10/17/2016 05/02/2015  Glucose 65 - 99 mg/dL 160(H) 114(H) 126(H)  BUN 6 - 20 mg/dL 13 13 12   Creatinine 0.44 - 1.00 mg/dL 0.98 1.03(H) 0.97  Sodium 135 -  145 mmol/L 140 142 140  Potassium 3.5 - 5.1 mmol/L 4.0 4.6 3.8  Chloride 101 - 111 mmol/L 105 105 109  CO2 22 - 32 mmol/L 23 23 23   Calcium 8.9 - 10.3 mg/dL 9.5 9.9 9.2  Total Protein 6.1 - 8.1 g/dL - 7.3 6.8  Total Bilirubin 0.2 - 1.2 mg/dL - 0.7 0.6  Alkaline Phos 33 - 130 U/L - 57 64  AST 10 - 35 U/L - 14 15  ALT 6 - 29 U/L - 14 16    Imaging: No results found.  Speciality Comments: No specialty comments available.    Procedures:  No procedures performed Allergies: Quinolones and Sulfa antibiotics   Assessment / Plan:     Visit Diagnoses: Fibromyalgia: She has generalized hyperalgesia on exam.  She has been having a fibromyalgia flare recently.  She has generalized muscle aches and muscle tenderness due to fibromyalgia.  It is difficult to fully examine her due to the level of pain she is in.  She rates her pain a 7 out of 10 currently.  She takes Dilaudid, tramadol, and Celebrex for pain relief.  We discussed a referral to physical therapy but she is unable to afford the co-pay as well as transportation being an issue.  She has been having  frequent migraines up to 2-3 times per week.  A referral was placed to neurology for evaluation.  She continues to have chronic insomnia and fatigue.  She is been having severe pain at night which has been causing interrupted sleep at night.  She is been waking up feeling unrested and having worsening fatigue.   Primary osteoarthritis of both hands: She has complete fist formation bilaterally.  No tenderness on exam.  No synovitis noted.  Joint protection and muscle strengthening were discussed.  Primary osteoarthritis of both knees: No warmth or effusion.  She is good range of motion on exam.  She has chronic pain in bilateral knee joints.  Primary osteoarthritis of both feet: Chronic pain.  She wears proper fitting shoes.  Chronic midline low back pain without sciatica: Chronic lower back pain and stiffness.  She has severe pain in her lower back as well as muscle spasms.  She takes Zanaflex 4 mg at bedtime as needed for muscle spasms.  She takes Zanaflex for more than 3 days and over the fourth day she develops a migraine.  Increased frequency of headaches: Patient reports she has been having 2-3 migraines per week.  She denies seeing a neurologist.  She states that she has severe nausea and vomiting when she has a migraine.  She has been taking Excedrin.  A referral was placed to neurology for evaluation of migraine headaches.  Other medical conditions are listed as follows:  Pes planus of both feet  Prediabetes  History of sleep apnea  Smoker  History of obesity  History of vitamin D deficiency  History of hypertension    Orders: Orders Placed This Encounter  Procedures  . Ambulatory referral to Neurology   No orders of the defined types were placed in this encounter.   Face-to-face time spent with patient was 30 minutes. Greater than 50% of time was spent in counseling and coordination of care.  Follow-Up Instructions: Return in about 1 year (around 04/25/2019) for  Fibromyalgia, Osteoarthritis.   Bo Merino, MD  Note - This record has been created using Editor, commissioning.  Chart creation errors have been sought, but may not always  have been located. Such creation errors do  not reflect on  the standard of medical care.

## 2018-04-24 ENCOUNTER — Encounter: Payer: Self-pay | Admitting: Rheumatology

## 2018-04-24 ENCOUNTER — Ambulatory Visit: Payer: Medicaid Other | Admitting: Rheumatology

## 2018-04-24 ENCOUNTER — Encounter: Payer: Self-pay | Admitting: Neurology

## 2018-04-24 VITALS — BP 132/78 | HR 82 | Resp 16 | Ht 62.0 in | Wt 235.0 lb

## 2018-04-24 DIAGNOSIS — M19072 Primary osteoarthritis, left ankle and foot: Secondary | ICD-10-CM

## 2018-04-24 DIAGNOSIS — M19071 Primary osteoarthritis, right ankle and foot: Secondary | ICD-10-CM

## 2018-04-24 DIAGNOSIS — M19042 Primary osteoarthritis, left hand: Secondary | ICD-10-CM

## 2018-04-24 DIAGNOSIS — M545 Low back pain, unspecified: Secondary | ICD-10-CM

## 2018-04-24 DIAGNOSIS — G8929 Other chronic pain: Secondary | ICD-10-CM

## 2018-04-24 DIAGNOSIS — R7303 Prediabetes: Secondary | ICD-10-CM | POA: Diagnosis not present

## 2018-04-24 DIAGNOSIS — M2142 Flat foot [pes planus] (acquired), left foot: Secondary | ICD-10-CM

## 2018-04-24 DIAGNOSIS — F17200 Nicotine dependence, unspecified, uncomplicated: Secondary | ICD-10-CM | POA: Diagnosis not present

## 2018-04-24 DIAGNOSIS — R51 Headache: Secondary | ICD-10-CM | POA: Diagnosis not present

## 2018-04-24 DIAGNOSIS — R519 Headache, unspecified: Secondary | ICD-10-CM

## 2018-04-24 DIAGNOSIS — M2141 Flat foot [pes planus] (acquired), right foot: Secondary | ICD-10-CM

## 2018-04-24 DIAGNOSIS — Z8679 Personal history of other diseases of the circulatory system: Secondary | ICD-10-CM | POA: Diagnosis not present

## 2018-04-24 DIAGNOSIS — Z8669 Personal history of other diseases of the nervous system and sense organs: Secondary | ICD-10-CM | POA: Diagnosis not present

## 2018-04-24 DIAGNOSIS — M19041 Primary osteoarthritis, right hand: Secondary | ICD-10-CM

## 2018-04-24 DIAGNOSIS — M797 Fibromyalgia: Secondary | ICD-10-CM | POA: Diagnosis not present

## 2018-04-24 DIAGNOSIS — Z8639 Personal history of other endocrine, nutritional and metabolic disease: Secondary | ICD-10-CM

## 2018-04-24 DIAGNOSIS — M170 Bilateral primary osteoarthritis of knee: Secondary | ICD-10-CM

## 2018-05-12 ENCOUNTER — Ambulatory Visit: Payer: Medicaid Other | Admitting: Neurology

## 2018-05-12 ENCOUNTER — Encounter: Payer: Self-pay | Admitting: Neurology

## 2018-05-12 VITALS — BP 122/78 | HR 61 | Ht 62.0 in | Wt 233.0 lb

## 2018-05-12 DIAGNOSIS — Z720 Tobacco use: Secondary | ICD-10-CM | POA: Diagnosis not present

## 2018-05-12 DIAGNOSIS — G43709 Chronic migraine without aura, not intractable, without status migrainosus: Secondary | ICD-10-CM | POA: Diagnosis not present

## 2018-05-12 MED ORDER — ONDANSETRON HCL 4 MG PO TABS: 4.0000 mg | ORAL_TABLET | Freq: Three times a day (TID) | ORAL | 3 refills | Status: DC | PRN

## 2018-05-12 MED ORDER — TOPIRAMATE 50 MG PO TABS
50.0000 mg | ORAL_TABLET | Freq: Every day | ORAL | 3 refills | Status: DC
Start: 2018-05-12 — End: 2018-08-17

## 2018-05-12 NOTE — Patient Instructions (Addendum)
Migraine Recommendations: 1.  Start topiramate 50mg  at bedtime.  Call in 4 weeks with update and we can adjust dose if needed. 2.  At the earliest onset of the neck pain, take tizanidine 4mg .  Take Excedrin at earliest onset of headache.  Zofran for nausea. 3.  Limit use of pain relievers to no more than 2 days out of the week.  These medications include acetaminophen, ibuprofen, triptans and narcotics.  This will help reduce risk of rebound headaches. 4.  Be aware of common food triggers such as processed sweets, processed foods with nitrites (such as deli meat, hot dogs, sausages), foods with MSG, alcohol (such as wine), chocolate, certain cheeses, certain fruits (dried fruits, bananas, some citrus fruit), vinegar, diet soda. 4.  Avoid caffeine 5.  Routine exercise 6.  Proper sleep hygiene 7.  Stay adequately hydrated with water 8.  Keep a headache diary. 9.  Maintain proper stress management. 10.  Do not skip meals. 11.  Consider supplements:  Magnesium citrate 400mg  to 600mg  daily, riboflavin 400mg , Coenzyme Q 10 100mg  three times daily 12.  Follow up in 3 to 4 months

## 2018-05-12 NOTE — Progress Notes (Signed)
NEUROLOGY CONSULTATION NOTE  Dawn Lucas MRN: [id number] DOB: 1956/09/06  Referring provider: Hazel Sams Primary care provider: Nolene Ebbs  Reason for consult:  migraines  HISTORY OF PRESENT ILLNESS: Dawn Lucas is a 62 year old right-handed female with osteoarthritis, fibromyalgia, hypertension, hyperlipidemia, OSA and migraines who presents for migraines.  History supplemented by referring provider's note.  Onset:  Early 1s.  Worse since around 2017. Location:  Starts in back of neck and radiates to the front/temples and face, often right sided Quality:  aching Intensity:  severe.  She denies new headache, thunderclap headache or severe headache that wakes her from sleep. Aura:  no Prodrome:  no Postdrome:  no Associated symptoms:  Nausea and vomiting.  No photophobia, phonophobia, osmophobia, visual disturbance or autonomic symptoms.  She denies associated unilateral numbness or weakness. Duration:  All day Frequency:  10 to 15 days a month Frequency of abortive medication: Excedrin 2 days a week but takes daily pain relievers for fibromyalgia Triggers:  no Exacerbating factors:  no Relieving factors:  no Activity:  aggravates  Current NSAIDS:  Celebrex Current analgesics:  Excedrin Migraine, hydromorphone Current triptans:  no Current ergotamine:  no Current anti-emetic:  no Current muscle relaxants:  tizanidine 4mg , baclofen 10mg  Current anti-anxiolytic:  no Current sleep aide:  no Current Antihypertensive medications:  HCTZ Current Antidepressant medications:  no Current Anticonvulsant medications:  Lyrica 75mg  three times daily Current anti-CGRP:  no Current Vitamins/Herbal/Supplements:  B12, D3 Current Antihistamines/Decongestants:  no Other therapy:  no  Past NSAIDS:  diclofenac 75mg , ibuprofen, Mobic, naproxen 500mg  Past analgesics:  Tylenol, tramadol 50mg  Past abortive triptans:  no Past abortive ergotamine:  no Past muscle relaxants:   Flexeril 10mg  Past anti-emetic:  no Past antihypertensive medications:  no Past antidepressant medications:  amitriptyline 75mg , Cymbalta 20mg , nortriptyline 25mg  Past anticonvulsant medications:  gabapentin Past anti-CGRP:  no Past vitamins/Herbal/Supplements:  no Past antihistamines/decongestants:  Flonase Other past therapies:  no  Caffeine:  Pepsi daily.  Coffee 2 to 4 days a week Alcohol:  occasional Smoker:  Yes. Diet:  Hydrates, soda Exercise:  no Depression:  no; Anxiety:  no Other pain:  fibromyalgia Sleep hygiene:  OSA with CPAP.  Uncomfortable Family history of headache:  no  02/13/18:  BMP with Na 140, K 4, glucxoe 160, BUN 13, Cr 0.98  PAST MEDICAL HISTORY: Past Medical History:  Diagnosis Date  . Anemia   . Arthritis    hands, knees  . Asthma   . Back pain   . Fibromyalgia   . Headache   . Hyperlipidemia   . Hypertension 11/04/2011  . Poor dental hygiene    missing and chipped teeth  . Sleep apnea    Uses CPAP nightly  . SVD (spontaneous vaginal delivery)    x 1  . UTI (lower urinary tract infection)     PAST SURGICAL HISTORY: Past Surgical History:  Procedure Laterality Date  . CHOLECYSTECTOMY    . COLON SURGERY    . DILATION AND CURETTAGE OF UTERUS     MAB x 4  . HYSTEROSCOPY W/D&C N/A 02/23/2018   Procedure: DILATATION AND CURETTAGE /HYSTEROSCOPY;  Surgeon: Mora Bellman, MD;  Location: [facility];  Service: Gynecology;  Laterality: N/A;  . OVARIAN CYST REMOVAL      MEDICATIONS: Current Outpatient Medications on File Prior to Visit  Medication Sig Dispense Refill  . albuterol (PROVENTIL HFA;VENTOLIN HFA) 108 (90 Base) MCG/ACT inhaler Inhale 2 puffs into the lungs every 6 (six) hours as needed for  wheezing or shortness of breath.    Marland Kitchen aspirin-acetaminophen-caffeine (EXCEDRIN MIGRAINE) 250-250-65 MG tablet Take 2 tablets by mouth every 6 (six) hours as needed for headache.    Marland Kitchen atorvastatin (LIPITOR) 20 MG tablet Take 20 mg by mouth at bedtime.      . baclofen (LIORESAL) 10 MG tablet Take 10 mg by mouth 2 (two) times daily as needed for muscle spasms.     . celecoxib (CELEBREX) 100 MG capsule Take 100 mg by mouth 2 (two) times daily.     . Cholecalciferol (VITAMIN D-3 PO) Take 1 capsule by mouth daily.     . Cyanocobalamin (VITAMIN B-12 PO) Take 1 tablet by mouth daily.    . hydrochlorothiazide (HYDRODIURIL) 25 MG tablet TAKE ONE TABLET BY MOUTH ONCE DAILY (Patient taking differently: Take 25 mg by mouth daily. ) 90 tablet 3  . HYDROmorphone (DILAUDID) 2 MG tablet Take 1 tablet (2 mg total) by mouth every 4 (four) hours as needed for severe pain. 10 tablet 0  . LYRICA 75 MG capsule Take 75 mg by mouth 3 (three) times daily.  5  . naproxen (NAPROSYN) 500 MG tablet Take 500 mg by mouth 2 (two) times daily as needed for moderate pain.    Marland Kitchen olopatadine (PATANOL) 0.1 % ophthalmic solution Place 1 drop into both eyes 2 (two) times daily as needed for allergies.     Marland Kitchen tiZANidine (ZANAFLEX) 4 MG tablet Take 1 tablet (4 mg total) by mouth at bedtime. as needed  2  . traMADol (ULTRAM) 50 MG tablet TAKE 50 MG BY MOUTH EVERY SIX HOURS AS NEEDED FOR PAIN. max 20 tablets PER MONTH  2   No current facility-administered medications on file prior to visit.     ALLERGIES: Allergies  Allergen Reactions  . Quinolones Itching and Rash  . Sulfa Antibiotics Rash    FAMILY HISTORY: Family History  Problem Relation Age of Onset  . Colon cancer Mother   . Diabetes type II Maternal Grandmother   . Diabetes type II Paternal Grandmother   . Heart attack Sister   . HIV Brother   . Cirrhosis Brother   . Diabetes Sister   . HIV Brother     SOCIAL HISTORY: Social History   Socioeconomic History  . Marital status: Single    Spouse name: Not on file  . Number of children: 1  . Years of education: Not on file  . Highest education level: 10th grade  Occupational History  . Occupation: disabled  Social Needs  . Financial resource strain: Not on file   . Food insecurity:    Worry: Not on file    Inability: Not on file  . Transportation needs:    Medical: Not on file    Non-medical: Not on file  Tobacco Use  . Smoking status: Current Every Day Smoker    Packs/day: 0.50    Years: 50.00    Pack years: 25.00    Types: Cigarettes  . Smokeless tobacco: Former Systems developer    Types: Snuff, Chew  Substance and Sexual Activity  . Alcohol use: Yes    Comment: rarely   . Drug use: No  . Sexual activity: Not Currently    Birth control/protection: Post-menopausal  Lifestyle  . Physical activity:    Days per week: Not on file    Minutes per session: Not on file  . Stress: Not on file  Relationships  . Social connections:    Talks on phone: Not on file  Gets together: Not on file    Attends religious service: Not on file    Active member of club or organization: Not on file    Attends meetings of clubs or organizations: Not on file    Relationship status: Not on file  . Intimate partner violence:    Fear of current or ex partner: Not on file    Emotionally abused: Not on file    Physically abused: Not on file    Forced sexual activity: Not on file  Other Topics Concern  . Not on file  Social History Narrative   Patient is right-handed. She lives alone.She drinks 3-4 cups of coffee a week, and green tea most days.    REVIEW OF SYSTEMS: Constitutional: No fevers, chills, or sweats, no generalized fatigue, change in appetite Eyes: No visual changes, double vision, eye pain Ear, nose and throat: No hearing loss, ear pain, nasal congestion, sore throat Cardiovascular: No chest pain, palpitations Respiratory:  No shortness of breath at rest or with exertion, wheezes GastrointestinaI: No nausea, vomiting, diarrhea, abdominal pain, fecal incontinence Genitourinary:  No dysuria, urinary retention or frequency Musculoskeletal:  Diffuse pain Integumentary: No rash, pruritus, skin lesions Neurological: as above Psychiatric: No depression,  insomnia, anxiety Endocrine: No palpitations, fatigue, diaphoresis, mood swings, change in appetite, change in weight, increased thirst Hematologic/Lymphatic:  No purpura, petechiae. Allergic/Immunologic: no itchy/runny eyes, nasal congestion, recent allergic reactions, rashes  PHYSICAL EXAM: Vitals:   05/12/18 0905  BP: 122/78  Pulse: 61  SpO2: 98%   General: No acute distress.  Head:  Normocephalic/atraumatic Eyes:  fundi examined but not visualized Neck: supple, paraspinal tenderness, full range of motion Back: paraspinal tenderness Heart: regular rate and rhythm Lungs: Clear to auscultation bilaterally. Vascular: No carotid bruits. Neurological Exam: Mental status: alert and oriented to person, place, and time, recent and remote memory intact, fund of knowledge intact, attention and concentration intact, speech fluent and not dysarthric, language intact. Cranial nerves: CN I: not tested CN II: pupils equal, round and reactive to light, visual fields intact CN III, IV, VI:  full range of motion, no nystagmus, no ptosis CN V: facial sensation intact CN VII: upper and lower face symmetric CN VIII: hearing intact CN IX, X: gag intact, uvula midline CN XI: sternocleidomastoid and trapezius muscles intact CN XII: tongue midline Bulk & Tone: normal, no fasciculations. Motor:  5/5 throughout  Sensation: temperature and vibration sensation intact. Deep Tendon Reflexes:  2+ throughout, toes downgoing.  Finger to nose testing:  Without dysmetria.  Heel to shin:  Without dysmetria.  Gait:  Antalgic gait.  Romberg negative.  IMPRESSION: Chronic migraine without aura, without status migrainosus, not intractable Tobacco use disorder  PLAN: 1.  Start topiramate 50mg  at bedtime.  Call in 4 weeks with update and we can adjust dose if needed. 2.  At the earliest onset of the neck pain, take tizanidine 4mg .  Take Excedrin at earliest onset of headache.  Ondansetron 4mg  for nausea. 3.   Limit use of pain relievers to no more than 2 days out of the week.  These medications include acetaminophen, ibuprofen, triptans and narcotics.  This will help reduce risk of rebound headaches. 4.  Be aware of common food triggers such as processed sweets, processed foods with nitrites (such as deli meat, hot dogs, sausages), foods with MSG, alcohol (such as wine), chocolate, certain cheeses, certain fruits (dried fruits, bananas, some citrus fruit), vinegar, diet soda. 4.  Avoid caffeine 5.  Routine exercise 6.  Proper sleep hygiene 7.  Stay adequately hydrated with water 8.  Keep a headache diary. 9.  Maintain proper stress management. 10.  Do not skip meals. 11.  Consider supplements:  Magnesium citrate 400mg  to 600mg  daily, riboflavin 400mg , Coenzyme Q 10 100mg  three times daily 12.  Tobacco cessation counseling (CPT 99406):  Tobacco with hypertension and hyperlipidemia (CAD risk factors) but no, stroke, or cancer  - Currently smoking 0.5 packs/day   - Patient was informed of the dangers of tobacco abuse including stroke, cancer, and MI, as well as benefits of tobacco cessation. - Patient is not willing to quit at this time. - Approximately 5 mins were spent counseling patient cessation techniques. We discussed various methods to help quit smoking, including deciding on a date to quit, joining a support group, pharmacological agents- nicotine gum/patch/lozenges, chantix.  - I will reassess her progress at the next follow-up visit 13.  Follow up in 3 to 4 months  Thank you for allowing me to take part in the care of this patient.  Metta Clines, DO  CC:  Eleonore Chiquito

## 2018-08-11 NOTE — Progress Notes (Signed)
NEUROLOGY FOLLOW UP OFFICE NOTE  Dawn Lucas [id number]  HISTORY OF PRESENT ILLNESS: Dawn Lucas is a 62 year old right-handed female with osteoarthritis, fibromyalgia, hypertension, hyperlipidemia, OSA and migraines who follows up for migraines.  UPDATE: No headaches since starting topiramate.  Maybe a very mild headache once in awhile.  Tizanidine was ineffective and seemed to increase headaches. Intensity:  Not applicable Duration:  Not applicable Frequency:  No headaches since last visit. Current NSAIDS: Celebrex Current analgesics: Excedrin (not needed), hydromorphone Current triptans: None Current ergotamine: None Current anti-emetic: Zofran 4 mg (not needed) Current muscle relaxants: none Current anti-anxiolytic: None Current sleep aide: None Current Antihypertensive medications: Hydrochlorothiazide Current Antidepressant medications: None Current Anticonvulsant medications: Topiramate 50 mg at bedtime, Lyrica 75 mg 3 times daily Current anti-CGRP: None Current Vitamins/Herbal/Supplements: B12, D3 Current Antihistamines/Decongestants: None Other therapy: No  Caffeine: Pepsi daily.  Coffee 2 to 4 days a week.  Diet: Hydrates Exercise: No Depression: No; Anxiety: No Other pain: Arthritis, fibromyalgia Sleep hygiene: OSA with CPAP.  Reports CPAP is uncomfortable.  HISTORY:  Onset: Early 63s.  Worse since around 2017. Location:  Starts in back of neck and radiates to the front/temples and face, often right sided Quality:  aching Initial intensity:  severe.  She denies new headache, thunderclap headache or severe headache that wakes her from sleep. Aura:  no Prodrome:  no Postdrome:  no Associated symptoms: Nausea and vomiting.  No photophobia, phonophobia, osmophobia, visual disturbance or autonomic symptoms.  She denies associated unilateral numbness or weakness. Initial duration:  All day Initial Frequency:  10 to 15 days a month Initial Frequency of  abortive medication: Excedrin 2 days a week but takes daily pain relievers for fibromyalgia Triggers: No Relieving factors:  no Activity:  aggravates  Past NSAIDS:  diclofenac 75mg , ibuprofen, Mobic, naproxen 500mg  Past analgesics:  Tylenol, tramadol 50mg  Past abortive triptans:  no Past abortive ergotamine:  no Past muscle relaxants:  Flexeril 10mg  Past anti-emetic:  no Past antihypertensive medications:  no Past antidepressant medications:  amitriptyline 75mg , Cymbalta 20mg , nortriptyline 25mg  Past anticonvulsant medications:  gabapentin Past anti-CGRP:  no Past vitamins/Herbal/Supplements:  no Past antihistamines/decongestants:  Flonase Other past therapies:  no  Family history of headache:  no  PAST MEDICAL HISTORY: Past Medical History:  Diagnosis Date  . Anemia   . Arthritis    hands, knees  . Asthma   . Back pain   . Fibromyalgia   . Headache   . Hyperlipidemia   . Hypertension 11/04/2011  . Poor dental hygiene    missing and chipped teeth  . Sleep apnea    Uses CPAP nightly  . SVD (spontaneous vaginal delivery)    x 1  . UTI (lower urinary tract infection)     MEDICATIONS: Current Outpatient Medications on File Prior to Visit  Medication Sig Dispense Refill  . albuterol (PROVENTIL HFA;VENTOLIN HFA) 108 (90 Base) MCG/ACT inhaler Inhale 2 puffs into the lungs every 6 (six) hours as needed for wheezing or shortness of breath.    Marland Kitchen aspirin-acetaminophen-caffeine (EXCEDRIN MIGRAINE) 250-250-65 MG tablet Take 2 tablets by mouth every 6 (six) hours as needed for headache.    Marland Kitchen atorvastatin (LIPITOR) 20 MG tablet Take 20 mg by mouth at bedtime.    . baclofen (LIORESAL) 10 MG tablet Take 10 mg by mouth 2 (two) times daily as needed for muscle spasms.     . celecoxib (CELEBREX) 100 MG capsule Take 100 mg by mouth 2 (two) times daily.     Marland Kitchen  Cholecalciferol (VITAMIN D-3 PO) Take 1 capsule by mouth daily.     . Cyanocobalamin (VITAMIN B-12 PO) Take 1 tablet by mouth  daily.    . hydrochlorothiazide (HYDRODIURIL) 25 MG tablet TAKE ONE TABLET BY MOUTH ONCE DAILY (Patient taking differently: Take 25 mg by mouth daily. ) 90 tablet 3  . HYDROmorphone (DILAUDID) 2 MG tablet Take 1 tablet (2 mg total) by mouth every 4 (four) hours as needed for severe pain. 10 tablet 0  . LYRICA 75 MG capsule Take 75 mg by mouth 3 (three) times daily.  5  . naproxen (NAPROSYN) 500 MG tablet Take 500 mg by mouth 2 (two) times daily as needed for moderate pain.    Marland Kitchen olopatadine (PATANOL) 0.1 % ophthalmic solution Place 1 drop into both eyes 2 (two) times daily as needed for allergies.     Marland Kitchen ondansetron (ZOFRAN) 4 MG tablet Take 1 tablet (4 mg total) by mouth every 8 (eight) hours as needed for nausea or vomiting. 20 tablet 3  . tiZANidine (ZANAFLEX) 4 MG tablet Take 1 tablet (4 mg total) by mouth at bedtime. as needed  2  . topiramate (TOPAMAX) 50 MG tablet Take 1 tablet (50 mg total) by mouth at bedtime. 30 tablet 3  . traMADol (ULTRAM) 50 MG tablet TAKE 50 MG BY MOUTH EVERY SIX HOURS AS NEEDED FOR PAIN. max 20 tablets PER MONTH  2   No current facility-administered medications on file prior to visit.     ALLERGIES: Allergies  Allergen Reactions  . Quinolones Itching and Rash  . Sulfa Antibiotics Rash    FAMILY HISTORY: Family History  Problem Relation Age of Onset  . Colon cancer Mother   . Diabetes type II Maternal Grandmother   . Diabetes type II Paternal Grandmother   . Heart attack Sister   . HIV Brother   . Cirrhosis Brother   . Diabetes Sister   . HIV Brother    SOCIAL HISTORY: Social History   Socioeconomic History  . Marital status: Single    Spouse name: Not on file  . Number of children: 1  . Years of education: Not on file  . Highest education level: 10th grade  Occupational History  . Occupation: disabled  Social Needs  . Financial resource strain: Not on file  . Food insecurity:    Worry: Not on file    Inability: Not on file  .  Transportation needs:    Medical: Not on file    Non-medical: Not on file  Tobacco Use  . Smoking status: Current Every Day Smoker    Packs/day: 0.50    Years: 50.00    Pack years: 25.00    Types: Cigarettes  . Smokeless tobacco: Former Systems developer    Types: Snuff, Chew  Substance and Sexual Activity  . Alcohol use: Yes    Comment: rarely   . Drug use: No  . Sexual activity: Not Currently    Birth control/protection: Post-menopausal  Lifestyle  . Physical activity:    Days per week: Not on file    Minutes per session: Not on file  . Stress: Not on file  Relationships  . Social connections:    Talks on phone: Not on file    Gets together: Not on file    Attends religious service: Not on file    Active member of club or organization: Not on file    Attends meetings of clubs or organizations: Not on file    Relationship  status: Not on file  . Intimate partner violence:    Fear of current or ex partner: Not on file    Emotionally abused: Not on file    Physically abused: Not on file    Forced sexual activity: Not on file  Other Topics Concern  . Not on file  Social History Narrative   Patient is right-handed. She lives alone.She drinks 3-4 cups of coffee a week, and green tea most days.    REVIEW OF SYSTEMS: Constitutional: No fevers, chills, or sweats, no generalized fatigue, change in appetite Eyes: No visual changes, double vision, eye pain Ear, nose and throat: No hearing loss, ear pain, nasal congestion, sore throat Cardiovascular: No chest pain, palpitations Respiratory:  No shortness of breath at rest or with exertion, wheezes GastrointestinaI: No nausea, vomiting, diarrhea, abdominal pain, fecal incontinence Genitourinary:  No dysuria, urinary retention or frequency Musculoskeletal:  Neck pain, back pain Integumentary: No rash, pruritus, skin lesions Neurological: as above Psychiatric: No depression, insomnia, anxiety Endocrine: No palpitations, fatigue, diaphoresis,  mood swings, change in appetite, change in weight, increased thirst Hematologic/Lymphatic:  No purpura, petechiae. Allergic/Immunologic: no itchy/runny eyes, nasal congestion, recent allergic reactions, rashes  PHYSICAL EXAM: .vitalsn General: No acute distress.  Patient appears well-groomed.   Head:  Normocephalic/atraumatic Eyes:  Fundi examined but not visualized Neck: supple, paraspinal tenderness, full range of motion Heart:  Regular rate and rhythm Lungs:  Clear to auscultation bilaterally Back: paraspinal tenderness Neurological Exam: alert and oriented to person, place, and time. Attention span and concentration intact, recent and remote memory intact, fund of knowledge intact.  Speech fluent and not dysarthric, language intact.  CN II-XII intact. Bulk and tone normal, muscle strength 5/5 throughout.  Sensation to light touch intact.  Deep tendon reflexes 2+ throughout.  Finger to nose and heel to shin testing intact.  Gait antalgic, Romberg negative.  IMPRESSION: 1.  Chronic migraine without aura, without status migrainosus, not intractable  PLAN: 1.  Continue topiramate 50 mg at bedtime 2.  Use Excedrin and Zofran as needed for migraine but limit use of pain relievers to no more than 2 days out of the week to prevent risk of rebound headache. 3.  Keep headache diary 4.  Follow-up in 4 months  Metta Clines, DO  CC: Nolene Ebbs, MD

## 2018-08-12 ENCOUNTER — Encounter: Payer: Self-pay | Admitting: Neurology

## 2018-08-12 ENCOUNTER — Ambulatory Visit: Payer: Medicaid Other | Admitting: Neurology

## 2018-08-12 VITALS — BP 104/68 | HR 60 | Ht 62.0 in | Wt 221.0 lb

## 2018-08-12 DIAGNOSIS — G43009 Migraine without aura, not intractable, without status migrainosus: Secondary | ICD-10-CM

## 2018-08-12 NOTE — Patient Instructions (Signed)
1.  Continue topiramate 50mg  at bedtime 2.  Use Excedrin and ondansetron as needed for migraine.  But limit use of pain relievers to no more than 2 days out of week to prevent risk of rebound or medication-overuse headache. 3.  Follow up in 4 months.

## 2018-08-17 ENCOUNTER — Other Ambulatory Visit: Payer: Self-pay | Admitting: Neurology

## 2018-11-02 ENCOUNTER — Ambulatory Visit: Payer: Medicaid Other | Attending: Family Medicine | Admitting: Family Medicine

## 2018-11-02 ENCOUNTER — Encounter: Payer: Self-pay | Admitting: Family Medicine

## 2018-11-02 VITALS — BP 147/86 | HR 64 | Temp 98.1°F | Resp 18 | Ht 62.0 in | Wt 222.0 lb

## 2018-11-02 DIAGNOSIS — R7303 Prediabetes: Secondary | ICD-10-CM

## 2018-11-02 DIAGNOSIS — Z79899 Other long term (current) drug therapy: Secondary | ICD-10-CM | POA: Diagnosis not present

## 2018-11-02 DIAGNOSIS — M797 Fibromyalgia: Secondary | ICD-10-CM

## 2018-11-02 DIAGNOSIS — I10 Essential (primary) hypertension: Secondary | ICD-10-CM

## 2018-11-02 DIAGNOSIS — R5382 Chronic fatigue, unspecified: Secondary | ICD-10-CM | POA: Diagnosis not present

## 2018-11-02 DIAGNOSIS — E785 Hyperlipidemia, unspecified: Secondary | ICD-10-CM

## 2018-11-02 DIAGNOSIS — G9332 Myalgic encephalomyelitis/chronic fatigue syndrome: Secondary | ICD-10-CM

## 2018-11-02 DIAGNOSIS — G894 Chronic pain syndrome: Secondary | ICD-10-CM

## 2018-11-02 LAB — POCT GLYCOSYLATED HEMOGLOBIN (HGB A1C): Hemoglobin A1C: 6.4 % — AB (ref 4.0–5.6)

## 2018-11-02 MED ORDER — HYDROCHLOROTHIAZIDE 25 MG PO TABS: ORAL_TABLET | ORAL | 1 refills | Status: DC

## 2018-11-02 MED ORDER — PANTOPRAZOLE SODIUM 20 MG PO TBEC: 20.0000 mg | DELAYED_RELEASE_TABLET | Freq: Every day | ORAL | 11 refills | Status: DC

## 2018-11-02 MED ORDER — LYRICA 75 MG PO CAPS: 75.0000 mg | ORAL_CAPSULE | Freq: Two times a day (BID) | ORAL | 5 refills | Status: DC

## 2018-11-02 MED ORDER — CELECOXIB 100 MG PO CAPS: 100.0000 mg | ORAL_CAPSULE | Freq: Two times a day (BID) | ORAL | 5 refills | Status: DC

## 2018-11-02 NOTE — Progress Notes (Signed)
Subjective:    Patient ID: Dawn Lucas, female    DOB: Nov 19, 1955, 63 y.o.   MRN: [id number]  HPI       63 year old female new to the practice.  Patient states that she decided to change medical providers due to difficulty getting her Lyrica refilled.  Patient reports that she has fibromyalgia as well as arthritis all over her body and patient tends to hurt a lot.  Patient states that she is currently taking Lyrica 75 mg once daily.  Patient also needs a refill of her blood pressure medication, hydrochlorothiazide 25 mg once daily and patient reports needing refill of Celebrex 100 mg twice daily.  Patient has a list of medications and patient states that she has marked out the medications which she has not taking.  Patient states that she only takes the medications that she finds helpful but has been prescribed other medications as well.  Patient states that she is not sure that the Lyrica is working well.  On review of the medication list with the patient, she has actually prescribed to take the medication 3 times daily but patient states that she was not aware of this and is only taking once per day.       Patient reports that she had been seeing Dr. Cathleen Fears as her rheumatologist and patient has been seeing Dr. Tomi Likens in neurology for chronic migraines.  Patient reports medical history of asthma, hypertension, migraines, fibromyalgia, obstructive sleep apnea requiring use of CPAP and patient also with menopausal symptoms.  Patient states that she also has a history of a breast lump for which she will need to be scheduled for follow-up mammogram at the breast center.  Patient has also been having watery eyes and does have an upcoming appointment with ophthalmology.  Patient has also had nasal congestion, and feels as if she has swelling in her legs at times and patient reports that she currently has no energy.  Patient with complaint of generalized achy pain as well as stiffness.  Currently she states  that her generalized joint pain is about a 3-4 on a 0-to-10 scale but sometimes is as high as a 9.       Patient reports a family history of her mother passing away due to colon cancer.  Patient states that she is up-to-date on her current colonoscopy but because of her family history needs to have a colonoscopy repeated every 5 years.  Patient reports allergies to quinine and to sulfa drugs which cause a rash and itching.  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  Patient does currently smoke approximately half pack per day of cigarettes.  Past Medical History:  Diagnosis Date  . Anemia   . Arthritis    hands, knees  . Asthma   . Back pain   . Fibromyalgia   . Headache   . Hyperlipidemia   . Hypertension 11/04/2011  . Poor dental hygiene    missing and chipped teeth  . Sleep apnea    Uses CPAP nightly  . SVD (spontaneous vaginal delivery)    x 1  . UTI (lower urinary tract infection)    Past Surgical History:  Procedure Laterality Date  . CHOLECYSTECTOMY    . COLON SURGERY    . DILATION AND CURETTAGE OF UTERUS     MAB x 4  . HYSTEROSCOPY W/D&C N/A 02/23/2018   Procedure: DILATATION AND  CURETTAGE /HYSTEROSCOPY;  Surgeon: Mora Bellman, MD;  Location: [facility];  Service: Gynecology;  Laterality: N/A;  . OVARIAN CYST REMOVAL     Family History  Problem Relation Age of Onset  . Colon cancer Mother   . Diabetes type II Maternal Grandmother   . Diabetes type II Paternal Grandmother   . Heart attack Sister   . HIV Brother   . Cirrhosis Brother   . Diabetes Sister   . HIV Brother    Social History   Tobacco Use  . Smoking status: Current Every Day Smoker    Packs/day: 0.50    Years: 50.00    Pack years: 25.00    Types: Cigarettes  . Smokeless tobacco: Former Systems developer    Types: Snuff, Chew  Substance Use Topics  . Alcohol use: Yes    Comment: rarely   . Drug use: No   Allergies    Allergen Reactions  . Quinolones Itching and Rash  . Sulfa Antibiotics Rash   Review of Systems  Constitutional: Positive for chills and fatigue. Negative for fever.  HENT: Positive for congestion, postnasal drip, rhinorrhea and sneezing. Negative for ear pain, facial swelling, hearing loss, nosebleeds, sinus pressure, sinus pain, sore throat and trouble swallowing.   Eyes: Positive for photophobia (during migraines) and visual disturbance (due to watery eyes). Negative for pain, discharge, redness and itching.  Respiratory: Negative for cough, shortness of breath and wheezing.   Cardiovascular: Positive for leg swelling (occasionally). Negative for chest pain and palpitations.  Gastrointestinal: Positive for nausea (with migraines). Negative for abdominal pain, anal bleeding, blood in stool, constipation, diarrhea and vomiting.  Endocrine: Negative for cold intolerance, heat intolerance, polydipsia, polyphagia and polyuria.  Genitourinary: Negative for dysuria and frequency.  Musculoskeletal: Positive for arthralgias, back pain and myalgias. Negative for gait problem, joint swelling, neck pain and neck stiffness.  Skin: Negative for rash and wound.  Allergic/Immunologic: Positive for environmental allergies. Negative for food allergies.  Neurological: Positive for weakness (occasionally has generalized weakness) and headaches. Negative for dizziness, tremors, seizures, syncope, facial asymmetry, speech difficulty, light-headedness and numbness.  Hematological: Negative for adenopathy. Does not bruise/bleed easily.  Psychiatric/Behavioral: Positive for sleep disturbance. Negative for self-injury and suicidal ideas.       Objective:   Physical Exam Vitals signs and nursing note reviewed.  Constitutional:      General: She is not in acute distress.    Appearance: Normal appearance. She is obese. She is not ill-appearing or toxic-appearing.  HENT:     Head: Normocephalic and atraumatic.      Right Ear: Tympanic membrane, ear canal and external ear normal.     Left Ear: Tympanic membrane, ear canal and external ear normal.     Nose: Congestion and rhinorrhea (clear, mild) present.     Mouth/Throat:     Mouth: Mucous membranes are moist.     Pharynx: Oropharynx is clear. Posterior oropharyngeal erythema (mild posterior pharynx erythema) present.  Eyes:     General: No scleral icterus.       Right eye: No discharge.        Left eye: No discharge.     Extraocular Movements: Extraocular movements intact.     Conjunctiva/sclera: Conjunctivae normal.  Neck:     Musculoskeletal: Normal range of motion and neck supple. No muscular tenderness.     Vascular: No carotid bruit.  Cardiovascular:     Rate and Rhythm: Normal rate and regular rhythm.     Heart sounds: Normal heart  sounds. No murmur.  Pulmonary:     Effort: Pulmonary effort is normal. No respiratory distress.     Breath sounds: Normal breath sounds. No wheezing.  Lymphadenopathy:     Cervical: No cervical adenopathy.  Skin:    General: Skin is warm and dry.     Findings: No rash.  Neurological:     General: No focal deficit present.     Mental Status: She is alert and oriented to person, place, and time.     Cranial Nerves: No cranial nerve deficit.    BP (!) 147/86 (BP Location: Left Arm, Patient Position: Sitting, Cuff Size: Large)   Pulse 64   Temp 98.1 F (36.7 C) (Oral)   Resp 18   Ht 5\' 2"  (1.575 m)   Wt 222 lb (100.7 kg)   SpO2 100%   BMI 40.60 kg/m         Assessment & Plan:  1. Prediabetes On review of chart, patient with hemoglobin A1c on 08/23/2015 of 6.1 and on 04/25/2015 of 6.2.  Patient would like to try weight loss and dietary changes.  Patient will have repeat hemoglobin A1c at today's visit as well as comprehensive metabolic panel.  Patient will also be referred to endocrinology per patient request. - HgB A1c - Comprehensive metabolic panel - Ambulatory referral to  Endocrinology  2. Essential hypertension Patient with hypertension for which she is currently on hydrochlorothiazide 25 mg a new prescription provided.  Patient will also have CMP. - Comprehensive metabolic panel - hydrochlorothiazide (HYDRODIURIL) 25 MG tablet; TAKE ONE TABLET BY MOUTH ONCE DAILY TO CONTROL BLOOD PRESSURE  Dispense: 90 tablet; Refill: 1  3. Chronic fatigue syndrome with fibromyalgia Patient reports history of chronic fatigue syndrome for which she has been followed by rheumatology but patient reports that she has been having difficulty obtaining her fibromyalgia medication, Lyrica from her PCP and I am not sure if this also includes from her rheumatologist.  New prescription provided for Lyrica 75 mg twice daily for fibromyalgia and Celebrex 100 mg once or twice daily as needed for pain.  Patient made aware of increased cardiovascular risk of use of Celebrex.  Patient also made aware that Lyrica is considered a controlled substance. - LYRICA 75 MG capsule; Take 1 capsule (75 mg total) by mouth 2 (two) times daily. For fibromyalgia pain  Dispense: 60 capsule; Refill: 5 - celecoxib (CELEBREX) 100 MG capsule; Take 1 capsule (100 mg total) by mouth 2 (two) times daily. As needed for pain  Dispense: 60 capsule; Refill: 5 - CBC with Differential  4. Encounter for long-term (current) use of medications Patient will have complete metabolic panel and CBC in follow-up of her long-term use of high-risk medications including Celebrex and Lyrica as well as hydrochlorothiazide which can alter renal function and potassium. - Comprehensive metabolic panel - CBC with Differential  5. Hyperlipidemia, unspecified hyperlipidemia type Patient with hypertension and obesity as well as prediabetes and patient will have lipid panel and CMP as patient also reports prior history of hyperlipidemia.  Low-fat diet, low-carb diet as well as regular exercise encouraged - Comprehensive metabolic panel - Lipid  panel  6. Chronic pain syndrome Patient reports fibromyalgia and chronic pain.  Patient provided with refill of Celebrex and patient was also asked to start pantoprazole 20 mg daily for stomach protection due to her long-term use of medication which can increase the risk for GI bleed - celecoxib (CELEBREX) 100 MG capsule; Take 1 capsule (100 mg total) by mouth  2 (two) times daily. As needed for pain  Dispense: 60 capsule; Refill: 5 - pantoprazole (PROTONIX) 20 MG tablet; Take 1 tablet (20 mg total) by mouth daily. For stomach protection  Dispense: 30 tablet; Refill: 11 - CBC with Differential  An After Visit Summary was printed and given to the patient.  Allergies as of 11/02/2018      Reactions   Quinolones Itching, Rash   Sulfa Antibiotics Rash      Medication List       Accurate as of November 02, 2018 11:59 PM. Always use your most recent med list.        albuterol 108 (90 Base) MCG/ACT inhaler Commonly known as:  PROVENTIL HFA;VENTOLIN HFA Inhale 2 puffs into the lungs every 6 (six) hours as needed for wheezing or shortness of breath.   aspirin-acetaminophen-caffeine 250-250-65 MG tablet Commonly known as:  EXCEDRIN MIGRAINE Take 2 tablets by mouth every 6 (six) hours as needed for headache.   atorvastatin 20 MG tablet Commonly known as:  LIPITOR Take 20 mg by mouth at bedtime.   baclofen 10 MG tablet Commonly known as:  LIORESAL Take 10 mg by mouth 2 (two) times daily as needed for muscle spasms.   celecoxib 100 MG capsule Commonly known as:  CeleBREX Take 1 capsule (100 mg total) by mouth 2 (two) times daily. As needed for pain   hydrochlorothiazide 25 MG tablet Commonly known as:  HYDRODIURIL TAKE ONE TABLET BY MOUTH ONCE DAILY TO CONTROL BLOOD PRESSURE   Lyrica 75 MG capsule Generic drug:  pregabalin Take 1 capsule (75 mg total) by mouth 2 (two) times daily. For fibromyalgia pain   naproxen 500 MG tablet Commonly known as:  NAPROSYN Take 500 mg by mouth 2  (two) times daily as needed for moderate pain.   olopatadine 0.1 % ophthalmic solution Commonly known as:  PATANOL Place 1 drop into both eyes 2 (two) times daily as needed for allergies.   ondansetron 4 MG tablet Commonly known as:  Zofran Take 1 tablet (4 mg total) by mouth every 8 (eight) hours as needed for nausea or vomiting.   pantoprazole 20 MG tablet Commonly known as:  Protonix Take 1 tablet (20 mg total) by mouth daily. For stomach protection   tiZANidine 4 MG tablet Commonly known as:  ZANAFLEX Take 1 tablet (4 mg total) by mouth at bedtime. as needed   topiramate 50 MG tablet Commonly known as:  TOPAMAX Take 1 tablet (50 mg total) by mouth at bedtime.   traMADol 50 MG tablet Commonly known as:  ULTRAM TAKE 50 MG BY MOUTH EVERY SIX HOURS AS NEEDED FOR PAIN. max 20 tablets PER MONTH   VITAMIN B-12 PO Take 1 tablet by mouth daily.   VITAMIN D-3 PO Take 1 capsule by mouth daily.      Return in about 4 months (around 03/03/2019) for HTN/prediabetes.

## 2018-11-03 ENCOUNTER — Other Ambulatory Visit: Payer: Self-pay | Admitting: Family Medicine

## 2018-11-03 DIAGNOSIS — Z1231 Encounter for screening mammogram for malignant neoplasm of breast: Secondary | ICD-10-CM

## 2018-12-01 ENCOUNTER — Ambulatory Visit
Admission: RE | Admit: 2018-12-01 | Discharge: 2018-12-01 | Disposition: A | Discharge status: Home / Self Care | Payer: Medicaid Other | Source: Ambulatory Visit | Attending: Family Medicine | Admitting: Family Medicine

## 2018-12-01 DIAGNOSIS — Z1231 Encounter for screening mammogram for malignant neoplasm of breast: Secondary | ICD-10-CM

## 2018-12-05 LAB — CBC WITH DIFFERENTIAL/PLATELET
Basophils Absolute: 0 x10E3/uL (ref 0.0–0.2)
Basos: 1 %
EOS (ABSOLUTE): 0.1 x10E3/uL (ref 0.0–0.4)
Eos: 1 %
Hematocrit: 39.7 % (ref 34.0–46.6)
Hemoglobin: 13.1 g/dL (ref 11.1–15.9)
Immature Grans (Abs): 0 x10E3/uL (ref 0.0–0.1)
Immature Granulocytes: 0 %
Lymphocytes Absolute: 3.4 x10E3/uL — ABNORMAL HIGH (ref 0.7–3.1)
Lymphs: 41 %
MCH: 28.7 pg (ref 26.6–33.0)
MCHC: 33 g/dL (ref 31.5–35.7)
MCV: 87 fL (ref 79–97)
Monocytes Absolute: 0.5 x10E3/uL (ref 0.1–0.9)
Monocytes: 6 %
Neutrophils Absolute: 4.3 x10E3/uL (ref 1.4–7.0)
Neutrophils: 51 %
Platelets: 359 x10E3/uL (ref 150–450)
RBC: 4.57 x10E6/uL (ref 3.77–5.28)
RDW: 13.7 % (ref 11.7–15.4)
WBC: 8.3 x10E3/uL (ref 3.4–10.8)

## 2018-12-05 LAB — COMPREHENSIVE METABOLIC PANEL WITH GFR
ALT: 10 IU/L (ref 0–32)
AST: 5 IU/L (ref 0–40)
Albumin/Globulin Ratio: 1.7 (ref 1.2–2.2)
Albumin: 4.5 g/dL (ref 3.8–4.8)
Alkaline Phosphatase: 66 IU/L (ref 39–117)
BUN/Creatinine Ratio: 17 (ref 12–28)
BUN: 17 mg/dL (ref 8–27)
Bilirubin Total: 0.3 mg/dL (ref 0.0–1.2)
CO2: 20 mmol/L (ref 20–29)
Calcium: 9.7 mg/dL (ref 8.7–10.3)
Chloride: 107 mmol/L — ABNORMAL HIGH (ref 96–106)
Creatinine, Ser: 1.01 mg/dL — ABNORMAL HIGH (ref 0.57–1.00)
GFR calc Af Amer: 68 mL/min/1.73
GFR calc non Af Amer: 59 mL/min/1.73 — ABNORMAL LOW
Globulin, Total: 2.7 g/dL (ref 1.5–4.5)
Glucose: 121 mg/dL — ABNORMAL HIGH (ref 65–99)
Potassium: 3.5 mmol/L (ref 3.5–5.2)
Sodium: 145 mmol/L — ABNORMAL HIGH (ref 134–144)
Total Protein: 7.2 g/dL (ref 6.0–8.5)

## 2018-12-05 LAB — LIPID PANEL
Chol/HDL Ratio: 5.1 ratio — ABNORMAL HIGH (ref 0.0–4.4)
Cholesterol, Total: 223 mg/dL — ABNORMAL HIGH (ref 100–199)
HDL: 44 mg/dL
LDL Calculated: 159 mg/dL — ABNORMAL HIGH (ref 0–99)
Triglycerides: 98 mg/dL (ref 0–149)
VLDL Cholesterol Cal: 20 mg/dL (ref 5–40)

## 2018-12-08 ENCOUNTER — Telehealth: Payer: Self-pay | Admitting: *Deleted

## 2018-12-08 NOTE — Telephone Encounter (Signed)
I have not but I will look into it this afternoon.

## 2018-12-08 NOTE — Telephone Encounter (Signed)
Patient verified DOB Patient is aware of labs being normal except for slight elevation in creatine and cholesterol. Patient advised to take medications as prescribed and eliminate sugar and salt from her diet.

## 2018-12-08 NOTE — Telephone Encounter (Signed)
-----   Message from [name], MD sent at 12/06/2018  3:13 PM EST ----- Please notify patient that her complete metabolic panel showed a glucose of 121, creatinine level of 1.01 with normal being 0.57-1.00 and mild increase in both sodium and chloride therefore patient may wish to make sure that she is eliminating extra salt from her diet.  Patient with a normal complete blood count.  Patient with elevated bad cholesterol/LDL on her lipid panel as well as elevated total cholesterol.  LDL is 159 and her goal LDL is 70 or less that she is prediabetic.  Patient needs to take her atorvastatin daily and follow a low-fat diet.

## 2018-12-09 ENCOUNTER — Other Ambulatory Visit: Payer: Self-pay | Admitting: Family Medicine

## 2018-12-09 DIAGNOSIS — G894 Chronic pain syndrome: Secondary | ICD-10-CM

## 2018-12-09 DIAGNOSIS — M797 Fibromyalgia: Secondary | ICD-10-CM

## 2018-12-09 DIAGNOSIS — R5382 Chronic fatigue, unspecified: Principal | ICD-10-CM

## 2018-12-09 DIAGNOSIS — G9332 Myalgic encephalomyelitis/chronic fatigue syndrome: Secondary | ICD-10-CM

## 2018-12-09 MED ORDER — CELECOXIB 200 MG PO CAPS: 200.0000 mg | ORAL_CAPSULE | Freq: Every day | ORAL | 3 refills | Status: DC

## 2018-12-09 NOTE — Telephone Encounter (Signed)
PA for lyrica was approved through 12/08/19. The issue with the celebrex is the twice daily dosing, it is a preferred product and would be covered with once daily dosing of the 100mg  or 200mg  dose. Dr. Chapman Fitch please change to 200mg  once daily if appropriate.

## 2018-12-09 NOTE — Progress Notes (Signed)
Patient ID: Dawn Lucas, female   DOB: May 21, 1956, 63 y.o.   MRN: [id number]   Patient's insurance company will only cover once daily dosing of Celebrex therefore will change prescription for Celebrex from 100 mg twice daily to 200 mg once daily as needed for pain.

## 2018-12-09 NOTE — Telephone Encounter (Signed)
Please notify patient that new prescription for Celebrex 200 mg once daily to take as needed for pain was sent to [facility]

## 2018-12-22 NOTE — Progress Notes (Deleted)
NEUROLOGY FOLLOW UP OFFICE NOTE  Dawn Lucas [id number]  HISTORY OF PRESENT ILLNESS: Dawn Lucas is a 63 year old right-handed woman with osteoarthritis, fibromyalgia, hypertension, hyperlipidemia, obstructive sleep apnea and migraines who follows up for migraines.  UPDATE: Intensity:  Not applicable Duration:  Not applicable Frequency:  No headaches since last visit. Current NSAIDS: Celebrex Current analgesics: Excedrin (not needed), hydromorphone Current triptans: None Current ergotamine: None Current anti-emetic: Zofran 4 mg (not needed) Current muscle relaxants: none Current anti-anxiolytic: None Current sleep aide: None Current Antihypertensive medications: Hydrochlorothiazide Current Antidepressant medications: None Current Anticonvulsant medications: Topiramate 50 mg at bedtime, Lyrica 75 mg 3 times daily Current anti-CGRP: None Current Vitamins/Herbal/Supplements: B12, D3 Current Antihistamines/Decongestants: None Other therapy: No  Caffeine: Pepsi daily.  Coffee 2 to 4 days a week.  Diet: Hydrates Exercise: No Depression: No; Anxiety: No Other pain: Arthritis, fibromyalgia Sleep hygiene: OSA with CPAP.  Reports CPAP is uncomfortable.  HISTORY:  Onset: In her early 29s.  Worse since around 2017. Location:Starts in back of neck and radiates to the front/temples and face, often right sided Quality:aching Initial intensity:severe.Shedenies new headache, thunderclap headache or severe headache that wakes herfrom sleep. Aura:no Prodrome:no Postdrome:no Associated symptoms: Nausea and vomiting.No photophobia, phonophobia, osmophobia, visual disturbance or autonomic symptoms. Shedenies associated unilateral numbness or weakness. Initial duration:All day Initial Frequency:10 to 15 days a month Initial Frequency of abortive medication:Excedrin 2 days a week but takes daily pain relievers for fibromyalgia Triggers: No Relieving  factors: No Activity:aggravates  Past NSAIDS: diclofenac 75mg , ibuprofen, Mobic, naproxen 500mg  Past analgesics:Tylenol, tramadol 50mg  Past abortive triptans:no Past abortive ergotamine:no Past muscle relaxants: Flexeril 10mg , tizanidine (increased headaches) Past anti-emetic:no Past antihypertensive medications:no Past antidepressant medications: amitriptyline 75mg , Cymbalta 20mg , nortriptyline 25mg  Past anticonvulsant medications: gabapentin Past anti-CGRP:no Past vitamins/Herbal/Supplements:no Past antihistamines/decongestants: Flonase Other past therapies:no  Family history of headache:no  PAST MEDICAL HISTORY: Past Medical History:  Diagnosis Date  . Anemia   . Arthritis    hands, knees  . Asthma   . Back pain   . Fibromyalgia   . Headache   . Hyperlipidemia   . Hypertension 11/04/2011  . Poor dental hygiene    missing and chipped teeth  . Sleep apnea    Uses CPAP nightly  . SVD (spontaneous vaginal delivery)    x 1  . UTI (lower urinary tract infection)     MEDICATIONS: Current Outpatient Medications on File Prior to Visit  Medication Sig Dispense Refill  . albuterol (PROVENTIL HFA;VENTOLIN HFA) 108 (90 Base) MCG/ACT inhaler Inhale 2 puffs into the lungs every 6 (six) hours as needed for wheezing or shortness of breath.    Marland Kitchen aspirin-acetaminophen-caffeine (EXCEDRIN MIGRAINE) 250-250-65 MG tablet Take 2 tablets by mouth every 6 (six) hours as needed for headache.    Marland Kitchen atorvastatin (LIPITOR) 20 MG tablet Take 20 mg by mouth at bedtime.    . baclofen (LIORESAL) 10 MG tablet Take 10 mg by mouth 2 (two) times daily as needed for muscle spasms.     . celecoxib (CELEBREX) 200 MG capsule Take 1 capsule (200 mg total) by mouth daily. As needed for pain.  Eat prior to taking the medication 90 capsule 3  . Cholecalciferol (VITAMIN D-3 PO) Take 1 capsule by mouth daily.     . Cyanocobalamin (VITAMIN B-12 PO) Take 1 tablet by mouth daily.    .  hydrochlorothiazide (HYDRODIURIL) 25 MG tablet TAKE ONE TABLET BY MOUTH ONCE DAILY TO CONTROL BLOOD PRESSURE 90 tablet 1  . LYRICA 75  MG capsule Take 1 capsule (75 mg total) by mouth 2 (two) times daily. For fibromyalgia pain 60 capsule 5  . naproxen (NAPROSYN) 500 MG tablet Take 500 mg by mouth 2 (two) times daily as needed for moderate pain.    Marland Kitchen olopatadine (PATANOL) 0.1 % ophthalmic solution Place 1 drop into both eyes 2 (two) times daily as needed for allergies.     Marland Kitchen ondansetron (ZOFRAN) 4 MG tablet Take 1 tablet (4 mg total) by mouth every 8 (eight) hours as needed for nausea or vomiting. 20 tablet 3  . pantoprazole (PROTONIX) 20 MG tablet Take 1 tablet (20 mg total) by mouth daily. For stomach protection 30 tablet 11  . tiZANidine (ZANAFLEX) 4 MG tablet Take 1 tablet (4 mg total) by mouth at bedtime. as needed  2  . topiramate (TOPAMAX) 50 MG tablet Take 1 tablet (50 mg total) by mouth at bedtime. 30 tablet 5  . traMADol (ULTRAM) 50 MG tablet TAKE 50 MG BY MOUTH EVERY SIX HOURS AS NEEDED FOR PAIN. max 20 tablets PER MONTH  2   No current facility-administered medications on file prior to visit.     ALLERGIES: Allergies  Allergen Reactions  . Quinolones Itching and Rash  . Sulfa Antibiotics Rash    FAMILY HISTORY: Family History  Problem Relation Age of Onset  . Colon cancer Mother   . Diabetes type II Maternal Grandmother   . Diabetes type II Paternal Grandmother   . Heart attack Sister   . HIV Brother   . Cirrhosis Brother   . Diabetes Sister   . HIV Brother    SOCIAL HISTORY: Social History   Socioeconomic History  . Marital status: Single    Spouse name: Not on file  . Number of children: 1  . Years of education: Not on file  . Highest education level: 10th grade  Occupational History  . Occupation: disabled  Social Needs  . Financial resource strain: Not on file  . Food insecurity:    Worry: Not on file    Inability: Not on file  . Transportation needs:     Medical: Not on file    Non-medical: Not on file  Tobacco Use  . Smoking status: Current Every Day Smoker    Packs/day: 0.50    Years: 50.00    Pack years: 25.00    Types: Cigarettes  . Smokeless tobacco: Former Systems developer    Types: Snuff, Chew  Substance and Sexual Activity  . Alcohol use: Yes    Comment: rarely   . Drug use: No  . Sexual activity: Yes    Birth control/protection: Condom, Post-menopausal  Lifestyle  . Physical activity:    Days per week: Not on file    Minutes per session: Not on file  . Stress: Not on file  Relationships  . Social connections:    Talks on phone: Not on file    Gets together: Not on file    Attends religious service: Not on file    Active member of club or organization: Not on file    Attends meetings of clubs or organizations: Not on file    Relationship status: Not on file  . Intimate partner violence:    Fear of current or ex partner: Not on file    Emotionally abused: Not on file    Physically abused: Not on file    Forced sexual activity: Not on file  Other Topics Concern  . Not on file  Social  History Narrative   Patient is right-handed. She lives alone.She drinks 3-4 cups of coffee a week, and green tea most days.    REVIEW OF SYSTEMS: Constitutional: No fevers, chills, or sweats, no generalized fatigue, change in appetite Eyes: No visual changes, double vision, eye pain Ear, nose and throat: No hearing loss, ear pain, nasal congestion, sore throat Cardiovascular: No chest pain, palpitations Respiratory:  No shortness of breath at rest or with exertion, wheezes GastrointestinaI: No nausea, vomiting, diarrhea, abdominal pain, fecal incontinence Genitourinary:  No dysuria, urinary retention or frequency Musculoskeletal:  No neck pain, back pain Integumentary: No rash, pruritus, skin lesions Neurological: as above Psychiatric: No depression, insomnia, anxiety Endocrine: No palpitations, fatigue, diaphoresis, mood swings, change in  appetite, change in weight, increased thirst Hematologic/Lymphatic:  No purpura, petechiae. Allergic/Immunologic: no itchy/runny eyes, nasal congestion, recent allergic reactions, rashes  PHYSICAL EXAM: *** General: No acute distress.  Patient appears ***-groomed.   Head:  Normocephalic/atraumatic Eyes:  Fundi examined but not visualized Neck: supple, no paraspinal tenderness, full range of motion Heart:  Regular rate and rhythm Lungs:  Clear to auscultation bilaterally Back: No paraspinal tenderness Neurological Exam: alert and oriented to person, place, and time. Attention span and concentration intact, recent and remote memory intact, fund of knowledge intact.  Speech fluent and not dysarthric, language intact.  CN II-XII intact. Bulk and tone normal, muscle strength 5/5 throughout.  Sensation to light touch, temperature and vibration intact.  Deep tendon reflexes 2+ throughout, toes downgoing.  Finger to nose and heel to shin testing intact.  Gait normal, Romberg negative.  IMPRESSION: ***Migraine without aura, without status migrainosus, not intractable  PLAN: 1.  For preventative management, *** 2.  For abortive therapy, *** 3.  Limit use of pain relievers to no more than 2 days out of week to prevent risk of rebound or medication-overuse headache. 4.  Keep headache diary 5.  Exercise, hydration, caffeine cessation, sleep hygiene, monitor for and avoid triggers 6.  Consider:  magnesium citrate 400mg  daily, riboflavin 400mg  daily, and coenzyme Q10 100mg  three times daily 7.  Follow up ***  Metta Clines, DO  CC: [name], MD

## 2018-12-24 ENCOUNTER — Ambulatory Visit: Payer: Medicaid Other | Admitting: Neurology

## 2019-01-04 ENCOUNTER — Encounter: Payer: Self-pay | Admitting: Family Medicine

## 2019-03-02 ENCOUNTER — Ambulatory Visit: Payer: Medicaid Other | Admitting: Family Medicine

## 2019-03-03 ENCOUNTER — Ambulatory Visit: Payer: Medicaid Other | Admitting: Family Medicine

## 2019-03-09 ENCOUNTER — Ambulatory Visit: Payer: Medicaid Other | Admitting: Family Medicine

## 2019-03-09 NOTE — Progress Notes (Signed)
Per pt pain in her hips and lower back due to cost of medications. Per pt she's just taking the BP meds and drops for her eyes but the Celebrex is too expensive. Per patient the pharmacy said that her insurance won't pay for it.   Joint pain  Lower back hurts a bit when she start to walk a lot.  Per patient she have been losing some weight.  Per patient the only medication she is taking is her BP med and that's it. Per pt she would like

## 2019-03-10 ENCOUNTER — Encounter: Payer: Self-pay | Admitting: Family Medicine

## 2019-03-10 ENCOUNTER — Ambulatory Visit: Payer: Medicaid Other | Attending: Family Medicine | Admitting: Family Medicine

## 2019-03-10 ENCOUNTER — Other Ambulatory Visit: Payer: Self-pay

## 2019-03-10 DIAGNOSIS — G8929 Other chronic pain: Secondary | ICD-10-CM

## 2019-03-10 DIAGNOSIS — G894 Chronic pain syndrome: Secondary | ICD-10-CM

## 2019-03-10 DIAGNOSIS — M545 Low back pain, unspecified: Secondary | ICD-10-CM

## 2019-03-10 MED ORDER — PANTOPRAZOLE SODIUM 20 MG PO TBEC: 20.0000 mg | DELAYED_RELEASE_TABLET | Freq: Every day | ORAL | 11 refills | Status: DC

## 2019-03-10 MED ORDER — MELOXICAM 7.5 MG PO TABS: ORAL_TABLET | ORAL | 5 refills | Status: DC

## 2019-03-10 MED ORDER — BACLOFEN 10 MG PO TABS: 10.0000 mg | ORAL_TABLET | Freq: Two times a day (BID) | ORAL | 5 refills | Status: DC | PRN

## 2019-03-10 NOTE — Progress Notes (Signed)
Virtual Visit via Telephone Note  I connected with Dawn Lucas on 03/10/19 at 10:10 AM EDT by telephone and verified that I am speaking with the correct person using two identifiers.   I discussed the limitations, risks, security and privacy concerns of performing an evaluation and management service by telephone and the availability of in person appointments. I also discussed with the patient that there may be a patient responsible charge related to this service. The patient expressed understanding and agreed to proceed.  Patient Location: Home Provider Location: Office Others participating in call: Emilio Aspen, RMA   History of Present Illness:      Patient reports that she continues to have issues with chronic midline low back pain.  She reports that her back pain is worse when she has been standing for a while.  She has not currently having any issues with radiation of pain down either leg.  Pain is usually dull and aching and ranges from about 3-4 on a 0-10 scale but sometimes is an 8 and has gotten as high as a 10.  She reports that pain has recently been increased as her insurance will no longer cover Celebrex.  She has not been taking any medications to help with her back pain.  She has also run out of muscle relaxant.  She would like to have a refill of baclofen.  She also needs a refill of pantoprazole to help with stomach protection/acid reflux symptoms.  Patient also needs something to help with her pain but it needs to be something that she can afford.      On review of systems, she denies any issues with bladder or bowel dysfunction.  No abdominal pain-no nausea or vomiting.  She has had no blood in the stool and no dark stools.  She denies any shortness of breath or cough.  She has had no chest pain or palpitations.  She denies urinary frequency or dysuria.  She has had no peripheral edema.  She denies any falls in the past 12 months.   Past Medical History:  Diagnosis Date   . Anemia   . Arthritis    hands, knees  . Asthma   . Back pain   . Fibromyalgia   . Headache   . Hyperlipidemia   . Hypertension 11/04/2011  . Poor dental hygiene    missing and chipped teeth  . Sleep apnea    Uses CPAP nightly  . SVD (spontaneous vaginal delivery)    x 1  . UTI (lower urinary tract infection)     Past Surgical History:  Procedure Laterality Date  . CHOLECYSTECTOMY    . COLON SURGERY    . DILATION AND CURETTAGE OF UTERUS     MAB x 4  . HYSTEROSCOPY W/D&C N/A 02/23/2018   Procedure: DILATATION AND CURETTAGE /HYSTEROSCOPY;  Surgeon: Mora Bellman, MD;  Location: [facility];  Service: Gynecology;  Laterality: N/A;  . OVARIAN CYST REMOVAL      Family History  Problem Relation Age of Onset  . Colon cancer Mother   . Diabetes type II Maternal Grandmother   . Diabetes type II Paternal Grandmother   . Heart attack Sister   . HIV Brother   . Cirrhosis Brother   . Diabetes Sister   . HIV Brother     Social History   Tobacco Use  . Smoking status: Current Every Day Smoker    Packs/day: 0.50    Years: 50.00    Pack years: 25.00  Types: Cigarettes  . Smokeless tobacco: Former Systems developer    Types: Snuff, Chew  Substance Use Topics  . Alcohol use: Yes    Comment: rarely   . Drug use: No     Allergies  Allergen Reactions  . Quinolones Itching and Rash  . Sulfa Antibiotics Rash       Observations/Objective: No vital signs or physical exam conducted as visit was done via telephone  Assessment and Plan: 1. Chronic pain syndrome; 2.  Chronic midline low back pain without sciatica Patient with longstanding issues with chronic pain related to midline low back pain without sciatica.  She reports that her insurance will no longer cover Celebrex.  Prescription will be provided for meloxicam/Mobic 7.5 mg to take once or twice per day as needed for pain.  She is provided with pantoprazole for stomach protection due to long-term use of Cox 2 inhibitors/NSAIDs.   Prescription provided for refill of baclofen to take as needed for muscle spasm.  If patient has any worsening of her back pain or if Mobic is not effective she has been asked to call or return.   - meloxicam (MOBIC) 7.5 MG tablet; One pill once or twice per day as needed for pain; eat before taking medication  Dispense: 60 tablet; Refill: 5 - pantoprazole (PROTONIX) 20 MG tablet; Take 1 tablet (20 mg total) by mouth daily. For stomach protection  Dispense: 30 tablet; Refill: 11 - baclofen (LIORESAL) 10 MG tablet; Take 1 tablet (10 mg total) by mouth 2 (two) times daily as needed for muscle spasms.  Dispense: 30 tablet; Refill: 5   Follow Up Instructions:Return in about 1 month (around 04/09/2019) for follow-up of chronic issues.   I discussed the assessment and treatment plan with the patient. The patient was provided an opportunity to ask questions and all were answered. The patient agreed with the plan and demonstrated an understanding of the instructions.   The patient was advised to call back or seek an in-person evaluation if the symptoms worsen or if the condition fails to improve as anticipated.  I provided 8 minutes of non-face-to-face time during this encounter.   [name], MD

## 2019-04-12 NOTE — Progress Notes (Signed)
Office Visit Note  Patient: Dawn Lucas             Date of Birth: 1956-02-04           MRN: [id number]             PCP: [name], MD Referring: Nolene Ebbs, MD Visit Date: 04/26/2019 Occupation: @GUAROCC @  Subjective:  Generalized pain   History of Present Illness: Dawn Lucas is a 63 y.o. female with history of fibromyalgia and osteoarthritis.  She is no longer taking Lyrica, topamax, or tramadol. She switched from celebrex to mobic. She states her pain has improved significantly.  She states her generalized muscle aches and muscle tenderness due to fibromyalgia have improved. She states baclofen has not been effective, so she only takes it very sparingly. She continues to have chronic insomnia.  She has very interrupted sleep at night leading to daytime drowsiness.  She reports she is having pain in both hands but she denies any joint swelling.    Activities of Daily Living:  Patient reports morning stiffness for 20 minutes.   Patient Reports nocturnal pain.  Difficulty dressing/grooming: Denies Difficulty climbing stairs: Reports Difficulty getting out of chair: Denies Difficulty using hands for taps, buttons, cutlery, and/or writing: Denies  Review of Systems  Constitutional: Positive for fatigue.  HENT: Negative for mouth sores, mouth dryness and nose dryness.   Eyes: Positive for dryness. Negative for pain and visual disturbance.  Respiratory: Negative for cough, hemoptysis, shortness of breath and difficulty breathing.   Cardiovascular: Negative for chest pain, palpitations, hypertension and swelling in legs/feet.  Gastrointestinal: Negative for blood in stool, constipation and diarrhea.  Genitourinary: Negative for difficulty urinating and painful urination.  Musculoskeletal: Positive for arthralgias, joint pain, joint swelling, morning stiffness and muscle tenderness. Negative for myalgias, muscle weakness and myalgias.  Skin: Negative for color change, pallor,  rash, hair loss, nodules/bumps, skin tightness, ulcers and sensitivity to sunlight.  Neurological: Negative for dizziness, numbness and headaches.  Hematological: Negative for bruising/bleeding tendency and swollen glands.  Psychiatric/Behavioral: Positive for sleep disturbance. Negative for depressed mood. The patient is not nervous/anxious.     PMFS History:  Patient Active Problem List   Diagnosis Date Noted   Thickened endometrium    Prediabetes 04/28/2015   Pes planus of both feet 03/28/2015   Seborrheic keratoses 08/22/2014   Headache(784.0) 05/17/2014   Lower extremity edema 09/22/2013   Vitamin D deficiency 09/22/2013   Tendinitis of left rotator cuff 06/17/2013   Right ankle pain 06/17/2013   Colon cancer screening 06/17/2013   Knee pain, bilateral 03/20/2012   Hypertension 11/04/2011   Generalized pain 10/15/2011   Back pain 02/08/2011   Obese 02/08/2011   Tobacco abuse 02/08/2011   Sleep disorder 02/08/2011    Past Medical History:  Diagnosis Date   Anemia    Arthritis    hands, knees   Asthma    Back pain    Fibromyalgia    Headache    Hyperlipidemia    Hypertension 11/04/2011   Poor dental hygiene    missing and chipped teeth   Sleep apnea    Uses CPAP nightly   SVD (spontaneous vaginal delivery)    x 1   UTI (lower urinary tract infection)     Family History  Problem Relation Age of Onset   Colon cancer Mother    Diabetes type II Maternal Grandmother    Diabetes type II Paternal Grandmother    Heart attack Sister  HIV Brother    Cirrhosis Brother    Diabetes Sister    HIV Brother    Past Surgical History:  Procedure Laterality Date   CHOLECYSTECTOMY     COLON SURGERY     DILATION AND CURETTAGE OF UTERUS     MAB x 4   HYSTEROSCOPY W/D&C N/A 02/23/2018   Procedure: DILATATION AND CURETTAGE /HYSTEROSCOPY;  Surgeon: Mora Bellman, MD;  Location: [facility];  Service: Gynecology;  Laterality: N/A;    OVARIAN CYST REMOVAL     Social History   Social History Narrative   Patient is right-handed. She lives alone.She drinks 3-4 cups of coffee a week, and green tea most days.   Immunization History  Administered Date(s) Administered   Influenza Split 07/18/2011   Pneumococcal Polysaccharide-23 04/25/2015   Tdap 08/22/2011     Objective: Vital Signs: BP (!) 143/76 (BP Location: Right Arm, Patient Position: Sitting, Cuff Size: Large)    Pulse (!) 57    Resp 16    Ht 5\' 2"  (1.575 m)    Wt 223 lb 3.2 oz (101.2 kg)    BMI 40.82 kg/m    Physical Exam Vitals signs and nursing note reviewed.  Constitutional:      Appearance: She is well-developed.  HENT:     Head: Normocephalic and atraumatic.  Eyes:     Conjunctiva/sclera: Conjunctivae normal.  Neck:     Musculoskeletal: Normal range of motion.  Cardiovascular:     Rate and Rhythm: Normal rate and regular rhythm.     Heart sounds: Normal heart sounds.  Pulmonary:     Effort: Pulmonary effort is normal.     Breath sounds: Normal breath sounds.  Abdominal:     General: Bowel sounds are normal.     Palpations: Abdomen is soft.  Lymphadenopathy:     Cervical: No cervical adenopathy.  Skin:    General: Skin is warm and dry.     Capillary Refill: Capillary refill takes less than 2 seconds.  Neurological:     Mental Status: She is alert and oriented to person, place, and time.  Psychiatric:        Behavior: Behavior normal.      Musculoskeletal Exam: Generalized hyperalgesia and positive tender points. C-spine, thoracic spine, and lumbar spine good ROM.  No midline spinal tenderness.  No SI joint tenderness. Shoulder joints, elbow joints, wrist joints, MCPs, PIPs, and DIPs good ROM with no synovitis.  Hip joints, knee joints, ankle joints, MTPs, PIPs, and DIPs good ROM with no synovitis.  No warmth or effusion of knee joints.  No tenderness or swelling of ankle joints.   CDAI Exam: CDAI Score: -- Patient Global: --; Provider  Global: -- Swollen: --; Tender: -- Joint Exam   No joint exam has been documented for this visit   There is currently no information documented on the homunculus. Go to the Rheumatology activity and complete the homunculus joint exam.  Investigation: No additional findings.  Imaging: No results found.  Recent Labs: Lab Results  Component Value Date   WBC 8.3 12/04/2018   HGB 13.1 12/04/2018   PLT 359 12/04/2018   NA 145 (H) 12/04/2018   K 3.5 12/04/2018   CL 107 (H) 12/04/2018   CO2 20 12/04/2018   GLUCOSE 121 (H) 12/04/2018   BUN 17 12/04/2018   CREATININE 1.01 (H) 12/04/2018   BILITOT 0.3 12/04/2018   ALKPHOS 66 12/04/2018   AST 5 12/04/2018   ALT 10 12/04/2018   PROT 7.2  12/04/2018   ALBUMIN 4.5 12/04/2018   CALCIUM 9.7 12/04/2018   GFRAA 68 12/04/2018    Speciality Comments: No specialty comments available.  Procedures:  No procedures performed Allergies: Quinolones and Sulfa antibiotics   Assessment / Plan:     Visit Diagnoses:  1. Fibromyalgia: She has generalized hyperalgesia and positive tender points.  Her generalized muscle tenderness and muscle aches have improved since her last visit.  She is no longer taking Lyrica, tramadol, or topamax.  Her PCP switched her from Celebrex to Mobic which has provided better pain relief.  She takes baclofen 10 mg BID prn for muscle spasms.  She takes baclofen very sparingly. She continues to have interrupted sleep at night and daytime drowsiness. We discussed the importance of regular exercise and good sleep hygiene.  She will follow up on a yearly basis.   2. Primary osteoarthritis of both hands: She has intermittent pain in both hands.  She has no synovitis or tenderness on exam.  She has complete fist formation bilaterally.  Joint protection and muscle strengthening were discussed.   3. Primary osteoarthritis of both knees: She has good ROM of both knee joints.  She has no warmth or effusion of knee joints.   4. Primary  osteoarthritis of both feet: She has no feet pain or joint swelling at this time.  She wears proper fitting shoes.  She walks for exercise on a regular basis.   5. Chronic midline low back pain without sciatica: Her lower back pain has improved significantly.  She has no midline spinal tenderness.  She has no symptoms of sciatica.    6. Pes planus of both feet: She wears proper fitting shoes.   7. History of vitamin D deficiency: She is taking a vitamin D supplement.   8. History of obesity   9. History of hypertension   10. Prediabetes   11. Smoker   12. History of sleep apnea      Orders: No orders of the defined types were placed in this encounter.  No orders of the defined types were placed in this encounter.     Follow-Up Instructions: Return in about 1 year (around 04/25/2020) for Fibromyalgia, Osteoarthritis.   Ofilia Neas, PA-C   I examined and evaluated the patient with Dawn Sams PA.  Patient continues to have some discomfort.  She had no joint swelling or tenderness on examination.  Although overall her fibromyalgia is fairly well controlled on current medications.  She has been experiencing some insomnia.  Good sleep hygiene and over-the-counter options were discussed.  The plan of care was discussed as noted above.  Bo Merino, MD  Note - This record has been created using Editor, commissioning.  Chart creation errors have been sought, but may not always  have been located. Such creation errors do not reflect on  the standard of medical care.

## 2019-04-26 ENCOUNTER — Encounter: Payer: Self-pay | Admitting: Rheumatology

## 2019-04-26 ENCOUNTER — Other Ambulatory Visit: Payer: Self-pay

## 2019-04-26 ENCOUNTER — Ambulatory Visit (INDEPENDENT_AMBULATORY_CARE_PROVIDER_SITE_OTHER): Payer: Medicaid Other | Admitting: Rheumatology

## 2019-04-26 VITALS — BP 143/76 | HR 57 | Resp 16 | Ht 62.0 in | Wt 223.2 lb

## 2019-04-26 DIAGNOSIS — M19071 Primary osteoarthritis, right ankle and foot: Secondary | ICD-10-CM

## 2019-04-26 DIAGNOSIS — M19072 Primary osteoarthritis, left ankle and foot: Secondary | ICD-10-CM

## 2019-04-26 DIAGNOSIS — M170 Bilateral primary osteoarthritis of knee: Secondary | ICD-10-CM

## 2019-04-26 DIAGNOSIS — R7303 Prediabetes: Secondary | ICD-10-CM

## 2019-04-26 DIAGNOSIS — Z8679 Personal history of other diseases of the circulatory system: Secondary | ICD-10-CM

## 2019-04-26 DIAGNOSIS — M797 Fibromyalgia: Secondary | ICD-10-CM | POA: Diagnosis not present

## 2019-04-26 DIAGNOSIS — F17200 Nicotine dependence, unspecified, uncomplicated: Secondary | ICD-10-CM

## 2019-04-26 DIAGNOSIS — M19041 Primary osteoarthritis, right hand: Secondary | ICD-10-CM | POA: Diagnosis not present

## 2019-04-26 DIAGNOSIS — G8929 Other chronic pain: Secondary | ICD-10-CM

## 2019-04-26 DIAGNOSIS — Z8639 Personal history of other endocrine, nutritional and metabolic disease: Secondary | ICD-10-CM

## 2019-04-26 DIAGNOSIS — Z8669 Personal history of other diseases of the nervous system and sense organs: Secondary | ICD-10-CM

## 2019-04-26 DIAGNOSIS — M2141 Flat foot [pes planus] (acquired), right foot: Secondary | ICD-10-CM

## 2019-04-26 DIAGNOSIS — M2142 Flat foot [pes planus] (acquired), left foot: Secondary | ICD-10-CM

## 2019-04-26 DIAGNOSIS — M19042 Primary osteoarthritis, left hand: Secondary | ICD-10-CM

## 2019-04-26 DIAGNOSIS — M545 Low back pain: Secondary | ICD-10-CM

## 2019-05-06 ENCOUNTER — Other Ambulatory Visit: Payer: Self-pay | Admitting: Family Medicine

## 2019-05-06 DIAGNOSIS — I10 Essential (primary) hypertension: Secondary | ICD-10-CM

## 2019-05-14 ENCOUNTER — Encounter: Payer: Self-pay | Admitting: Family Medicine

## 2019-05-14 ENCOUNTER — Other Ambulatory Visit: Payer: Self-pay

## 2019-05-14 ENCOUNTER — Ambulatory Visit: Payer: Medicaid Other | Attending: Family Medicine | Admitting: Family Medicine

## 2019-05-14 DIAGNOSIS — M545 Low back pain, unspecified: Secondary | ICD-10-CM

## 2019-05-14 DIAGNOSIS — M25552 Pain in left hip: Secondary | ICD-10-CM | POA: Diagnosis not present

## 2019-05-14 DIAGNOSIS — G8929 Other chronic pain: Secondary | ICD-10-CM | POA: Diagnosis not present

## 2019-05-14 DIAGNOSIS — G4701 Insomnia due to medical condition: Secondary | ICD-10-CM

## 2019-05-14 MED ORDER — TIZANIDINE HCL 4 MG PO TABS: ORAL_TABLET | ORAL | 3 refills | Status: DC

## 2019-05-14 NOTE — Progress Notes (Signed)
Still having lower back near the tailbone and hip pain  Per pt her Baclofen is not working

## 2019-05-14 NOTE — Progress Notes (Signed)
Virtual Visit via Telephone Note  I connected with Dawn Lucas on 05/14/19 at  3:10 PM EDT by telephone and verified that I am speaking with the correct person using two identifiers.   I discussed the limitations, risks, security and privacy concerns of performing an evaluation and management service by telephone and the availability of in person appointments. I also discussed with the patient that there may be a patient responsible charge related to this service. The patient expressed understanding and agreed to proceed.  Patient Location: Home Provider Location: [facility] Others participating in call: Call initiated by Emilio Aspen, RMA then transferred to me   History of Present Illness:      63 year old female with complaint of continued low back pain in the middle of her lower back near her tailbone as well as pain in the left hip.  Patient also continues to have lower back muscle spasms that she states that her baclofen is no longer effective.  She reports difficulty sleeping secondary to low back pain and hip pain.  She has recently been seen by her rheumatologist on 04/26/2019 and no changes were made in her medications at that time.  Patient reports that the pain in her lower back/tailbone area is stabbing and ranges from about a 4 to an 8 on a 0-to-10 scale.  Patient reports that she has always had difficulty with sleeping.  She reports both difficulty falling asleep as well as difficulty staying asleep.  She denies any bladder or bowel issues.  She denies any abdominal pain-no nausea or vomiting.  No urinary frequency or dysuria.  No chest pain or palpitations and no shortness of breath or cough.   Past Medical History:  Diagnosis Date  . Anemia   . Arthritis    hands, knees  . Asthma   . Back pain   . Fibromyalgia   . Headache   . Hyperlipidemia   . Hypertension 11/04/2011  . Poor dental hygiene    missing and chipped teeth  . Sleep apnea    Uses CPAP nightly  . SVD  (spontaneous vaginal delivery)    x 1  . UTI (lower urinary tract infection)     Past Surgical History:  Procedure Laterality Date  . CHOLECYSTECTOMY    . COLON SURGERY    . DILATION AND CURETTAGE OF UTERUS     MAB x 4  . HYSTEROSCOPY W/D&C N/A 02/23/2018   Procedure: DILATATION AND CURETTAGE /HYSTEROSCOPY;  Surgeon: Mora Bellman, MD;  Location: [facility];  Service: Gynecology;  Laterality: N/A;  . OVARIAN CYST REMOVAL      Family History  Problem Relation Age of Onset  . Colon cancer Mother   . Diabetes type II Maternal Grandmother   . Diabetes type II Paternal Grandmother   . Heart attack Sister   . HIV Brother   . Cirrhosis Brother   . Diabetes Sister   . HIV Brother     Social History   Tobacco Use  . Smoking status: Current Every Day Smoker    Packs/day: 0.50    Years: 50.00    Pack years: 25.00    Types: Cigarettes  . Smokeless tobacco: Former Systems developer    Types: Snuff, Chew  Substance Use Topics  . Alcohol use: Yes    Comment: rarely   . Drug use: No     Allergies  Allergen Reactions  . Quinolones Itching and Rash  . Sulfa Antibiotics Rash       Observations/Objective:  No vital signs or physical exam conducted as visit was done via telephone  Assessment and Plan: 1. Chronic midline low back pain without sciatica; Left hip pain Patient can continue meloxicam for back and hip pain.  She has had prior x-rays of the lower back with evidence of arthritic changes.  New prescription will be added for Zanaflex to see if this helps with her back pain/muscle spasm and she may also take up to 1-1/2 or 6 mg at bedtime to see if this helps with initiation of sleep. Follow-up with Rheumatology if pain is not improving. - tiZANidine (ZANAFLEX) 4 MG tablet; May take twice per day as needed for muscle spasm and up to 1/2 at bedtime if needed  Dispense: 45 tablet; Refill: 3  3. Insomnia due to medical condition Prescription provided for patient to try Zanaflex to see if this  helps with muscle spasm as well as sleep as medication can cause drowsiness.  Patient should call or schedule follow-up if she is not feeling any better and if sleep has not improved.  Follow Up Instructions: Return in about 3 weeks (around 06/04/2019), or if symptoms worsen or fail to improve, for Follow-up in a few weeks if no improvement in pain/sleep.    I discussed the assessment and treatment plan with the patient. The patient was provided an opportunity to ask questions and all were answered. The patient agreed with the plan and demonstrated an understanding of the instructions.   The patient was advised to call back or seek an in-person evaluation if the symptoms worsen or if the condition fails to improve as anticipated.  I provided 8 minutes of non-face-to-face time during this encounter.   [name], MD

## 2019-05-17 MED ORDER — TIZANIDINE HCL 4 MG PO TABS: ORAL_TABLET | ORAL | 3 refills | Status: DC

## 2019-07-07 ENCOUNTER — Other Ambulatory Visit: Payer: Self-pay | Admitting: Family Medicine

## 2019-07-07 DIAGNOSIS — I10 Essential (primary) hypertension: Secondary | ICD-10-CM

## 2019-10-22 ENCOUNTER — Ambulatory Visit: Payer: Medicaid Other | Admitting: Family Medicine

## 2019-10-28 ENCOUNTER — Telehealth: Payer: Self-pay | Admitting: Family Medicine

## 2019-10-28 DIAGNOSIS — G894 Chronic pain syndrome: Secondary | ICD-10-CM

## 2019-10-28 DIAGNOSIS — I10 Essential (primary) hypertension: Secondary | ICD-10-CM

## 2019-10-28 DIAGNOSIS — G8929 Other chronic pain: Secondary | ICD-10-CM

## 2019-10-28 MED ORDER — PANTOPRAZOLE SODIUM 20 MG PO TBEC: 20.0000 mg | DELAYED_RELEASE_TABLET | Freq: Every day | ORAL | 0 refills | Status: DC

## 2019-10-28 MED ORDER — HYDROCHLOROTHIAZIDE 25 MG PO TABS: 25.0000 mg | ORAL_TABLET | Freq: Every day | ORAL | 0 refills | Status: DC

## 2019-10-28 MED ORDER — MELOXICAM 7.5 MG PO TABS: ORAL_TABLET | ORAL | 0 refills | Status: DC

## 2019-10-28 NOTE — Telephone Encounter (Signed)
Rx sent 

## 2019-10-28 NOTE — Telephone Encounter (Signed)
1) Medication(s) Requested (by name): hydrochlorothiazide (HYDRODIURIL) 25 MG tablet meloxicam (MOBIC) 7.5 MG tablet pantoprazole (PROTONIX) 20 MG tablet    2) Pharmacy of Choice:[facility], [address]   3) Special Requests:   Approved medications will be sent to the pharmacy, we will reach out if there is an issue.  Requests made after 3pm may not be addressed until the following business day!  If a patient is unsure of the name of the medication(s) please note and ask patient to call back when they are able to provide all info, do not send to responsible party until all information is available!

## 2019-11-03 ENCOUNTER — Ambulatory Visit: Payer: Medicaid Other | Attending: Family Medicine | Admitting: Family Medicine

## 2019-11-03 ENCOUNTER — Encounter: Payer: Self-pay | Admitting: Family Medicine

## 2019-11-03 DIAGNOSIS — R7303 Prediabetes: Secondary | ICD-10-CM | POA: Diagnosis not present

## 2019-11-03 DIAGNOSIS — I10 Essential (primary) hypertension: Secondary | ICD-10-CM

## 2019-11-03 DIAGNOSIS — Z79899 Other long term (current) drug therapy: Secondary | ICD-10-CM

## 2019-11-03 DIAGNOSIS — F17210 Nicotine dependence, cigarettes, uncomplicated: Secondary | ICD-10-CM

## 2019-11-03 DIAGNOSIS — R600 Localized edema: Secondary | ICD-10-CM

## 2019-11-03 DIAGNOSIS — R609 Edema, unspecified: Secondary | ICD-10-CM

## 2019-11-03 DIAGNOSIS — M545 Low back pain, unspecified: Secondary | ICD-10-CM

## 2019-11-03 DIAGNOSIS — G8929 Other chronic pain: Secondary | ICD-10-CM

## 2019-11-03 DIAGNOSIS — G894 Chronic pain syndrome: Secondary | ICD-10-CM

## 2019-11-03 DIAGNOSIS — Z720 Tobacco use: Secondary | ICD-10-CM

## 2019-11-03 MED ORDER — PANTOPRAZOLE SODIUM 20 MG PO TBEC: 20.0000 mg | DELAYED_RELEASE_TABLET | Freq: Every day | ORAL | 1 refills | Status: DC

## 2019-11-03 MED ORDER — HYDROCHLOROTHIAZIDE 25 MG PO TABS: 25.0000 mg | ORAL_TABLET | Freq: Every day | ORAL | 1 refills | Status: DC

## 2019-11-03 MED ORDER — ALBUTEROL SULFATE HFA 108 (90 BASE) MCG/ACT IN AERS: 2.0000 | INHALATION_SPRAY | Freq: Four times a day (QID) | RESPIRATORY_TRACT | 2 refills | Status: DC | PRN

## 2019-11-03 MED ORDER — MELOXICAM 7.5 MG PO TABS: ORAL_TABLET | ORAL | 3 refills | Status: DC

## 2019-11-03 NOTE — Progress Notes (Signed)
Virtual Visit via Telephone Note  I connected with Dawn Lucas on 11/03/19 at 2:21 PM by telephone (appointment was at 1:50 but patient could not initially be reached after calling x 2)  and verified that I am speaking with the correct person using two identifiers.   I discussed the limitations, risks, security and privacy concerns of performing an evaluation and management service by telephone and the availability of in person appointments. I also discussed with the patient that there may be a patient responsible charge related to this service. The patient expressed understanding and agreed to proceed.  Patient Location: Home Provider Location: [facility]  Others participating in call: none   History of Present Illness:        64 year old female seen in follow-up of chronic medical issues including hypertension, prediabetes and chronic low back pain without radiation/chronic pain syndrome.  She also has complaints of issues with swelling in her lower legs.  Swelling generally resolves overnight and then recurs throughout the day.  She denies any issues of increased thirst or urinary frequency associated with prediabetes.  She continues to take hydrochlorothiazide for her blood pressure.  She denies headaches or dizziness related to her blood pressure.  Back pain is chronic and dull/aching.  Occasional sharp pain with certain movements.  Increased pain with prolonged sitting/standing or walking.  Pain is a 6 or less with the use of meloxicam for which she needs a refill.   Past Medical History:  Diagnosis Date  . Anemia   . Arthritis    hands, knees  . Asthma   . Back pain   . Fibromyalgia   . Headache   . Hyperlipidemia   . Hypertension 11/04/2011  . Poor dental hygiene    missing and chipped teeth  . Sleep apnea    Uses CPAP nightly  . SVD (spontaneous vaginal delivery)    x 1  . UTI (lower urinary tract infection)     Past Surgical History:  Procedure Laterality Date  .  CHOLECYSTECTOMY    . COLON SURGERY    . DILATION AND CURETTAGE OF UTERUS     MAB x 4  . HYSTEROSCOPY WITH D & C N/A 02/23/2018   Procedure: DILATATION AND CURETTAGE /HYSTEROSCOPY;  Surgeon: Mora Bellman, MD;  Location: [facility];  Service: Gynecology;  Laterality: N/A;  . OVARIAN CYST REMOVAL      Family History  Problem Relation Age of Onset  . Colon cancer Mother   . Diabetes type II Maternal Grandmother   . Diabetes type II Paternal Grandmother   . Heart attack Sister   . HIV Brother   . Cirrhosis Brother   . Diabetes Sister   . HIV Brother     Social History   Tobacco Use  . Smoking status: Current Every Day Smoker    Packs/day: 0.50    Years: 50.00    Pack years: 25.00    Types: Cigarettes  . Smokeless tobacco: Former Systems developer    Types: Snuff, Chew  Substance Use Topics  . Alcohol use: Yes    Comment: rarely   . Drug use: No     Allergies  Allergen Reactions  . Quinolones Itching and Rash  . Sulfa Antibiotics Rash       Observations/Objective: No vital signs or physical exam conducted as visit was done via telephone  Assessment and Plan: 1. Chronic pain syndrome Patient with complaint of chronic pain especially in her lower back for which she takes Mobic.  She has also been asked to start the use of Protonix 20 mg once daily to help prevent gastritis/GI bleed due to her use of Mobic.  Medication refills given at today's visit. - pantoprazole (PROTONIX) 20 MG tablet; Take 1 tablet (20 mg total) by mouth daily. For stomach protection  Dispense: 90 tablet; Refill: 1 - meloxicam (MOBIC) 7.5 MG tablet; One pill once or twice per day as needed for pain; eat before taking medication (Patient taking differently: Take 7.5 mg by mouth 2 (two) times daily as needed for pain. One pill once or twice per day as needed for pain; eat before taking medication)  Dispense: 60 tablet; Refill: 3  2. Essential hypertension Refill provided of hydrochlorothiazide and patient is encouraged  to monitor her blood pressure and/or schedule follow-up visit for blood pressure recheck. - hydrochlorothiazide (HYDRODIURIL) 25 MG tablet; Take 1 tablet (25 mg total) by mouth daily.  Dispense: 90 tablet; Refill: 1 - Lipid panel  3. Prediabetes She has been asked to schedule lab visit and follow-up of prediabetes including comprehensive metabolic panel, hemoglobin A1c and lipid panel. - Comprehensive metabolic panel; Future - Hemoglobin A1c; Future - Lipid panel  4. Encounter for long-term (current) use of medications Patient has been asked to schedule future lab visit for comprehensive metabolic panel in follow-up of his multiple medications. - Comprehensive metabolic panel; Future  5. Chronic midline low back pain without sciatica Meloxicam refilled for treatment of chronic low back pain. - meloxicam (MOBIC) 7.5 MG tablet; One pill once or twice per day as needed for pain; eat before taking medication (Patient taking differently: Take 7.5 mg by mouth 2 (two) times daily as needed for pain. One pill once or twice per day as needed for pain; eat before taking medication)  Dispense: 60 tablet; Refill: 3  6. Tobacco use Refill provided of albuterol inhaler per patient's request.  She was made aware that when she is ready to stop smoking, she can contact the office for an appointment with the clinical pharmacist for help with smoking cessation. - albuterol (VENTOLIN HFA) 108 (90 Base) MCG/ACT inhaler; Inhale 2 puffs into the lungs every 6 (six) hours as needed for wheezing or shortness of breath.  Dispense: 18 g; Refill: 2  7. Peripheral edema Patient with complaint of peripheral edema.  Discussed the need to make sure that she is following a low-sodium diet as well as remaining active as edema can also be related to being sedentary and having legs in a dependent position.  She is asked to make a lab visit to have comprehensive metabolic panel, CBC and TSH to look for other possible causes of  her edema such as electrolyte abnormality, anemia or thyroid disorder. - Comprehensive metabolic panel; Future - CBC - T4 AND TSH  Follow Up Instructions:    I discussed the assessment and treatment plan with the patient. The patient was provided an opportunity to ask questions and all were answered. The patient agreed with the plan and demonstrated an understanding of the instructions.   The patient was advised to call back or seek an in-person evaluation if the symptoms worsen or if the condition fails to improve as anticipated.  I provided 12 minutes of non-face-to-face time during this encounter.   [name], MD

## 2019-11-03 NOTE — Progress Notes (Signed)
Pt. Is asking if PCP can give her additional refills for her hydrochlorothiazide, meloxicam, and pantoprazole.

## 2019-11-29 ENCOUNTER — Other Ambulatory Visit: Payer: Self-pay | Admitting: Family Medicine

## 2019-11-29 DIAGNOSIS — Z1231 Encounter for screening mammogram for malignant neoplasm of breast: Secondary | ICD-10-CM

## 2019-12-07 ENCOUNTER — Other Ambulatory Visit: Payer: Self-pay | Admitting: Gastroenterology

## 2019-12-31 ENCOUNTER — Other Ambulatory Visit: Payer: Self-pay

## 2019-12-31 ENCOUNTER — Ambulatory Visit
Admission: RE | Admit: 2019-12-31 | Discharge: 2019-12-31 | Disposition: A | Discharge status: Home / Self Care | Payer: Medicaid Other | Source: Ambulatory Visit | Attending: Family Medicine | Admitting: Family Medicine

## 2019-12-31 DIAGNOSIS — Z1231 Encounter for screening mammogram for malignant neoplasm of breast: Secondary | ICD-10-CM

## 2020-01-14 ENCOUNTER — Other Ambulatory Visit (HOSPITAL_COMMUNITY)
Admission: RE | Admit: 2020-01-14 | Discharge: 2020-01-14 | Disposition: A | Discharge status: Home / Self Care | Payer: Medicare Other | Source: Ambulatory Visit | Attending: Gastroenterology | Admitting: Gastroenterology

## 2020-01-14 DIAGNOSIS — Z20822 Contact with and (suspected) exposure to covid-19: Secondary | ICD-10-CM | POA: Diagnosis not present

## 2020-01-14 DIAGNOSIS — Z01812 Encounter for preprocedural laboratory examination: Secondary | ICD-10-CM | POA: Diagnosis present

## 2020-01-14 LAB — SARS CORONAVIRUS 2 (TAT 6-24 HRS): SARS Coronavirus 2: NEGATIVE

## 2020-01-18 ENCOUNTER — Other Ambulatory Visit: Payer: Self-pay

## 2020-01-18 ENCOUNTER — Ambulatory Visit (HOSPITAL_COMMUNITY): Payer: Medicare Other | Admitting: Anesthesiology

## 2020-01-18 ENCOUNTER — Encounter (HOSPITAL_COMMUNITY): Payer: Self-pay | Admitting: Gastroenterology

## 2020-01-18 ENCOUNTER — Encounter (HOSPITAL_COMMUNITY)
Admission: RE | Disposition: A | Discharge status: Home / Self Care | Payer: Self-pay | Source: Home / Self Care | Attending: Gastroenterology

## 2020-01-18 ENCOUNTER — Ambulatory Visit (HOSPITAL_COMMUNITY)
Admission: RE | Admit: 2020-01-18 | Discharge: 2020-01-18 | Disposition: A | Discharge status: Home / Self Care | Payer: Medicare Other | Attending: Gastroenterology | Admitting: Gastroenterology

## 2020-01-18 DIAGNOSIS — Z1211 Encounter for screening for malignant neoplasm of colon: Secondary | ICD-10-CM | POA: Insufficient documentation

## 2020-01-18 DIAGNOSIS — Z5329 Procedure and treatment not carried out because of patient's decision for other reasons: Secondary | ICD-10-CM | POA: Insufficient documentation

## 2020-01-18 DIAGNOSIS — Z86010 Personal history of colonic polyps: Secondary | ICD-10-CM | POA: Diagnosis present

## 2020-01-18 SURGERY — CANCELLED PROCEDURE
Anesthesia: Monitor Anesthesia Care

## 2020-01-18 MED ORDER — LACTATED RINGERS IV SOLN: INTRAVENOUS | Status: DC | PRN

## 2020-01-18 NOTE — Progress Notes (Signed)
RN attempted IV stick x1 with patient. Pt told nurse to stop sticking her. [place] CRNA made aware. He called DR. Tobias Alexander to come look. Dr. Tobias Alexander used ultrasound. He found no viable vein to stick. Pt did not want to be stuck un-necessarily. IR department called and they stated they did not have time in their schedule to attempt and IV today.  Dr. Watt Climes made aware and other RN discussed the options with the patient. Pt stated that she wanted to reschedule for a later time. She did not want to come back in tomorrow to attempt this. Pt wanted to wait a month or so and then reschedule to come in to IR to have IV placed and then colon later. Dr. Watt Climes agreed to this plan. Pt sent home at this time.

## 2020-01-18 NOTE — Anesthesia Preprocedure Evaluation (Addendum)
Anesthesia Evaluation  Patient identified by MRN, date of birth, ID band Patient awake    Reviewed: Allergy & Precautions, NPO status , Patient's Chart, lab work & pertinent test results  History of Anesthesia Complications Negative for: history of anesthetic complications  Airway Mallampati: III  TM Distance: >3 FB Neck ROM: Full    Dental  (+) Poor Dentition, Dental Advisory Given,    Pulmonary asthma , sleep apnea and Continuous Positive Airway Pressure Ventilation , Current Smoker and Patient abstained from smoking.,    Pulmonary exam normal        Cardiovascular hypertension, Pt. on medications Normal cardiovascular exam     Neuro/Psych  Headaches,    GI/Hepatic negative GI ROS, Neg liver ROS,   Endo/Other  Morbid obesity  Renal/GU negative Renal ROS  negative genitourinary   Musculoskeletal  (+) Arthritis , Fibromyalgia -  Abdominal   Peds  Hematology negative hematology ROS (+)   Anesthesia Other Findings   Reproductive/Obstetrics                            Anesthesia Physical  Anesthesia Plan  ASA: III  Anesthesia Plan: MAC   Post-op Pain Management:    Induction:   PONV Risk Score and Plan: 2 and Ondansetron, Propofol infusion and Midazolam  Airway Management Planned: Natural Airway, Simple Face Mask and Nasal Cannula  Additional Equipment: None  Intra-op Plan:   Post-operative Plan:   Informed Consent: I have reviewed the patients History and Physical, chart, labs and discussed the procedure including the risks, benefits and alternatives for the proposed anesthesia with the patient or authorized representative who has indicated his/her understanding and acceptance.     Dental advisory given  Plan Discussed with: Anesthesiologist and CRNA  Anesthesia Plan Comments: (Endo nurse attempted IV start by was unsuccessful.  Pt has fibromyalgia and complained of  extreme pain.  I discussed with patient that we cannot do her procedure without an IV.  I did not see any obvious veins  With the vein finder or ultrasound.  I offered to attempt an iv or a central line, but she wanted a me to guarantee I would succeed with the first attempt at a peripheral iv and refused CVL.  She decided to cancel the procedure.)      Anesthesia Quick Evaluation

## 2020-04-12 NOTE — Progress Notes (Signed)
Office Visit Note  Patient: Dawn Lucas             Date of Birth: [birth year]           MRN: [id number]             PCP: Sonia Side., FNP Referring: [name], MD Visit Date: 04/25/2020 Occupation: @GUAROCC @  Subjective:  Edema and Numbness of the Left Ankle, Edema and Numbness of the Right Ankle, and Pain of the Lower Back   History of Present Illness: Dawn Lucas is a 64 y.o. female with history of osteoarthritis, fibromyalgia and degenerative disc disease.  She states she continues to have lower back pain.  She was seen by her PCP who did some evaluation and is planning to refer her to a back specialist.  She states she continues to have some fluid retention in her bilateral ankles and is still swollen.  She continues to have discomfort in her hands and feet.  She also experiencing pain from fibromyalgia and is very tender to touch.  Activities of Daily Living:  Patient reports morning stiffness for 1 hour.   Patient Reports nocturnal pain.  Difficulty dressing/grooming: Denies Difficulty climbing stairs: Reports Difficulty getting out of chair: Reports Difficulty using hands for taps, buttons, cutlery, and/or writing: Reports  Review of Systems  Constitutional: Negative for fatigue.  HENT: Negative for mouth sores, mouth dryness and nose dryness.   Eyes: Negative for itching and dryness.  Respiratory: Positive for wheezing. Negative for shortness of breath and difficulty breathing.   Cardiovascular: Positive for swelling in legs/feet. Negative for chest pain and palpitations.  Gastrointestinal: Negative for blood in stool, constipation and diarrhea.  Endocrine: Negative for increased urination.  Genitourinary: Negative for difficulty urinating.  Musculoskeletal: Positive for arthralgias, joint pain, joint swelling, myalgias, muscle weakness, morning stiffness, muscle tenderness and myalgias.  Skin: Negative for rash and redness.  Allergic/Immunologic: Negative  for susceptible to infections.  Neurological: Positive for numbness. Negative for dizziness, headaches, memory loss and weakness.  Hematological: Positive for bruising/bleeding tendency.  Psychiatric/Behavioral: Positive for sleep disturbance. Negative for confusion.    PMFS History:  Patient Active Problem List   Diagnosis Date Noted  . Thickened endometrium   . Prediabetes 04/28/2015  . Pes planus of both feet 03/28/2015  . Seborrheic keratoses 08/22/2014  . Headache(784.0) 05/17/2014  . Lower extremity edema 09/22/2013  . Vitamin D deficiency 09/22/2013  . Tendinitis of left rotator cuff 06/17/2013  . Right ankle pain 06/17/2013  . Colon cancer screening 06/17/2013  . Knee pain, bilateral 03/20/2012  . Hypertension 11/04/2011  . Generalized pain 10/15/2011  . Back pain 02/08/2011  . Obese 02/08/2011  . Tobacco abuse 02/08/2011  . Sleep disorder 02/08/2011    Past Medical History:  Diagnosis Date  . Anemia   . Arthritis    hands, knees  . Asthma   . Back pain   . Fibromyalgia   . Headache   . Hyperlipidemia   . Hypertension 11/04/2011  . Poor dental hygiene    missing and chipped teeth  . Sleep apnea    Uses CPAP nightly  . SVD (spontaneous vaginal delivery)    x 1  . UTI (lower urinary tract infection)     Family History  Problem Relation Age of Onset  . Colon cancer Mother   . Diabetes type II Maternal Grandmother   . Diabetes type II Paternal Grandmother   . Heart attack Sister   . HIV Brother   .  Cirrhosis Brother   . Diabetes Sister   . HIV Brother    Past Surgical History:  Procedure Laterality Date  . CHOLECYSTECTOMY    . COLON SURGERY    . DILATION AND CURETTAGE OF UTERUS     MAB x 4  . HYSTEROSCOPY WITH D & C N/A 02/23/2018   Procedure: DILATATION AND CURETTAGE /HYSTEROSCOPY;  Surgeon: Mora Bellman, MD;  Location: [facility];  Service: Gynecology;  Laterality: N/A;  . OVARIAN CYST REMOVAL     Social History   Social History Narrative    Patient is right-handed. She lives alone.She drinks 3-4 cups of coffee a week, and green tea most days.   Immunization History  Administered Date(s) Administered  . Influenza Split 07/18/2011  . Pneumococcal Polysaccharide-23 04/25/2015  . Tdap 08/22/2011     Objective: Vital Signs: BP (!) 142/85 (BP Location: Left Arm, Patient Position: Sitting, Cuff Size: Large)   Pulse 68   Resp 14   Ht 5\' 2"  (1.575 m)   Wt 234 lb 6.4 oz (106.3 kg)   BMI 42.87 kg/m    Physical Exam Vitals and nursing note reviewed.  Constitutional:      Appearance: She is well-developed.  HENT:     Head: Normocephalic and atraumatic.  Eyes:     Conjunctiva/sclera: Conjunctivae normal.  Cardiovascular:     Rate and Rhythm: Normal rate and regular rhythm.     Heart sounds: Normal heart sounds.  Pulmonary:     Effort: Pulmonary effort is normal.     Breath sounds: Normal breath sounds.  Abdominal:     General: Bowel sounds are normal.     Palpations: Abdomen is soft.  Musculoskeletal:     Cervical back: Normal range of motion.     Right lower leg: Edema present.     Left lower leg: Edema present.  Lymphadenopathy:     Cervical: No cervical adenopathy.  Skin:    General: Skin is warm and dry.     Capillary Refill: Capillary refill takes less than 2 seconds.  Neurological:     Mental Status: She is alert and oriented to person, place, and time.  Psychiatric:        Behavior: Behavior normal.      Musculoskeletal Exam: C-spine was in good range of motion.  Lumbar spine was difficult to assess as she was in a lot of discomfort.  Shoulder joints, elbow joints, wrist joints, MCPs PIPs and DIPs with full range of motion with no synovitis.  Hip joints, knee joints, ankles, MTPs and PIPs with good range of motion with no synovitis.  She has significant pedal edema bilaterally.  She has generalized hyperalgesia and positive tender points.  CDAI Exam: CDAI Score: -- Patient Global: --; Provider Global:  -- Swollen: --; Tender: -- Joint Exam 04/25/2020   No joint exam has been documented for this visit   There is currently no information documented on the homunculus. Go to the Rheumatology activity and complete the homunculus joint exam.  Investigation: No additional findings.  Imaging: No results found.  Recent Labs: Lab Results  Component Value Date   WBC 8.3 12/04/2018   HGB 13.1 12/04/2018   PLT 359 12/04/2018   NA 145 (H) 12/04/2018   K 3.5 12/04/2018   CL 107 (H) 12/04/2018   CO2 20 12/04/2018   GLUCOSE 121 (H) 12/04/2018   BUN 17 12/04/2018   CREATININE 1.01 (H) 12/04/2018   BILITOT 0.3 12/04/2018   ALKPHOS 66 12/04/2018  AST 5 12/04/2018   ALT 10 12/04/2018   PROT 7.2 12/04/2018   ALBUMIN 4.5 12/04/2018   CALCIUM 9.7 12/04/2018   GFRAA 68 12/04/2018    Speciality Comments: No specialty comments available.  Procedures:  No procedures performed Allergies: Quinolones and Sulfa antibiotics   Assessment / Plan:     Visit Diagnoses: Fibromyalgia-she continues to have generalized pain and discomfort.  She has positive tender points and hyperalgesia.  Need for regular exercise discussed.  Primary osteoarthritis of both hands-she had no synovitis on examination.  Joint protection was discussed.  Primary osteoarthritis of both knees-she had no warmth swelling or effusion.  Primary osteoarthritis of both feet-she has chronic discomfort.  Pes planus of both feet-good arch support was emphasized.  Pedal edema-use of compression socks was discussed.  DDD (degenerative disc disease), lumbar-patient states she has been having increased lower back pain.  She has been referred to back a specialist per patient.  I reviewed old x-rays which showed mild spondylosis.  History of vitamin D deficiency-she is on vitamin D supplement.  History of obesity-dietary modifications were discussed.  History of hypertension-her blood pressures are still  elevated.  Prediabetes-weight loss and diet will help her blood glucose levels.  Smoker-smoking cessation was discussed.  History of sleep apnea  Orders: No orders of the defined types were placed in this encounter.  No orders of the defined types were placed in this encounter.     Follow-Up Instructions: Return in about 6 months (around 10/26/2020), or if symptoms worsen or fail to improve, for Osteoarthritis,FMS.   Bo Merino, MD  Note - This record has been created using Editor, commissioning.  Chart creation errors have been sought, but may not always  have been located. Such creation errors do not reflect on  the standard of medical care.

## 2020-04-25 ENCOUNTER — Other Ambulatory Visit: Payer: Self-pay

## 2020-04-25 ENCOUNTER — Ambulatory Visit (INDEPENDENT_AMBULATORY_CARE_PROVIDER_SITE_OTHER): Payer: Medicare Other | Admitting: Rheumatology

## 2020-04-25 ENCOUNTER — Encounter: Payer: Self-pay | Admitting: Rheumatology

## 2020-04-25 VITALS — BP 142/85 | HR 68 | Resp 14 | Ht 62.0 in | Wt 234.4 lb

## 2020-04-25 DIAGNOSIS — M51369 Other intervertebral disc degeneration, lumbar region without mention of lumbar back pain or lower extremity pain: Secondary | ICD-10-CM

## 2020-04-25 DIAGNOSIS — M19041 Primary osteoarthritis, right hand: Secondary | ICD-10-CM | POA: Diagnosis not present

## 2020-04-25 DIAGNOSIS — Z8669 Personal history of other diseases of the nervous system and sense organs: Secondary | ICD-10-CM

## 2020-04-25 DIAGNOSIS — M170 Bilateral primary osteoarthritis of knee: Secondary | ICD-10-CM | POA: Diagnosis not present

## 2020-04-25 DIAGNOSIS — Z8639 Personal history of other endocrine, nutritional and metabolic disease: Secondary | ICD-10-CM

## 2020-04-25 DIAGNOSIS — M797 Fibromyalgia: Secondary | ICD-10-CM

## 2020-04-25 DIAGNOSIS — F17200 Nicotine dependence, unspecified, uncomplicated: Secondary | ICD-10-CM

## 2020-04-25 DIAGNOSIS — M19071 Primary osteoarthritis, right ankle and foot: Secondary | ICD-10-CM | POA: Diagnosis not present

## 2020-04-25 DIAGNOSIS — M19042 Primary osteoarthritis, left hand: Secondary | ICD-10-CM

## 2020-04-25 DIAGNOSIS — R600 Localized edema: Secondary | ICD-10-CM

## 2020-04-25 DIAGNOSIS — M19072 Primary osteoarthritis, left ankle and foot: Secondary | ICD-10-CM

## 2020-04-25 DIAGNOSIS — M2141 Flat foot [pes planus] (acquired), right foot: Secondary | ICD-10-CM

## 2020-04-25 DIAGNOSIS — R7303 Prediabetes: Secondary | ICD-10-CM

## 2020-04-25 DIAGNOSIS — M2142 Flat foot [pes planus] (acquired), left foot: Secondary | ICD-10-CM

## 2020-04-25 DIAGNOSIS — M5136 Other intervertebral disc degeneration, lumbar region: Secondary | ICD-10-CM

## 2020-04-25 DIAGNOSIS — Z8679 Personal history of other diseases of the circulatory system: Secondary | ICD-10-CM

## 2020-04-25 NOTE — Patient Instructions (Signed)

## 2020-09-06 ENCOUNTER — Other Ambulatory Visit (HOSPITAL_COMMUNITY): Payer: Self-pay | Admitting: Family

## 2020-09-06 DIAGNOSIS — R600 Localized edema: Secondary | ICD-10-CM

## 2020-09-06 DIAGNOSIS — I509 Heart failure, unspecified: Secondary | ICD-10-CM

## 2020-09-08 ENCOUNTER — Other Ambulatory Visit: Payer: Self-pay

## 2020-09-08 ENCOUNTER — Other Ambulatory Visit: Payer: Self-pay | Admitting: Family

## 2020-09-08 ENCOUNTER — Ambulatory Visit
Admission: RE | Admit: 2020-09-08 | Discharge: 2020-09-08 | Disposition: A | Discharge status: Home / Self Care | Payer: Medicare Other | Source: Ambulatory Visit | Attending: Family | Admitting: Family

## 2020-09-08 DIAGNOSIS — R0602 Shortness of breath: Secondary | ICD-10-CM

## 2020-10-03 ENCOUNTER — Ambulatory Visit (HOSPITAL_COMMUNITY)
Admission: RE | Admit: 2020-10-03 | Discharge: 2020-10-03 | Disposition: A | Discharge status: Home / Self Care | Payer: Medicare Other | Source: Ambulatory Visit | Attending: Family | Admitting: Family

## 2020-10-03 ENCOUNTER — Other Ambulatory Visit (HOSPITAL_COMMUNITY): Payer: Medicare Other

## 2020-10-03 ENCOUNTER — Other Ambulatory Visit: Payer: Self-pay

## 2020-10-03 DIAGNOSIS — R600 Localized edema: Secondary | ICD-10-CM | POA: Diagnosis not present

## 2020-10-03 DIAGNOSIS — I509 Heart failure, unspecified: Secondary | ICD-10-CM | POA: Diagnosis not present

## 2020-10-03 DIAGNOSIS — I110 Hypertensive heart disease with heart failure: Secondary | ICD-10-CM | POA: Insufficient documentation

## 2020-10-03 DIAGNOSIS — I351 Nonrheumatic aortic (valve) insufficiency: Secondary | ICD-10-CM | POA: Diagnosis not present

## 2020-10-03 DIAGNOSIS — F17210 Nicotine dependence, cigarettes, uncomplicated: Secondary | ICD-10-CM | POA: Diagnosis not present

## 2020-10-03 DIAGNOSIS — E785 Hyperlipidemia, unspecified: Secondary | ICD-10-CM | POA: Diagnosis not present

## 2020-10-03 LAB — ECHOCARDIOGRAM COMPLETE
Area-P 1/2: 2.95 cm2
Calc EF: 50.1 %
MV M vel: 4.54 m/s
MV Peak grad: 82.4 mmHg
Radius: 0.4 cm
S' Lateral: 3.4 cm
Single Plane A2C EF: 51.8 %
Single Plane A4C EF: 47.8 %

## 2020-11-15 ENCOUNTER — Telehealth: Payer: Self-pay

## 2020-11-15 NOTE — Telephone Encounter (Signed)
NOTES ON FILE FROM [facility] [phone] SENT REFERRAL TO SCHEDULING

## 2020-11-17 ENCOUNTER — Encounter: Payer: Self-pay | Admitting: Physical Medicine & Rehabilitation

## 2020-11-30 ENCOUNTER — Other Ambulatory Visit: Payer: Self-pay | Admitting: Family

## 2020-11-30 DIAGNOSIS — Z1231 Encounter for screening mammogram for malignant neoplasm of breast: Secondary | ICD-10-CM

## 2020-12-14 ENCOUNTER — Other Ambulatory Visit: Payer: Self-pay

## 2020-12-14 ENCOUNTER — Encounter: Payer: Self-pay | Admitting: Physical Medicine & Rehabilitation

## 2020-12-14 ENCOUNTER — Encounter: Payer: Medicare Other | Attending: Physical Medicine & Rehabilitation | Admitting: Physical Medicine & Rehabilitation

## 2020-12-14 VITALS — BP 145/77 | HR 79 | Temp 98.5°F | Ht 62.0 in | Wt 240.0 lb

## 2020-12-14 DIAGNOSIS — M797 Fibromyalgia: Secondary | ICD-10-CM

## 2020-12-14 DIAGNOSIS — M47816 Spondylosis without myelopathy or radiculopathy, lumbar region: Secondary | ICD-10-CM | POA: Diagnosis not present

## 2020-12-14 MED ORDER — DULOXETINE HCL 20 MG PO CPEP: 20.0000 mg | ORAL_CAPSULE | Freq: Every day | ORAL | 1 refills | Status: DC

## 2020-12-14 NOTE — Progress Notes (Signed)
Subjective:    Patient ID: Dawn Lucas, female    DOB: 1955-12-07, 65 y.o.   MRN: [id number] Referred by PCP Dr Tamala Julian  HPI  CC:  Low back pain with RIght hip pain  65 yo female withFM, HTN, allergies, OSA on CPAP,morbid  obesity , has chronic widespread pain involving shoulders, hands , knees and feet.  Worst  Pain is in the lower back, no hx of back surgery.  Was told she had bulging discs but has not had pain shooting down to her feet.  The patient has been evaluated by rheumatology several years ago and diagnosed with fibromyalgia syndrome.  The back pain is worsened by standing or leaning backwards.  It improves with leaning forwards.  The patient has had no recent falls or trauma.  In regards to her hip pain she has had x-rays which were unremarkable.  The patient has had MRI of the lumbar spine in 2013 showing facet arthropathy L4-5 L5-S1 but no significant spinal stenosis at that time.  Lumbar x-rays in 2019 also showed lumbar spondylosis but no other significant abnormalities.  Pt has been to PT, several years ago for her back pain before she was seen by Dr Estanislado Pandy, last seen in 2020.  At that time she was doing well and taking baclofen 10 mg as needed very sparingly as well as taking her Mobic 15 mg a day Has been treated by pregabalin, topiramate, and tramadol, in general does not like taking meds  Currently is on Lyrica 200mg  po BID as well as mobic 15mg    Pt mod I for ADLs and mobility without AD Does not drive (never has) was too nervous to learn  Pain Inventory Average Pain 8 Pain Right Now 6 My pain is sharp, stabbing and aching  In the last 24 hours, has pain interfered with the following? General activity 7 Relation with others 7 Enjoyment of life 7 What TIME of day is your pain at its worst? night Sleep (in general) Fair  Pain is worse with: walking, sitting and standing Pain improves with: rest Relief from Meds: 5  walk without assistance ability to climb  steps?  yes do you drive?  no  not employed: date last employed .  bladder control problems trouble walking spasms dizziness  Any changes since last visit?  no  Primary care Dr Dustin Folks    Family History  Problem Relation Age of Onset  . Colon cancer Mother   . Diabetes type II Maternal Grandmother   . Diabetes type II Paternal Grandmother   . Heart attack Sister   . HIV Brother   . Cirrhosis Brother   . Diabetes Sister   . HIV Brother    Social History   Socioeconomic History  . Marital status: Single    Spouse name: Not on file  . Number of children: 1  . Years of education: Not on file  . Highest education level: 10th grade  Occupational History  . Occupation: disabled  Tobacco Use  . Smoking status: Current Every Day Smoker    Packs/day: 0.50    Years: 50.00    Pack years: 25.00    Types: Cigarettes  . Smokeless tobacco: Former Systems developer    Types: Snuff, Chew  Vaping Use  . Vaping Use: Never used  Substance and Sexual Activity  . Alcohol use: Yes    Comment: rarely   . Drug use: No  . Sexual activity: Yes    Birth control/protection: Condom, Post-menopausal  Other Topics Concern  . Not on file  Social History Narrative   Patient is right-handed. She lives alone.She drinks 3-4 cups of coffee a week, and green tea most days.   Social Determinants of Health   Financial Resource Strain: Not on file  Food Insecurity: Not on file  Transportation Needs: Not on file  Physical Activity: Not on file  Stress: Not on file  Social Connections: Not on file   Past Surgical History:  Procedure Laterality Date  . CHOLECYSTECTOMY    . COLON SURGERY    . DILATION AND CURETTAGE OF UTERUS     MAB x 4  . HYSTEROSCOPY WITH D & C N/A 02/23/2018   Procedure: DILATATION AND CURETTAGE /HYSTEROSCOPY;  Surgeon: Mora Bellman, MD;  Location: [facility];  Service: Gynecology;  Laterality: N/A;  . OVARIAN CYST REMOVAL     Past Medical History:  Diagnosis Date  . Anemia    . Arthritis    hands, knees  . Asthma   . Back pain   . Fibromyalgia   . Headache   . Hyperlipidemia   . Hypertension 11/04/2011  . Poor dental hygiene    missing and chipped teeth  . Sleep apnea    Uses CPAP nightly  . SVD (spontaneous vaginal delivery)    x 1  . UTI (lower urinary tract infection)    BP (!) 145/77   Pulse 79   Temp 98.5 F (36.9 C)   Ht 5\' 2"  (1.575 m)   Wt 240 lb (108.9 kg)   SpO2 99%   BMI 43.90 kg/m   Opioid Risk Score:   Fall Risk Score:  `1  Depression screen PHQ 2/9  Depression screen [facility] 2/9 12/14/2020 03/10/2019 11/02/2018 08/23/2015 04/25/2015 04/07/2015 09/16/2014  Decreased Interest 0 0 0 0 0 0 0  Down, Depressed, Hopeless 0 0 2 0 0 0 0  PHQ - 2 Score 0 0 2 0 0 0 0  Altered sleeping 1 0 3 - - - -  Tired, decreased energy 1 0 3 - - - -  Change in appetite 3 0 3 - - - -  Feeling bad or failure about yourself  0 0 0 - - - -  Trouble concentrating 0 0 0 - - - -  Moving slowly or fidgety/restless 0 0 0 - - - -  Suicidal thoughts 0 0 0 - - - -  PHQ-9 Score 5 0 11 - - - -  Difficult doing work/chores Somewhat difficult Not difficult at all - - - - -     Review of Systems  Constitutional: Positive for diaphoresis and unexpected weight change.       Gain  HENT: Negative.   Eyes: Negative.   Respiratory: Positive for apnea and shortness of breath.   Cardiovascular: Positive for leg swelling.       Reports heart failure --sees [facility]  Gastrointestinal: Positive for abdominal pain, nausea and vomiting.  Endocrine:       High blood sugars  Genitourinary:       Bladder control.  Musculoskeletal: Positive for arthralgias, back pain, gait problem and myalgias.       Shoulder pain/knee pain hand pain  Hx gout, spasms  Skin: Negative.   Allergic/Immunologic: Negative.   Neurological: Positive for dizziness.  Hematological: Negative.   Psychiatric/Behavioral: Negative.   All other systems reviewed and are negative.      Objective:    Physical Exam Vitals and nursing note reviewed.  Constitutional:      Appearance: She is obese.  HENT:     Head: Normocephalic and atraumatic.  Eyes:     Extraocular Movements: Extraocular movements intact.     Conjunctiva/sclera: Conjunctivae normal.     Pupils: Pupils are equal, round, and reactive to light.  Cardiovascular:     Rate and Rhythm: Normal rate and regular rhythm.     Heart sounds: Normal heart sounds.  Pulmonary:     Effort: Pulmonary effort is normal. No respiratory distress.     Breath sounds: Normal breath sounds.  Abdominal:     General: Abdomen is flat. Bowel sounds are normal. There is no distension.     Palpations: Abdomen is soft.     Tenderness: There is no abdominal tenderness.  Musculoskeletal:        General: No swelling or tenderness.     Right lower leg: Edema present.     Left lower leg: Edema present.     Right foot: Normal range of motion.     Left foot: Normal range of motion.     Comments: 1+ edema bilateral lower extremities The patient has full range of motion bilateral shoulders hips knees ankles Lumbar spine has pain with lumbar extension and approximately 50% of normal range lumbar flexion is at 75% of normal without pain. Negative straight leg raising bilaterally. Ambulates without assistive device no evidence of toe drag or knee instability.  Feet:     Right foot:     Protective Sensation: 2 sites tested.     Skin integrity: Skin integrity normal.     Toenail Condition: Right toenails are normal.     Left foot:     Protective Sensation: 2 sites tested.     Skin integrity: Skin integrity normal.     Toenail Condition: Left toenails are normal.  Neurological:     Mental Status: She is alert and oriented to person, place, and time.     Cranial Nerves: No dysarthria.     Sensory: Sensation is intact.     Motor: Motor function is intact. No tremor or abnormal muscle tone.     Coordination: Coordination is intact.     Gait: Gait is  intact.     Comments: 5/5 strength bilateral deltoid bicep tricep grip hip flexion knee extension ankle dorsiflexion plantarflexion Sensation intact to light touch bilateral lower limbs           Assessment & Plan:  #1.  History of chronic low back pain with radiographic evidence as well as physical exam evidence consistent with lumbar spondylosis.  At this time her fibromyalgia appears to be too active to tolerate lumbar injections.  Once pain improves, would recommend medial branch blocks to further evaluate and if these really patent pain by at least 50% on 2 occasions, radiofrequency neurotomy would be the next step.  Would plan to perform injections on L3 and L4 medial branch and L5 dorsal ramus.  We will do bilaterally  #2.  Fibromyalgia syndrome she does have widespread body pain with even light palpation.  She has tried pregabalin without much benefit will trial duloxetine.  We will start at 20 mg/day and gradually increase.  If she is doing better next time I see her in 6 weeks would consider reducing her pregabalin at that time

## 2020-12-14 NOTE — Patient Instructions (Signed)
Duloxetine Delayed-Release Capsules What is this medicine? DULOXETINE (doo LOX e teen) is used to treat depression, anxiety, and different types of chronic pain. This medicine may be used for other purposes; ask your health care provider or pharmacist if you have questions. COMMON BRAND NAME(S): Cymbalta, Creig Hines, Irenka What should I tell my health care provider before I take this medicine? They need to know if you have any of these conditions:  bipolar disorder  glaucoma  high blood pressure  kidney disease  liver disease  seizures  suicidal thoughts, plans or attempt; a previous suicide attempt by you or a family member  take medicines that treat or prevent blood clots  taken medicines called MAOIs like Carbex, Eldepryl, Marplan, Nardil, and Parnate within 14 days  trouble passing urine  an unusual reaction to duloxetine, other medicines, foods, dyes, or preservatives  pregnant or trying to get pregnant  breast-feeding How should I use this medicine? Take this medicine by mouth with a glass of water. Follow the directions on the prescription label. Do not crush, cut or chew some capsules of this medicine. Some capsules may be opened and sprinkled on applesauce. Check with your doctor or pharmacist if you are not sure. You can take this medicine with or without food. Take your medicine at regular intervals. Do not take your medicine more often than directed. Do not stop taking this medicine suddenly except upon the advice of your doctor. Stopping this medicine too quickly may cause serious side effects or your condition may worsen. A special MedGuide will be given to you by the pharmacist with each prescription and refill. Be sure to read this information carefully each time. Talk to your pediatrician regarding the use of this medicine in children. While this drug may be prescribed for children as young as 55 years of age for selected conditions, precautions do  apply. Overdosage: If you think you have taken too much of this medicine contact a poison control center or emergency room at once. NOTE: This medicine is only for you. Do not share this medicine with others. What if I miss a dose? If you miss a dose, take it as soon as you can. If it is almost time for your next dose, take only that dose. Do not take double or extra doses. What may interact with this medicine? Do not take this medicine with any of the following medications:  desvenlafaxine  levomilnacipran  linezolid  MAOIs like Carbex, Eldepryl, Emsam, Marplan, Nardil, and Parnate  methylene blue (injected into a vein)  milnacipran  safinamide  thioridazine  venlafaxine  viloxazine This medicine may also interact with the following medications:  alcohol  amphetamines  aspirin and aspirin-like medicines  certain antibiotics like ciprofloxacin and enoxacin  certain medicines for blood pressure, heart disease, irregular heart beat  certain medicines for depression, anxiety, or psychotic disturbances  certain medicines for migraine headache like almotriptan, eletriptan, frovatriptan, naratriptan, rizatriptan, sumatriptan, zolmitriptan  certain medicines that treat or prevent blood clots like warfarin, enoxaparin, and dalteparin  cimetidine  fentanyl  lithium  NSAIDS, medicines for pain and inflammation, like ibuprofen or naproxen  phentermine  procarbazine  rasagiline  sibutramine  St. John's wort  theophylline  tramadol  tryptophan This list may not describe all possible interactions. Give your health care provider a list of all the medicines, herbs, non-prescription drugs, or dietary supplements you use. Also tell them if you smoke, drink alcohol, or use illegal drugs. Some items may interact with your medicine. What  should I watch for while using this medicine? Tell your doctor if your symptoms do not get better or if they get worse. Visit your  doctor or healthcare provider for regular checks on your progress. Because it may take several weeks to see the full effects of this medicine, it is important to continue your treatment as prescribed by your doctor. This medicine may cause serious skin reactions. They can happen weeks to months after starting the medicine. Contact your healthcare provider right away if you notice fevers or flu-like symptoms with a rash. The rash may be red or purple and then turn into blisters or peeling of the skin. Or, you might notice a red rash with swelling of the face, lips, or lymph nodes in your neck or under your arms. Patients and their families should watch out for new or worsening thoughts of suicide or depression. Also watch out for sudden changes in feelings such as feeling anxious, agitated, panicky, irritable, hostile, aggressive, impulsive, severely restless, overly excited and hyperactive, or not being able to sleep. If this happens, especially at the beginning of treatment or after a change in dose, call your healthcare provider. You may get drowsy or dizzy. Do not drive, use machinery, or do anything that needs mental alertness until you know how this medicine affects you. Do not stand or sit up quickly, especially if you are an older patient. This reduces the risk of dizzy or fainting spells. Alcohol may interfere with the effect of this medicine. Avoid alcoholic drinks. This medicine can cause an increase in blood pressure. This medicine can also cause a sudden drop in your blood pressure, which may make you feel faint and increase the chance of a fall. These effects are most common when you first start the medicine or when the dose is increased, or during use of other medicines that can cause a sudden drop in blood pressure. Check with your doctor for instructions on monitoring your blood pressure while taking this medicine. Your mouth may get dry. Chewing sugarless gum or sucking hard candy, and drinking  plenty of water, may help. Contact your doctor if the problem does not go away or is severe. What side effects may I notice from receiving this medicine? Side effects that you should report to your doctor or health care professional as soon as possible:  allergic reactions like skin rash, itching or hives, swelling of the face, lips, or tongue  anxious  breathing problems  confusion  changes in vision  chest pain  confusion  elevated mood, decreased need for sleep, racing thoughts, impulsive behavior  eye pain  fast, irregular heartbeat  feeling faint or lightheaded, falls  feeling agitated, angry, or irritable  hallucination, loss of contact with reality  high blood pressure  loss of balance or coordination  palpitations  redness, blistering, peeling or loosening of the skin, including inside the mouth  restlessness, pacing, inability to keep still  seizures  stiff muscles  suicidal thoughts or other mood changes  trouble passing urine or change in the amount of urine  trouble sleeping  unusual bleeding or bruising  unusually weak or tired  vomiting  yellowing of the eyes or skin Side effects that usually do not require medical attention (report to your doctor or health care professional if they continue or are bothersome):  change in sex drive or performance  change in appetite or weight  constipation  dizziness  dry mouth  headache  increased sweating  nausea  tired  This list may not describe all possible side effects. Call your doctor for medical advice about side effects. You may report side effects to FDA at 1-800-FDA-1088. Where should I keep my medicine? Keep out of the reach of children and pets. Store at room temperature between 15 and 30 degrees C (59 to 86 degrees F). Get rid of any unused medicine after the expiration date. To get rid of medicines that are no longer needed or have expired:  Take the medicine to a medicine  take-back program. Check with your pharmacy or law enforcement to find a location.  If you cannot return the medicine, check the label or package insert to see if the medicine should be thrown out in the garbage or flushed down the toilet. If you are not sure, ask your health care provider. If it is safe to put it in the trash, take the medicine out of the container. Mix the medicine with cat litter, dirt, coffee grounds, or other unwanted substance. Seal the mixture in a bag or container. Put it in the trash. NOTE: This sheet is a summary. It may not cover all possible information. If you have questions about this medicine, talk to your doctor, pharmacist, or health care provider.  2021 Elsevier/Gold Standard (2020-08-10 16:06:16)

## 2020-12-18 ENCOUNTER — Ambulatory Visit (INDEPENDENT_AMBULATORY_CARE_PROVIDER_SITE_OTHER): Payer: Medicare Other | Admitting: Interventional Cardiology

## 2020-12-18 ENCOUNTER — Encounter: Payer: Self-pay | Admitting: Interventional Cardiology

## 2020-12-18 ENCOUNTER — Other Ambulatory Visit: Payer: Self-pay

## 2020-12-18 VITALS — BP 120/80 | HR 58 | Ht 62.0 in | Wt 236.0 lb

## 2020-12-18 DIAGNOSIS — R0609 Other forms of dyspnea: Secondary | ICD-10-CM

## 2020-12-18 DIAGNOSIS — Z720 Tobacco use: Secondary | ICD-10-CM

## 2020-12-18 DIAGNOSIS — G4733 Obstructive sleep apnea (adult) (pediatric): Secondary | ICD-10-CM | POA: Diagnosis not present

## 2020-12-18 DIAGNOSIS — R072 Precordial pain: Secondary | ICD-10-CM

## 2020-12-18 DIAGNOSIS — R0600 Dyspnea, unspecified: Secondary | ICD-10-CM

## 2020-12-18 NOTE — Patient Instructions (Addendum)
Medication Instructions:  Your physician has recommended you make the following change in your medication: Stop hydrochlorothiazide  *If you need a refill on your cardiac medications before your next appointment, please call your pharmacy*   Lab Work: Lab work to be done today--BMP If you have labs (blood work) drawn today and your tests are completely normal, you will receive your results only by: Marland Kitchen MyChart Message (if you have MyChart) OR . A paper copy in the mail If you have any lab test that is abnormal or we need to change your treatment, we will call you to review the results.   Testing/Procedures: Your physician has requested that you have cardiac CT. Cardiac computed tomography (CT) is a painless test that uses an x-ray machine to take clear, detailed pictures of your heart. For further information please visit [email or web address]. Please follow instruction sheet as given.      Follow-Up: At [facility], you and your health needs are our priority.  As part of our continuing mission to provide you with exceptional heart care, we have created designated Provider Care Teams.  These Care Teams include your primary Cardiologist (physician) and Advanced Practice Providers (APPs -  Physician Assistants and Nurse Practitioners) who all work together to provide you with the care you need, when you need it.  We recommend signing up for the patient portal called "MyChart".  Sign up information is provided on this After Visit Summary.  MyChart is used to connect with patients for Virtual Visits (Telemedicine).  Patients are able to view lab/test results, encounter notes, upcoming appointments, etc.  Non-urgent messages can be sent to your provider as well.   To learn more about what you can do with MyChart, go to [email or web address].    Your next appointment:   Based on CT results  The format for your next appointment:   In Person  Provider:   You may see Larae Grooms, MD  or one of the following Advanced Practice Providers on your designated Care Team:    Melina Copa, PA-C  Ermalinda Barrios, PA-C    Other Instructions Your cardiac CT will be scheduled at one of the below locations:   [facility] [address] [place], [place] [address] [phone]  [place] [address] [place], [place] [address] [phone]  If scheduled at [facility], please arrive at the [facility] main entrance (entrance A) of [facility] 30 minutes prior to test start time. Proceed to the [facility] Radiology Department (first floor) to check-in and test prep.  If scheduled at [facility], please arrive 15 mins early for check-in and test prep.    On the Night Before the Test: . Be sure to Drink plenty of water. . Do not consume any caffeinated/decaffeinated beverages or chocolate 12 hours prior to your test. . Do not take any antihistamines 12 hours prior to your test.  On the Day of the Test: . Drink plenty of water until 1 hour prior to the test. . Do not eat any food 4 hours prior to the test. . You may take your regular medications prior to the test.  . HOLD Furosemide/Hydrochlorothiazide morning of the test. . FEMALES- please wear underwire-free bra if available          After the Test: . Drink plenty of water. . After receiving IV contrast, you may experience a mild flushed feeling. This is normal. . On  occasion, you may experience a mild rash up to 24 hours after the test. This is not dangerous. If this occurs, you can take Benadryl 25 mg and increase your fluid intake. . If you experience trouble breathing, this can be serious. If it is severe call 911 IMMEDIATELY. If it is mild, please call our office. . If you take any of these medications: Glipizide/Metformin, Avandament, Glucavance, please do not take 48 hours after completing test unless  otherwise instructed.   Once we have confirmed authorization from your insurance company, we will call you to set up a date and time for your test. Based on how quickly your insurance processes prior authorizations requests, please allow up to 4 weeks to be contacted for scheduling your Cardiac CT appointment. Be advised that routine Cardiac CT appointments could be scheduled as many as 8 weeks after your provider has ordered it.  For non-scheduling related questions, please contact the cardiac imaging nurse navigator should you have any questions/concerns: Marchia Bond, Cardiac Imaging Nurse Navigator Gordy Clement, Cardiac Imaging Nurse Navigator [facility] Direct Office Dial: [phone]   For scheduling needs, including cancellations and rescheduling, please call [place], [phone].

## 2020-12-18 NOTE — Progress Notes (Signed)
Cardiology Office Note   Date:  12/18/2020   ID:  Jolie Strohecker, DOB 11-Nov-1955, MRN [id number]  PCP:  Sonia Side., FNP    No chief complaint on file.  DOE  [facility] Readings from Last 3 Encounters:  12/18/20 236 lb (107 kg)  12/14/20 240 lb (108.9 kg)  04/25/20 234 lb 6.4 oz (106.3 kg)       History of Present Illness: Dawn Lucas is a 65 y.o. female who is being seen today for the evaluation of syncope, DOE, LE edema at the request of Sonia Side., FNP.  DOE has been chronic with asthma for many years. She continues to smoke.  Getting worse.  She had a syncopal episode while urinating in 12/21.  No further syncope.  Occasional chest pain as well.   She feels tired.    Echo in 12/21: "Left ventricular ejection fraction, by estimation, is 50 to 55%. The  left ventricle has low normal function. The left ventricle has no regional  wall motion abnormalities. There is mild left ventricular hypertrophy of  the basal-septal segment. Left  ventricular diastolic parameters are consistent with Grade I diastolic  dysfunction (impaired relaxation). Elevated left ventricular end-diastolic  pressure. The average left ventricular global longitudinal strain is -14.9  %. The global longitudinal strain  is abnormal.  2. Right ventricular systolic function is normal. The right ventricular  size is normal. There is normal pulmonary artery systolic pressure. The  estimated right ventricular systolic pressure is 40.9 mmHg.  3. The mitral valve is normal in structure. Mild mitral valve  regurgitation. No evidence of mitral stenosis.  4. The aortic valve is tricuspid. Aortic valve regurgitation is trivial.  Mild aortic valve sclerosis is present, with no evidence of aortic valve  stenosis.  5. The inferior vena cava is normal in size with greater than 50%  respiratory variability, suggesting right atrial pressure of 3 mmHg. "  Denies :  Dizziness. Leg edema. Nitroglycerin  use. Orthopnea. Palpitations. Paroxysmal nocturnal dyspnea.  Syncope.     Past Medical History:  Diagnosis Date  . Anemia   . Arthritis    hands, knees  . Asthma   . Back pain   . Fibromyalgia   . Headache   . Hyperlipidemia   . Hypertension 11/04/2011  . Poor dental hygiene    missing and chipped teeth  . Sleep apnea    Uses CPAP nightly  . SVD (spontaneous vaginal delivery)    x 1  . UTI (lower urinary tract infection)     Past Surgical History:  Procedure Laterality Date  . CHOLECYSTECTOMY    . COLON SURGERY    . DILATION AND CURETTAGE OF UTERUS     MAB x 4  . HYSTEROSCOPY WITH D & C N/A 02/23/2018   Procedure: DILATATION AND CURETTAGE /HYSTEROSCOPY;  Surgeon: Mora Bellman, MD;  Location: [facility];  Service: Gynecology;  Laterality: N/A;  . OVARIAN CYST REMOVAL       Current Outpatient Medications  Medication Sig Dispense Refill  . albuterol (VENTOLIN HFA) 108 (90 Base) MCG/ACT inhaler Inhale 2 puffs into the lungs every 6 (six) hours as needed for wheezing or shortness of breath. 18 g 2  . Ascorbic Acid (VITAMIN C) 125 MG CHEW Chew 1 tablet by mouth daily.    Marland Kitchen atorvastatin (LIPITOR) 20 MG tablet Take 20 mg by mouth daily.    . calcium-vitamin D (OSCAL WITH D) 500-200 MG-UNIT tablet Take 1 tablet by  mouth.    . Cholecalciferol (VITAMIN D3) 25 MCG (1000 UT) CAPS Take 1 capsule by mouth daily.    . colchicine 0.6 MG tablet Take 0.6 mg by mouth 2 (two) times daily. As needed for gout flare    . cromolyn (OPTICROM) 4 % ophthalmic solution Place 1 drop into both eyes daily.     . DULoxetine (CYMBALTA) 20 MG capsule Take 1 capsule (20 mg total) by mouth daily. 30 capsule 1  . furosemide (LASIX) 40 MG tablet Take 40 mg by mouth daily.    Marland Kitchen glipiZIDE (GLUCOTROL) 10 MG tablet Take 10 mg by mouth daily.    Marland Kitchen losartan (COZAAR) 50 MG tablet Take 50 mg by mouth daily.    . meloxicam (MOBIC) 15 MG tablet Take 15 mg by mouth daily.    . montelukast (SINGULAIR) 10 MG tablet Take  10 mg by mouth daily.    . pantoprazole (PROTONIX) 20 MG tablet Take 1 tablet by mouth daily.    . pregabalin (LYRICA) 200 MG capsule Take 200 mg by mouth 2 (two) times daily.    . Probiotic Product (PROBIOTIC-10 PO) Take by mouth.    . SYMBICORT 80-4.5 MCG/ACT inhaler Inhale 2 puffs into the lungs 2 (two) times daily.     No current facility-administered medications for this visit.    Allergies:   Quinolones and Sulfa antibiotics    Social History:  The patient  reports that she has been smoking cigarettes. She has a 25.00 pack-year smoking history. She has quit using smokeless tobacco.  Her smokeless tobacco use included snuff and chew. She reports current alcohol use. She reports that she does not use drugs.   Family History:  [name] family history includes Cirrhosis in her brother; Colon cancer in her mother; Diabetes in her sister; Diabetes type II in her maternal grandmother and paternal grandmother; HIV in her brother and brother; Heart attack in her sister.    ROS:  Please see the history of present illness.   Otherwise, review of systems are positive for DOE, arm pain.   All other systems are reviewed and negative.    PHYSICAL EXAM: VS:  BP 120/80 (BP Location: Left Arm, Patient Position: Sitting, Cuff Size: Normal)   Pulse (!) 58   Ht 5\' 2"  (1.575 m)   Wt 236 lb (107 kg)   SpO2 96%   BMI 43.16 kg/m  , BMI Body mass index is 43.16 kg/m. GEN: Well nourished, well developed, in no acute distress  HEENT: normal  Neck: no JVD, carotid bruits, or masses Cardiac: RRR; no murmurs, rubs, or gallops,no edema  Respiratory:  clear to auscultation bilaterally, normal work of breathing GI: soft, nontender, nondistended, + BS, obese MS: no deformity or atrophy  Skin: warm and dry, no rash Neuro:  Strength and sensation are intact Psych: euthymic mood, full affect   EKG:   The ekg ordered today demonstrates NSR, no ST changes   Recent Labs: No results found for  requested labs within last 8760 hours.   Lipid Panel    Component Value Date/Time   CHOL 223 (H) 12/04/2018 1019   TRIG 98 12/04/2018 1019   HDL 44 12/04/2018 1019   CHOLHDL 5.1 (H) 12/04/2018 1019   LDLCALC 159 (H) 12/04/2018 1019   LDLDIRECT 160 (H) 05/02/2015 4917     Other studies Reviewed: Additional studies/ records that were reviewed today with results demonstrating: 11/21 Cr 1.1.   ASSESSMENT AND PLAN:  1. HTN: controlled today.  WOuld consider stopping HCTZ since she is on Lasix to avoid hyponatremia. 2. DOE/CP: Plan for CTA of coronaries to check for CAD.  No metoprolol since HR was 58 on ECG today.  3. OSA: uses CPAP.  4. SOme deconditioning in the setting of obesity.  She feels her fibromyalgia limits her activity.  5. Tobacco abuse:  Failed with nicotine patch.  I stressed the importance of stopping smoking. 6. Whole food, plant based diet.   Current medicines are reviewed at length with the patient today.  The patient concerns regarding her medicines were addressed.  The following changes have been made:  No change  Labs/ tests ordered today include:   Orders Placed This Encounter  Procedures  . CT CORONARY MORPH W/CTA COR W/SCORE W/CA W/CM &/OR WO/CM  . Basic Metabolic Panel (BMET)  . EKG 12-Lead    Recommend 150 minutes/week of aerobic exercise Low fat, low carb, high fiber diet recommended  Disposition:   FU based on CT scan results.    Signed, Larae Grooms, MD  12/18/2020 3:15 PM    [facility] [address], [place], [place]  [address] Phone: [phone]; Fax: [phone]

## 2020-12-19 LAB — BASIC METABOLIC PANEL
BUN/Creatinine Ratio: 8 — ABNORMAL LOW (ref 12–28)
BUN: 10 mg/dL (ref 8–27)
CO2: 23 mmol/L (ref 20–29)
Calcium: 9.1 mg/dL (ref 8.7–10.3)
Chloride: 101 mmol/L (ref 96–106)
Creatinine, Ser: 1.27 mg/dL — ABNORMAL HIGH (ref 0.57–1.00)
Glucose: 89 mg/dL (ref 65–99)
Potassium: 4.1 mmol/L (ref 3.5–5.2)
Sodium: 141 mmol/L (ref 134–144)
eGFR: 47 mL/min/{1.73_m2} — ABNORMAL LOW (ref >59)

## 2021-01-05 ENCOUNTER — Telehealth (HOSPITAL_COMMUNITY): Payer: Self-pay | Admitting: Emergency Medicine

## 2021-01-05 NOTE — Telephone Encounter (Signed)
no answering service  Marchia Bond RN Navigator Cardiac Imaging [facility] [phone] Office  [phone] Cell

## 2021-01-08 ENCOUNTER — Ambulatory Visit (HOSPITAL_COMMUNITY)
Admission: RE | Admit: 2021-01-08 | Discharge: 2021-01-08 | Disposition: A | Discharge status: Home / Self Care | Payer: Medicare Other | Source: Ambulatory Visit | Attending: Interventional Cardiology | Admitting: Interventional Cardiology

## 2021-01-08 ENCOUNTER — Encounter (HOSPITAL_COMMUNITY): Payer: Self-pay

## 2021-01-08 ENCOUNTER — Other Ambulatory Visit: Payer: Self-pay

## 2021-01-08 DIAGNOSIS — Z006 Encounter for examination for normal comparison and control in clinical research program: Secondary | ICD-10-CM

## 2021-01-08 DIAGNOSIS — R072 Precordial pain: Secondary | ICD-10-CM | POA: Diagnosis not present

## 2021-01-08 MED ORDER — NITROGLYCERIN 0.4 MG SL SUBL
SUBLINGUAL_TABLET | SUBLINGUAL | Status: AC
  Filled 2021-01-08: qty 2

## 2021-01-08 MED ORDER — NITROGLYCERIN 0.4 MG SL SUBL
0.8000 mg | SUBLINGUAL_TABLET | Freq: Once | SUBLINGUAL | Status: AC
  Administered 2021-01-08: 0.8 mg via SUBLINGUAL

## 2021-01-08 MED ORDER — IOHEXOL 350 MG/ML SOLN
80.0000 mL | Freq: Once | INTRAVENOUS | Status: AC | PRN
  Administered 2021-01-08: 80 mL via INTRAVENOUS

## 2021-01-08 NOTE — Research (Signed)
IDENTIFY Informed Consent                  Subject Name: [name]    Subject met inclusion and exclusion criteria.  The informed consent form, study requirements and expectations were reviewed with the subject and questions and concerns were addressed prior to the signing of the consent form.  The subject verbalized understanding of the trial requirements.  The subject agreed to participate in the IDENTIFY trial and signed the informed consent at 13:17PM on 01/08/21.  The informed consent was obtained prior to performance of any protocol-specific procedures for the subject.  A copy of the signed informed consent was given to the subject and a copy was placed in the subject's medical record.   Meade Maw , Naval architect

## 2021-01-10 ENCOUNTER — Telehealth: Payer: Self-pay | Admitting: *Deleted

## 2021-01-10 ENCOUNTER — Encounter (HOSPITAL_COMMUNITY): Payer: Self-pay | Admitting: Emergency Medicine

## 2021-01-10 ENCOUNTER — Other Ambulatory Visit: Payer: Self-pay

## 2021-01-10 ENCOUNTER — Emergency Department (HOSPITAL_COMMUNITY)
Admission: EM | Admit: 2021-01-10 | Discharge: 2021-01-10 | Disposition: A | Discharge status: Home / Self Care | Payer: Medicare Other | Attending: Emergency Medicine | Admitting: Emergency Medicine

## 2021-01-10 DIAGNOSIS — I10 Essential (primary) hypertension: Secondary | ICD-10-CM | POA: Diagnosis not present

## 2021-01-10 DIAGNOSIS — T7840XA Allergy, unspecified, initial encounter: Secondary | ICD-10-CM | POA: Insufficient documentation

## 2021-01-10 DIAGNOSIS — F17210 Nicotine dependence, cigarettes, uncomplicated: Secondary | ICD-10-CM | POA: Insufficient documentation

## 2021-01-10 DIAGNOSIS — R220 Localized swelling, mass and lump, head: Secondary | ICD-10-CM | POA: Diagnosis not present

## 2021-01-10 DIAGNOSIS — Z79899 Other long term (current) drug therapy: Secondary | ICD-10-CM | POA: Insufficient documentation

## 2021-01-10 DIAGNOSIS — J45909 Unspecified asthma, uncomplicated: Secondary | ICD-10-CM | POA: Diagnosis not present

## 2021-01-10 MED ORDER — FAMOTIDINE 20 MG PO TABS: 20.0000 mg | ORAL_TABLET | Freq: Two times a day (BID) | ORAL | 0 refills | Status: DC

## 2021-01-10 MED ORDER — DIPHENHYDRAMINE HCL 25 MG PO TABS: 25.0000 mg | ORAL_TABLET | Freq: Three times a day (TID) | ORAL | 0 refills | Status: DC

## 2021-01-10 MED ORDER — PREDNISONE 20 MG PO TABS: 40.0000 mg | ORAL_TABLET | Freq: Every day | ORAL | 0 refills | Status: DC

## 2021-01-10 MED ORDER — DIPHENHYDRAMINE HCL 25 MG PO CAPS
25.0000 mg | ORAL_CAPSULE | Freq: Once | ORAL | Status: AC
  Administered 2021-01-10: 25 mg via ORAL
  Filled 2021-01-10: qty 1

## 2021-01-10 MED ORDER — DEXAMETHASONE SODIUM PHOSPHATE 10 MG/ML IJ SOLN
10.0000 mg | Freq: Once | INTRAMUSCULAR | Status: AC
  Administered 2021-01-10: 10 mg via INTRAMUSCULAR
  Filled 2021-01-10: qty 1

## 2021-01-10 MED ORDER — FAMOTIDINE 20 MG PO TABS
20.0000 mg | ORAL_TABLET | Freq: Once | ORAL | Status: AC
  Administered 2021-01-10: 20 mg via ORAL
  Filled 2021-01-10: qty 1

## 2021-01-10 NOTE — Telephone Encounter (Signed)
-----   Message from Jettie Booze, MD sent at 01/09/2021  5:28 PM EDT ----- No CAD.  Continue healthy diet and regular exercise to prevent plaque buildup.

## 2021-01-10 NOTE — ED Provider Notes (Signed)
[facility] EMERGENCY DEPARTMENT Provider Note   CSN: [id number] Arrival date & time: 01/10/21  1519     History Chief Complaint  Patient presents with  . Allergic Reaction    Dawn Lucas is a 65 y.o. female.  HPI Patient presents 2 days after receiving contrast dye for CT coronary artery study, now with concern for facial swelling. She notes that she had some itchiness after the event, but did not have facial swelling till yesterday.  She has been taking Benadryl, at least once a day for the past 3 days, but in spite of this has developed worsening facial swelling diffusely, without substantial new dyspnea, without syncope, without vomiting, without pain. She spoke with her physician and was sent here for evaluation, concern of allergic reaction.    Past Medical History:  Diagnosis Date  . Anemia   . Arthritis    hands, knees  . Asthma   . Back pain   . Fibromyalgia   . Headache   . Hyperlipidemia   . Hypertension 11/04/2011  . Poor dental hygiene    missing and chipped teeth  . Sleep apnea    Uses CPAP nightly  . SVD (spontaneous vaginal delivery)    x 1  . UTI (lower urinary tract infection)     Patient Active Problem List   Diagnosis Date Noted  . Thickened endometrium   . Prediabetes 04/28/2015  . Pes planus of both feet 03/28/2015  . Seborrheic keratoses 08/22/2014  . Headache(784.0) 05/17/2014  . Lower extremity edema 09/22/2013  . Vitamin D deficiency 09/22/2013  . Tendinitis of left rotator cuff 06/17/2013  . Right ankle pain 06/17/2013  . Colon cancer screening 06/17/2013  . Knee pain, bilateral 03/20/2012  . Hypertension 11/04/2011  . Generalized pain 10/15/2011  . Back pain 02/08/2011  . Obese 02/08/2011  . Tobacco abuse 02/08/2011  . Sleep disorder 02/08/2011    Past Surgical History:  Procedure Laterality Date  . CHOLECYSTECTOMY    . COLON SURGERY    . DILATION AND CURETTAGE OF UTERUS     MAB x 4  . HYSTEROSCOPY  WITH D & C N/A 02/23/2018   Procedure: DILATATION AND CURETTAGE /HYSTEROSCOPY;  Surgeon: Mora Bellman, MD;  Location: [facility];  Service: Gynecology;  Laterality: N/A;  . OVARIAN CYST REMOVAL       OB History    Gravida  5   Para  1   Term  1   Preterm      AB  4   Living  1     SAB  4   IAB      Ectopic      Multiple      Live Births  1           Family History  Problem Relation Age of Onset  . Colon cancer Mother   . Diabetes type II Maternal Grandmother   . Diabetes type II Paternal Grandmother   . Heart attack Sister   . HIV Brother   . Cirrhosis Brother   . Diabetes Sister   . HIV Brother     Social History   Tobacco Use  . Smoking status: Current Every Day Smoker    Packs/day: 0.50    Years: 50.00    Pack years: 25.00    Types: Cigarettes  . Smokeless tobacco: Former Systems developer    Types: Snuff, Chew  Vaping Use  . Vaping Use: Never used  Substance Use Topics  .  Alcohol use: Yes    Comment: rarely   . Drug use: No    Home Medications Prior to Admission medications   Medication Sig Start Date End Date Taking? Authorizing Provider  albuterol (VENTOLIN HFA) 108 (90 Base) MCG/ACT inhaler Inhale 2 puffs into the lungs every 6 (six) hours as needed for wheezing or shortness of breath. 11/03/19   Fulp, Cammie, MD  Ascorbic Acid (VITAMIN C) 125 MG CHEW Chew 1 tablet by mouth daily.    [provider]  atorvastatin (LIPITOR) 20 MG tablet Take 20 mg by mouth daily. 11/03/20   [provider]  calcium-vitamin D (OSCAL WITH D) 500-200 MG-UNIT tablet Take 1 tablet by mouth.    [provider]  Cholecalciferol (VITAMIN D3) 25 MCG (1000 UT) CAPS Take 1 capsule by mouth daily.    [provider]  colchicine 0.6 MG tablet Take 0.6 mg by mouth 2 (two) times daily. As needed for gout flare 08/15/20   [provider]  cromolyn (OPTICROM) 4 % ophthalmic solution Place 1 drop into both eyes daily.  04/06/19   [provider]  DULoxetine (CYMBALTA) 20 MG capsule Take 1 capsule (20 mg total) by mouth daily. 12/14/20   Kirsteins, Luanna Salk, MD  furosemide (LASIX) 40 MG tablet Take 40 mg by mouth daily. 10/27/20   [provider]  glipiZIDE (GLUCOTROL) 10 MG tablet Take 10 mg by mouth daily. 02/23/20   [provider]  losartan (COZAAR) 50 MG tablet Take 50 mg by mouth daily. 08/15/20   [provider]  meloxicam (MOBIC) 15 MG tablet Take 15 mg by mouth daily. 11/17/20   [provider]  montelukast (SINGULAIR) 10 MG tablet Take 10 mg by mouth daily. 11/09/20   [provider]  pantoprazole (PROTONIX) 20 MG tablet Take 1 tablet by mouth daily.    [provider]  pregabalin (LYRICA) 200 MG capsule Take 200 mg by mouth 2 (two) times daily. 08/15/20   [provider]  Probiotic Product (PROBIOTIC-10 PO) Take by mouth.    [provider]  SYMBICORT 80-4.5 MCG/ACT inhaler Inhale 2 puffs into the lungs 2 (two) times daily. 08/29/20   [provider]    Allergies    Contrast media [iodinated diagnostic agents], Quinolones, and Sulfa antibiotics  Review of Systems   Review of Systems  Constitutional:       Per HPI, otherwise negative  HENT:       Per HPI, otherwise negative  Respiratory:       Per HPI, otherwise negative  Cardiovascular:       Per HPI, otherwise negative  Gastrointestinal: Negative for vomiting.  Endocrine:       Negative aside from HPI  Genitourinary:       Neg aside from HPI   Musculoskeletal:       Per HPI, otherwise negative  Skin: Positive for color change.  Neurological: Negative for syncope.    Physical Exam Updated Vital Signs BP 138/84 (BP Location: Right Arm)   Pulse 68   Temp 98.6 F (37 C) (Oral)   Resp 20   SpO2 98%   Physical Exam Vitals and nursing note reviewed.  Constitutional:      General: She is not in acute distress.    Appearance: She is well-developed.  HENT:     Head:  Normocephalic.      Mouth/Throat:     Pharynx: Oropharynx is clear. No pharyngeal swelling, oropharyngeal exudate, posterior oropharyngeal erythema  or uvula swelling.  Eyes:     Conjunctiva/sclera: Conjunctivae normal.  Cardiovascular:     Rate and Rhythm: Normal rate and regular rhythm.  Pulmonary:     Effort: Pulmonary effort is normal. No respiratory distress.     Breath sounds: Normal breath sounds. No stridor.  Abdominal:     General: There is no distension.  Skin:    General: Skin is warm and dry.  Neurological:     Mental Status: She is alert and oriented to person, place, and time.     Cranial Nerves: No cranial nerve deficit.     ED Results / Procedures / Treatments    Procedures Procedures   Medications Ordered in ED Medications  dexamethasone (DECADRON) injection 10 mg (has no administration in time range)    ED Course  I have reviewed the triage vital signs and the nursing notes.  Pertinent labs & imaging results that were available during my care of the patient were reviewed by me and considered in my medical decision making (see chart for details).  Del female presents to UC after procedure with IV contrast dye, now with facial swelling.  No obvious rash, no oropharyngeal edema, no increased work of breathing, clear breath sounds.  Though she does have facial edema, there is no evidence for systemic effects of likely allergic reaction.  Patient amenable to initiation of steroids, with IM first dose, transition to oral, with ongoing Benadryl, Pepcid. Final Clinical Impression(s) / ED Diagnoses Final diagnoses:  Allergic reaction, initial encounter  Rx / DC Orders ED Discharge Orders         Ordered    predniSONE (DELTASONE) 20 MG tablet  Daily with breakfast        01/10/21 1551    diphenhydrAMINE (BENADRYL) 25 MG tablet  3 times daily        01/10/21 1551    famotidine (PEPCID) 20 MG tablet  2 times daily        01/10/21 1551           Carmin Muskrat, MD 01/10/21 1551

## 2021-01-10 NOTE — Telephone Encounter (Signed)
Calling patient to inquire about her allergic reaction symptoms.   Pt states she had a cardiac CTA on 01/08/21 and her rash, itching, facial swelling and neck swelling started on 01/09/21. She began taking PO benadryl yesterday and reports no improvement. She states her eyes are almost swollen shut. Pt denies CP, SOB.   I recommended that she seek care at a local ER for treatment of her symptoms. She stated she did not have a ride. I suggested she call 911. She said she did not have the money for that and that she did not want to wait in the ER waiting room.   I told her that if the PO benadryl wasn't improving her symptoms then she would need steroids or other IV medications to mitigate the allergic response. She then agreed to seek emergent medical help.  I will add contrast media/IV dye to her allergy list.  Marchia Bond RN Navigator Cardiac Imaging [facility] [phone] Office  [phone] Cell

## 2021-01-10 NOTE — ED Triage Notes (Signed)
Pt BIB [facility] from home c/o allergic reaction. States she had a procedure on Monday and was injected with contrast dye. Since then she was developed increased facial swelling. PCP recommended PO diphenhydramine without improvement. Present to ED with worsening facial swelling, throat swelling, rash, and itchiness. Pt airway intact speaking in full sentences.

## 2021-01-10 NOTE — Telephone Encounter (Signed)
I spoke with patient and reviewed CT results with her.  She had CT on 01/08/21.  On 4/5 she developed swelling in her face,eyes and ears.  Also developed itchy rash on neck, chest and arms on 4/5.  She started Benadryl for rash.  Rash and swelling continue today.  No shortness of breath.  No new medications or any other changes since procedure.

## 2021-01-10 NOTE — Discharge Instructions (Signed)
As discussed, you have been diagnosed with an allergic reaction.  In addition to the prescribed steroids, please use Benadryl, 25 mg, 3 times daily and Pepcid, 20 mg, twice daily for the next 2 days.  Return here for concerning changes in your condition.

## 2021-01-19 ENCOUNTER — Inpatient Hospital Stay: Admission: RE | Admit: 2021-01-19 | Payer: Medicare Other | Source: Ambulatory Visit

## 2021-01-25 ENCOUNTER — Encounter: Payer: Medicare Other | Admitting: Physical Medicine & Rehabilitation

## 2021-02-08 ENCOUNTER — Encounter: Payer: Self-pay | Admitting: Physical Medicine & Rehabilitation

## 2021-02-08 ENCOUNTER — Other Ambulatory Visit: Payer: Self-pay

## 2021-02-08 ENCOUNTER — Encounter: Payer: Medicare Other | Attending: Physical Medicine & Rehabilitation | Admitting: Physical Medicine & Rehabilitation

## 2021-02-08 VITALS — BP 151/90 | HR 62 | Temp 99.0°F | Ht 62.0 in | Wt 233.0 lb

## 2021-02-08 DIAGNOSIS — F331 Major depressive disorder, recurrent, moderate: Secondary | ICD-10-CM | POA: Insufficient documentation

## 2021-02-08 DIAGNOSIS — Z720 Tobacco use: Secondary | ICD-10-CM | POA: Insufficient documentation

## 2021-02-08 DIAGNOSIS — M797 Fibromyalgia: Secondary | ICD-10-CM | POA: Diagnosis not present

## 2021-02-08 DIAGNOSIS — M47816 Spondylosis without myelopathy or radiculopathy, lumbar region: Secondary | ICD-10-CM | POA: Insufficient documentation

## 2021-02-08 DIAGNOSIS — R7303 Prediabetes: Secondary | ICD-10-CM | POA: Insufficient documentation

## 2021-02-08 DIAGNOSIS — G4733 Obstructive sleep apnea (adult) (pediatric): Secondary | ICD-10-CM | POA: Insufficient documentation

## 2021-02-08 DIAGNOSIS — F068 Other specified mental disorders due to known physiological condition: Secondary | ICD-10-CM

## 2021-02-08 DIAGNOSIS — I10 Essential (primary) hypertension: Secondary | ICD-10-CM | POA: Diagnosis present

## 2021-02-08 MED ORDER — DULOXETINE HCL 30 MG PO CPEP: 30.0000 mg | ORAL_CAPSULE | Freq: Every day | ORAL | 0 refills | Status: DC

## 2021-02-08 NOTE — Progress Notes (Signed)
Subjective:    Patient ID: Dawn Lucas, female    DOB: [birth year], 65 y.o.   MRN: [id number]  HPI  65 yo pt with HTN, diabetes Iodinated contrast dye allergy , had reaction during cardiac CTA  Has had ED visit related to above  Has had poor sleep over the last month, has tried exedrin pm, sleeping only 2-4 hrs Pt has noise at her apt complex Uses CPAP, prescribed by Clint Young   Some CP and SOB , pt told by cardiology that there was no "blockages in heart"  Patient has pain and left shoulder side of neck low back area greatest in 10 interferes with activity at a moderate level sleep is fair pain is worse in the evening.  Also worse with walking standing.  Meds help a little  Pain Inventory Average Pain 8 Pain Right Now 8 My pain is sharp  In the last 24 hours, has pain interfered with the following? General activity 4 Relation with others 4 Enjoyment of life 10 What TIME of day is your pain at its worst? night Sleep (in general) Fair  Pain is worse with: walking, standing and some activites Pain improves with: other Relief from Meds: 3  Family History  Problem Relation Age of Onset  . Colon cancer Mother   . Diabetes type II Maternal Grandmother   . Diabetes type II Paternal Grandmother   . Heart attack Sister   . HIV Brother   . Cirrhosis Brother   . Diabetes Sister   . HIV Brother    Social History   Socioeconomic History  . Marital status: Single    Spouse name: Not on file  . Number of children: 1  . Years of education: Not on file  . Highest education level: 10th grade  Occupational History  . Occupation: disabled  Tobacco Use  . Smoking status: Current Every Day Smoker    Packs/day: 0.50    Years: 50.00    Pack years: 25.00    Types: Cigarettes  . Smokeless tobacco: Former Systems developer    Types: Snuff, Chew  Vaping Use  . Vaping Use: Never used  Substance and Sexual Activity  . Alcohol use: Yes    Comment: rarely   . Drug use: No  . Sexual  activity: Yes    Birth control/protection: Condom, Post-menopausal  Other Topics Concern  . Not on file  Social History Narrative   Patient is right-handed. She lives alone.She drinks 3-4 cups of coffee a week, and green tea most days.   Social Determinants of Health   Financial Resource Strain: Not on file  Food Insecurity: Not on file  Transportation Needs: Not on file  Physical Activity: Not on file  Stress: Not on file  Social Connections: Not on file   Past Surgical History:  Procedure Laterality Date  . CHOLECYSTECTOMY    . COLON SURGERY    . DILATION AND CURETTAGE OF UTERUS     MAB x 4  . HYSTEROSCOPY WITH D & C N/A 02/23/2018   Procedure: DILATATION AND CURETTAGE /HYSTEROSCOPY;  Surgeon: Mora Bellman, MD;  Location: [facility];  Service: Gynecology;  Laterality: N/A;  . OVARIAN CYST REMOVAL     Past Surgical History:  Procedure Laterality Date  . CHOLECYSTECTOMY    . COLON SURGERY    . DILATION AND CURETTAGE OF UTERUS     MAB x 4  . HYSTEROSCOPY WITH D & C N/A 02/23/2018   Procedure: DILATATION AND CURETTAGE /HYSTEROSCOPY;  Surgeon: Mora Bellman, MD;  Location: [facility];  Service: Gynecology;  Laterality: N/A;  . OVARIAN CYST REMOVAL     Past Medical History:  Diagnosis Date  . Anemia   . Arthritis    hands, knees  . Asthma   . Back pain   . Fibromyalgia   . Headache   . Hyperlipidemia   . Hypertension 11/04/2011  . Poor dental hygiene    missing and chipped teeth  . Sleep apnea    Uses CPAP nightly  . SVD (spontaneous vaginal delivery)    x 1  . UTI (lower urinary tract infection)    BP (!) 151/90   Pulse 62   Temp 99 F (37.2 C)   Ht 5\' 2"  (1.575 m)   Wt 233 lb (105.7 kg)   SpO2 96%   BMI 42.62 kg/m   Opioid Risk Score:   Fall Risk Score:  `1  Depression screen PHQ 2/9  Depression screen [facility] 2/9 12/14/2020 03/10/2019 11/02/2018 08/23/2015 04/25/2015 04/07/2015 09/16/2014  Decreased Interest 0 0 0 0 0 0 0  Down, Depressed, Hopeless 0 0 2 0 0 0 0   PHQ - 2 Score 0 0 2 0 0 0 0  Altered sleeping 1 0 3 - - - -  Tired, decreased energy 1 0 3 - - - -  Change in appetite 3 0 3 - - - -  Feeling bad or failure about yourself  0 0 0 - - - -  Trouble concentrating 0 0 0 - - - -  Moving slowly or fidgety/restless 0 0 0 - - - -  Suicidal thoughts 0 0 0 - - - -  PHQ-9 Score 5 0 11 - - - -  Difficult doing work/chores Somewhat difficult Not difficult at all - - - - -    Review of Systems  Constitutional: Positive for appetite change and fatigue.  Eyes: Negative.   Respiratory: Positive for apnea and shortness of breath.   Cardiovascular: Negative.   Gastrointestinal: Negative.   Endocrine: Negative.   Genitourinary: Negative.   Musculoskeletal: Positive for arthralgias, back pain and myalgias.  Skin: Negative.   Allergic/Immunologic: Negative.   Neurological: Positive for weakness.  Psychiatric/Behavioral: Positive for sleep disturbance.       Objective:   Physical Exam Vitals reviewed.  Constitutional:      Appearance: She is obese.  HENT:     Head: Normocephalic and atraumatic.  Eyes:     Extraocular Movements: Extraocular movements intact.     Conjunctiva/sclera: Conjunctivae normal.     Pupils: Pupils are equal, round, and reactive to light.  Musculoskeletal:     Right lower leg: No edema.     Left lower leg: No edema.  Skin:    General: Skin is warm and dry.  Neurological:     Mental Status: She is alert and oriented to person, place, and time.  Psychiatric:        Mood and Affect: Mood normal.        Behavior: Behavior normal.     Patient has tenderness bilateral upper trapezius bilateral greater trochanter bilateral PSIS area Lumbar range of motion is 50% flexion extension lateral bending and rotation Negative straight leg raising bilaterally Motor strength is 5/5 bilateral deltoid bicep tricep grip hip flexor knee extensor ankle dorsiflexion plantar flexor      Assessment & Plan:  1.  Fibromyalgia  syndrome she has tenderness with very light palpation in multiple areas primarily in the  upper and lower back and hip area.  We will increase duloxetine to 30 mg/day Follow-up in 6 weeks Advised aquatic therapy will make referral Advised neuropsych for cognitive behavioral therapy

## 2021-02-08 NOTE — Patient Instructions (Addendum)
May benefit from Sleep Medicine MD follow up  Referred to Dr Marge Duncans or [place] in our office for pain and anxiety  Referred to [facility] for aquatic therapy

## 2021-02-12 ENCOUNTER — Other Ambulatory Visit: Payer: Self-pay

## 2021-02-12 ENCOUNTER — Encounter (HOSPITAL_BASED_OUTPATIENT_CLINIC_OR_DEPARTMENT_OTHER): Payer: Medicare Other | Admitting: Psychology

## 2021-02-12 ENCOUNTER — Encounter: Payer: Self-pay | Admitting: Psychology

## 2021-02-12 DIAGNOSIS — F068 Other specified mental disorders due to known physiological condition: Secondary | ICD-10-CM | POA: Diagnosis not present

## 2021-02-12 DIAGNOSIS — G4733 Obstructive sleep apnea (adult) (pediatric): Secondary | ICD-10-CM

## 2021-02-12 DIAGNOSIS — M47816 Spondylosis without myelopathy or radiculopathy, lumbar region: Secondary | ICD-10-CM

## 2021-02-12 DIAGNOSIS — F331 Major depressive disorder, recurrent, moderate: Secondary | ICD-10-CM | POA: Diagnosis not present

## 2021-02-12 DIAGNOSIS — M797 Fibromyalgia: Secondary | ICD-10-CM | POA: Diagnosis not present

## 2021-02-12 DIAGNOSIS — Z720 Tobacco use: Secondary | ICD-10-CM

## 2021-02-12 DIAGNOSIS — I10 Essential (primary) hypertension: Secondary | ICD-10-CM

## 2021-02-12 DIAGNOSIS — R7303 Prediabetes: Secondary | ICD-10-CM
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
## 2021-03-01 ENCOUNTER — Encounter (HOSPITAL_BASED_OUTPATIENT_CLINIC_OR_DEPARTMENT_OTHER): Payer: Medicare Other | Admitting: Psychology

## 2021-03-01 ENCOUNTER — Other Ambulatory Visit: Payer: Self-pay

## 2021-03-01 DIAGNOSIS — G4733 Obstructive sleep apnea (adult) (pediatric): Secondary | ICD-10-CM

## 2021-03-01 DIAGNOSIS — F068 Other specified mental disorders due to known physiological condition: Secondary | ICD-10-CM

## 2021-03-01 DIAGNOSIS — M797 Fibromyalgia: Secondary | ICD-10-CM | POA: Diagnosis not present

## 2021-03-01 DIAGNOSIS — F331 Major depressive disorder, recurrent, moderate: Secondary | ICD-10-CM

## 2021-03-06 ENCOUNTER — Other Ambulatory Visit: Payer: Self-pay

## 2021-03-06 ENCOUNTER — Encounter (HOSPITAL_BASED_OUTPATIENT_CLINIC_OR_DEPARTMENT_OTHER): Payer: Medicare Other | Admitting: Psychology

## 2021-03-06 ENCOUNTER — Other Ambulatory Visit: Payer: Self-pay | Admitting: Physical Medicine & Rehabilitation

## 2021-03-06 DIAGNOSIS — F068 Other specified mental disorders due to known physiological condition: Secondary | ICD-10-CM | POA: Diagnosis not present

## 2021-03-06 DIAGNOSIS — M797 Fibromyalgia: Secondary | ICD-10-CM | POA: Diagnosis not present

## 2021-03-06 DIAGNOSIS — G4733 Obstructive sleep apnea (adult) (pediatric): Secondary | ICD-10-CM

## 2021-03-06 DIAGNOSIS — F331 Major depressive disorder, recurrent, moderate: Secondary | ICD-10-CM

## 2021-03-08 ENCOUNTER — Encounter: Payer: Self-pay | Admitting: Physical Medicine & Rehabilitation

## 2021-03-08 ENCOUNTER — Encounter: Payer: Medicare Other | Attending: Physical Medicine & Rehabilitation | Admitting: Physical Medicine & Rehabilitation

## 2021-03-08 ENCOUNTER — Other Ambulatory Visit: Payer: Self-pay

## 2021-03-08 VITALS — BP 135/83 | HR 73 | Temp 99.3°F | Ht 62.0 in | Wt 228.4 lb

## 2021-03-08 DIAGNOSIS — F068 Other specified mental disorders due to known physiological condition: Secondary | ICD-10-CM | POA: Diagnosis present

## 2021-03-08 MED ORDER — PREGABALIN 200 MG PO CAPS: 200.0000 mg | ORAL_CAPSULE | Freq: Two times a day (BID) | ORAL | 5 refills | Status: DC

## 2021-03-08 NOTE — Patient Instructions (Signed)
Shoulder Impingement Syndrome Rehab Ask your health care provider which exercises are safe for you. Do exercises exactly as told by your health care provider and adjust them as directed. It is normal to feel mild stretching, pulling, tightness, or discomfort as you do these exercises. Stop right away if you feel sudden pain or your pain gets worse. Do not begin these exercises until told by your health care provider. Stretching and range-of-motion exercise This exercise warms up your muscles and joints and improves the movement and flexibility of your shoulder. This exercise also helps to relieve pain and stiffness. Passive horizontal adduction In passive adduction, you use your other hand to move the injured arm toward your body. The injured arm does not move on its own. In this movement, your arm is moved across your body in the horizontal plane (horizontal adduction). 1. Sit or stand and pull your left / right elbow across your chest, toward your other shoulder. Stop when you feel a gentle stretch in the back of your shoulder and upper arm. ? Keep your arm at shoulder height. ? Keep your arm as close to your body as you comfortably can. 2. Hold for __________ seconds. 3. Slowly return to the starting position. Repeat __________ times. Complete this exercise __________ times a day.   Strengthening exercises These exercises build strength and endurance in your shoulder. Endurance is the ability to use your muscles for a long time, even after they get tired. External rotation, isometric This is an exercise in which you press the back of your wrist against a door frame without moving your shoulder joint (isometric). 1. Stand or sit in a doorway, facing the door frame. 2. Bend your left / right elbow and place the back of your wrist against the door frame. Only the back of your wrist should be touching the frame. Keep your upper arm at your side. 3. Gently press your wrist against the door frame, as  if you are trying to push your arm away from your abdomen (external rotation). Press as hard as you are able without pain. ? Avoid shrugging your shoulder while you press your wrist against the door frame. Keep your shoulder blade tucked down toward the middle of your back. 4. Hold for __________ seconds. 5. Slowly release the tension, and relax your muscles completely before you repeat the exercise. Repeat __________ times. Complete this exercise __________ times a day. Internal rotation, isometric This is an exercise in which you press your palm against a door frame without moving your shoulder joint (isometric). 1. Stand or sit in a doorway, facing the door frame. 2. Bend your left / right elbow and place the palm of your hand against the door frame. Only your palm should be touching the frame. Keep your upper arm at your side. 3. Gently press your hand against the door frame, as if you are trying to push your arm toward your abdomen (internal rotation). Press as hard as you are able without pain. ? Avoid shrugging your shoulder while you press your hand against the door frame. Keep your shoulder blade tucked down toward the middle of your back. 4. Hold for __________ seconds. 5. Slowly release the tension, and relax your muscles completely before you repeat the exercise. Repeat __________ times. Complete this exercise __________ times a day.   Scapular protraction, supine 1. Lie on your back on a firm surface (supine position). Hold a __________ weight in your left / right hand. 2. Raise your left /  right arm straight into the air so your hand is directly above your shoulder joint. 3. Push the weight into the air so your shoulder (scapula) lifts off the surface that you are lying on. The scapula will push up or forward (protraction). Do not move your head, neck, or back. 4. Hold for __________ seconds. 5. Slowly return to the starting position. Let your muscles relax completely before you  repeat this exercise. Repeat __________ times. Complete this exercise __________ times a day.   Scapular retraction 1. Sit in a stable chair without armrests, or stand up. 2. Secure an exercise band to a stable object in front of you so the band is at shoulder height. 3. Hold one end of the exercise band in each hand. Your palms should face down. 4. Squeeze your shoulder blades together (retraction) and move your elbows slightly behind you. Do not shrug your shoulders upward while you do this. 5. Hold for __________ seconds. 6. Slowly return to the starting position. Repeat __________ times. Complete this exercise __________ times a day.   Shoulder extension 1. Sit in a stable chair without armrests, or stand up. 2. Secure an exercise band to a stable object in front of you so the band is above shoulder height. 3. Hold one end of the exercise band in each hand. 4. Straighten your elbows and lift your hands up to shoulder height. 5. Squeeze your shoulder blades together and pull your hands down to the sides of your thighs (extension). Stop when your hands are straight down by your sides. Do not let your hands go behind your body. 6. Hold for __________ seconds. 7. Slowly return to the starting position. Repeat __________ times. Complete this exercise __________ times a day.   This information is not intended to replace advice given to you by your health care provider. Make sure you discuss any questions you have with your health care provider. Document Revised: 01/15/2019 Document Reviewed: 10/19/2018 Elsevier Patient Education  2021 [employer].

## 2021-03-08 NOTE — Progress Notes (Signed)
Subjective:    Patient ID: Dawn Lucas, female    DOB: 11-29-1955, 65 y.o.   MRN: [id number]  HPI 65 year old female with history of widespread body pain diagnosed by rheumatology with fibromyalgia several years ago.  She has tried physical therapy for her back pain in the past.  Currently on Cymbalta 30 mg/day as well as Mobic 15 mg/day.  She had been prescribed Lyrica/pregabalin 200 mg twice daily in March but does not remember if this was helpful for her.  She did not have a refill.  This is not listed as an allergy. Her primary complaint today is bilateral upper back pain.  She does not do any type of exercise for her upper body.  She does some walking for lower body exercise. Pain Inventory Average Pain 6 Pain Right Now 7 My pain is intermittent, constant, sharp and aching  In the last 24 hours, has pain interfered with the following? General activity 6 Relation with others 0 Enjoyment of life 7 What TIME of day is your pain at its worst? night Sleep (in general) Poor  Pain is worse with: unsure Pain improves with: nothing Relief from Meds: 0  Family History  Problem Relation Age of Onset  . Colon cancer Mother   . Diabetes type II Maternal Grandmother   . Diabetes type II Paternal Grandmother   . Heart attack Sister   . HIV Brother   . Cirrhosis Brother   . Diabetes Sister   . HIV Brother    Social History   Socioeconomic History  . Marital status: Single    Spouse name: Not on file  . Number of children: 1  . Years of education: Not on file  . Highest education level: 10th grade  Occupational History  . Occupation: disabled  Tobacco Use  . Smoking status: Current Every Day Smoker    Packs/day: 0.50    Years: 50.00    Pack years: 25.00    Types: Cigarettes  . Smokeless tobacco: Former Systems developer    Types: Snuff, Chew  Vaping Use  . Vaping Use: Never used  Substance and Sexual Activity  . Alcohol use: Yes    Comment: rarely   . Drug use: No  . Sexual  activity: Yes    Birth control/protection: Condom, Post-menopausal  Other Topics Concern  . Not on file  Social History Narrative   Patient is right-handed. She lives alone.She drinks 3-4 cups of coffee a week, and green tea most days.   Social Determinants of Health   Financial Resource Strain: Not on file  Food Insecurity: Not on file  Transportation Needs: Not on file  Physical Activity: Not on file  Stress: Not on file  Social Connections: Not on file   Past Surgical History:  Procedure Laterality Date  . CHOLECYSTECTOMY    . COLON SURGERY    . DILATION AND CURETTAGE OF UTERUS     MAB x 4  . HYSTEROSCOPY WITH D & C N/A 02/23/2018   Procedure: DILATATION AND CURETTAGE /HYSTEROSCOPY;  Surgeon: Mora Bellman, MD;  Location: [facility];  Service: Gynecology;  Laterality: N/A;  . OVARIAN CYST REMOVAL     Past Surgical History:  Procedure Laterality Date  . CHOLECYSTECTOMY    . COLON SURGERY    . DILATION AND CURETTAGE OF UTERUS     MAB x 4  . HYSTEROSCOPY WITH D & C N/A 02/23/2018   Procedure: DILATATION AND CURETTAGE /HYSTEROSCOPY;  Surgeon: Mora Bellman, MD;  Location: [facility];  Service: Gynecology;  Laterality: N/A;  . OVARIAN CYST REMOVAL     Past Medical History:  Diagnosis Date  . Anemia   . Arthritis    hands, knees  . Asthma   . Back pain   . Fibromyalgia   . Headache   . Hyperlipidemia   . Hypertension 11/04/2011  . Poor dental hygiene    missing and chipped teeth  . Sleep apnea    Uses CPAP nightly  . SVD (spontaneous vaginal delivery)    x 1  . UTI (lower urinary tract infection)    BP 135/83   Pulse 73   Temp 99.3 F (37.4 C)   Ht 5\' 2"  (1.575 m)   Wt 228 lb 6.4 oz (103.6 kg)   SpO2 98%   BMI 41.77 kg/m   Opioid Risk Score:   Fall Risk Score:  `1  Depression screen PHQ 2/9  Depression screen [facility] 2/9 12/14/2020 03/10/2019 11/02/2018 08/23/2015 04/25/2015 04/07/2015 09/16/2014  Decreased Interest 0 0 0 0 0 0 0  Down, Depressed, Hopeless 0 0 2 0 0  0 0  PHQ - 2 Score 0 0 2 0 0 0 0  Altered sleeping 1 0 3 - - - -  Tired, decreased energy 1 0 3 - - - -  Change in appetite 3 0 3 - - - -  Feeling bad or failure about yourself  0 0 0 - - - -  Trouble concentrating 0 0 0 - - - -  Moving slowly or fidgety/restless 0 0 0 - - - -  Suicidal thoughts 0 0 0 - - - -  PHQ-9 Score 5 0 11 - - - -  Difficult doing work/chores Somewhat difficult Not difficult at all - - - - -   Review of Systems  Musculoskeletal: Positive for arthralgias, back pain, gait problem and neck pain.       Pain in both shoulder, all joints & feet cramps at nigh  All other systems reviewed and are negative.      Objective:   Physical Exam Vitals and nursing note reviewed.  Constitutional:      General: She is not in acute distress.    Appearance: She is obese.  Eyes:     Extraocular Movements: Extraocular movements intact.     Conjunctiva/sclera: Conjunctivae normal.     Pupils: Pupils are equal, round, and reactive to light.  Musculoskeletal:     Comments: The patient is tenderness with light palpation bilateral upper trapezius bilateral.  Periscapular areas  Skin:    General: Skin is warm and dry.  Neurological:     General: No focal deficit present.     Mental Status: She is alert and oriented to person, place, and time.     Comments: Upper extremity strength 5/5 bilateral deltoid bicep tricep grip as well as hip flexor knee extensor ankle dorsiflexion.  Psychiatric:        Mood and Affect: Mood normal.           Assessment & Plan:  #1.  Fibromyalgia syndrome primary pain generator, continue Cymbalta 30 mg/day, resume Lyrica 200 mg twice daily Continue Mobic 15 mg a day although we may discontinue this at next visit Instruction in upper back exercises rolling using Thera-Band around doorknob as well as arm extensions

## 2021-03-09 ENCOUNTER — Telehealth: Payer: Self-pay

## 2021-03-09 ENCOUNTER — Encounter: Payer: Self-pay | Admitting: Psychology

## 2021-03-09 ENCOUNTER — Telehealth: Payer: Self-pay | Admitting: Family

## 2021-03-09 NOTE — Telephone Encounter (Signed)
PA was submitted for Pregablin 200 mg on 03/09/2021.
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
## 2021-03-09 NOTE — Telephone Encounter (Signed)
   Dawn Lucas DOB: June 19, 1956 MRN: [id number]   RIDER WAIVER AND RELEASE OF LIABILITY  For purposes of improving physical access to our facilities, [facility] is pleased to partner with third parties to provide [place] patients or other authorized individuals the option of convenient, on-demand ground transportation services (the [place]") through use of the technology service that enables users to request on-demand ground transportation from independent third-party providers.  By opting to use and accept these [employer], I, the undersigned, hereby agree on behalf of myself, and on behalf of any minor child using the [employer] for whom I am the parent or legal guardian, as follows:  1. Government social research officer provided to me are provided by independent third-party transportation providers who are not [employer] or employees and who are unaffiliated with [employer]. 2. [place] is neither a transportation carrier nor a common or public carrier. 3. [facility] has no control over the quality or safety of the transportation that occurs as a result of the [employer]. 4. [facility] cannot guarantee that any third-party transportation provider will complete any arranged transportation service. 5. [facility] makes no representation, warranty, or guarantee regarding the reliability, timeliness, quality, safety, suitability, or availability of any of the Transport Services or that they will be error free. 6. I fully understand that traveling by vehicle involves risks and dangers of serious bodily injury, including permanent disability, paralysis, and death. I agree, on behalf of myself and on behalf of any minor child using the Transport Services for whom I am the parent or legal guardian, that the entire risk arising out of my use of the [employer] remains solely with me, to the maximum extent permitted under applicable law. 7. The [employer] are provided "as is" and "as available." [place] disclaims all representations and warranties, express, implied or statutory, not expressly set out in these terms, including the implied warranties of merchantability and fitness for a particular purpose. 8. I hereby waive and release [facility], its agents, employees, officers, directors, representatives, insurers, attorneys, assigns, successors, subsidiaries, and affiliates from any and all past, present, or future claims, demands, liabilities, actions, causes of action, or suits of any kind directly or indirectly arising from acceptance and use of the [employer]. 9. I further waive and release [facility] and its affiliates from all present and future liability and responsibility for any injury or death to persons or damages to property caused by or related to the use of the [employer]. 10. I have read this Waiver and Release of Liability, and I understand the terms used in it and their legal significance. This Waiver is freely and voluntarily given with the understanding that my right (as well as the right of any minor child for whom I am the parent or legal guardian using the [employer]) to legal recourse against [place] in connection with the [employer] is knowingly surrendered in return for use of these services.   I attest that I read the consent document to Dawn Lucas, gave Dawn Lucas the opportunity to ask questions and answered the questions asked (if any). I affirm that Dawn Lucas then provided consent for she's participation in this program.     Dawn Lucas

## 2021-03-11 ENCOUNTER — Encounter: Payer: Self-pay | Admitting: Psychology
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
## 2021-03-12 ENCOUNTER — Ambulatory Visit: Payer: Medicare Other

## 2021-03-13 ENCOUNTER — Encounter (HOSPITAL_BASED_OUTPATIENT_CLINIC_OR_DEPARTMENT_OTHER): Payer: Self-pay | Admitting: Physical Therapy

## 2021-03-13 ENCOUNTER — Other Ambulatory Visit: Payer: Self-pay

## 2021-03-13 ENCOUNTER — Emergency Department (HOSPITAL_BASED_OUTPATIENT_CLINIC_OR_DEPARTMENT_OTHER)
Admission: EM | Admit: 2021-03-13 | Discharge: 2021-03-13 | Discharge status: Absent without leave | Payer: Medicare Other

## 2021-03-13 ENCOUNTER — Ambulatory Visit (HOSPITAL_BASED_OUTPATIENT_CLINIC_OR_DEPARTMENT_OTHER): Payer: Medicare Other | Attending: Physical Medicine & Rehabilitation | Admitting: Physical Therapy

## 2021-03-13 DIAGNOSIS — G8929 Other chronic pain: Secondary | ICD-10-CM | POA: Insufficient documentation

## 2021-03-13 DIAGNOSIS — M25512 Pain in left shoulder: Secondary | ICD-10-CM | POA: Insufficient documentation

## 2021-03-13 DIAGNOSIS — R262 Difficulty in walking, not elsewhere classified: Secondary | ICD-10-CM | POA: Diagnosis present

## 2021-03-13 DIAGNOSIS — M545 Low back pain, unspecified: Secondary | ICD-10-CM | POA: Diagnosis present

## 2021-03-13 NOTE — Therapy (Signed)
[facility], [place], [address] Phone: [phone]   Fax:  [phone]  Physical Therapy Evaluation  Patient Details  Name: Dawn Lucas MRN: [id number] Date of Birth: [birth year] Referring Provider (PT): Alysia Penna, MD   Encounter Date: 03/13/2021   PT End of Session - 03/13/21 1507    Visit Number 1    Number of Visits 20    Date for PT Re-Evaluation 05/18/21    Authorization Type MCR/MCD    Progress Note Due on Visit 10    PT Start Time 1503    PT Stop Time 1545    PT Time Calculation (min) 42 min    Activity Tolerance Patient tolerated treatment well    Behavior During Therapy [facility] for tasks assessed/performed           Past Medical History:  Diagnosis Date  . Anemia   . Arthritis    hands, knees  . Asthma   . Back pain   . Fibromyalgia   . Headache   . Hyperlipidemia   . Hypertension 11/04/2011  . Poor dental hygiene    missing and chipped teeth  . Sleep apnea    Uses CPAP nightly  . SVD (spontaneous vaginal delivery)    x 1  . UTI (lower urinary tract infection)     Past Surgical History:  Procedure Laterality Date  . CHOLECYSTECTOMY    . COLON SURGERY    . DILATION AND CURETTAGE OF UTERUS     MAB x 4  . HYSTEROSCOPY WITH D & C N/A 02/23/2018   Procedure: DILATATION AND CURETTAGE /HYSTEROSCOPY;  Surgeon: Mora Bellman, MD;  Location: [facility];  Service: Gynecology;  Laterality: N/A;  . OVARIAN CYST REMOVAL      There were no vitals filed for this visit.    Subjective Assessment - 03/13/21 1509    Subjective I haven't eaten in two days because the top of my stomach is so sore. For the last 3 days I have been feeling pretty bad. I have Lt neck/shoulder pain, back pain and knee pain. Diagnosed with fibromyalgia about 5 years ago. I am taking the meds but I cannot sleep. I use a CPAP machine but am having a hard time getting supplies and have not been able to use it in a  year. Laying on my side hurts my shoulders and I stop breathing when I lay on my back. When I do sleep its 2-4 hours and I am tired all the time.    How long can you stand comfortably? have to sit to shower    How long can you walk comfortably? unable, unable to sweep my small floor    Patient Stated Goals walk up hill & ride bus & go to store, decrease pain    Currently in Pain? Yes    Pain Score 6     Pain Location --   general gross pain   Pain Orientation Left   Left is weakest side   Pain Descriptors / Indicators --   tense   Aggravating Factors  constant pain, fluctuating on good/bad days    Pain Relieving Factors rest              [facility] PT Assessment - 03/13/21 0001      Assessment   Medical Diagnosis fibromyalgia syndrome    Referring Provider (PT) Alysia Penna, MD    Onset Date/Surgical Date --   chronic   Hand Dominance Right  Prior Therapy no      Precautions   Precaution Comments blackouts of insidious onset about 1 every couple of years; high anxiety      Restrictions   Weight Bearing Restrictions No      Balance Screen   Has the patient fallen in the past 6 months No      [place] residence    Living Arrangements Spouse/significant other    Additional Comments caring for fiance that recently got hit by an SUV      Prior Function   Vocation On disability      Cognition   Overall Cognitive Status Within Functional Limits for tasks assessed      Sensation   Additional Comments [facility]      Posture/Postural Control   Posture Comments rounded shoulders, forward head. tends to hike shoulders at rest      ROM / Strength   AROM / PROM / Strength Strength      Strength   Overall Strength Comments gross 4/5, Rt LE 3/5      Palpation   Palpation comment TTP all over      Special Tests   Other special tests 5TSTS 18s      6 minute walk test results    Aerobic Endurance Distance Walked 800    Endurance  additional comments multiple standing rest breaks, antalgic on Lt                      Objective measurements completed on examination: See above findings.               PT Education - 03/13/21 2002    Education Details anatomy of condition, POC, aquatics    Person(s) Educated Patient    Methods Explanation    Comprehension Verbalized understanding;Need further instruction            PT Short Term Goals - 03/13/21 2005      PT SHORT TERM GOAL #1   Title pt will be aware of posture to decrease shoulder elevation    Baseline required frequent reminders at eval    Time 3    Period Weeks    Status New    Target Date 04/06/21             PT Long Term Goals - 03/13/21 2006      PT LONG TERM GOAL #1   Title Gross extremity strength 5/5    Baseline see flowsheet    Time 9    Period Weeks    Status New    Target Date 05/18/21      PT LONG TERM GOAL #2   Title 5TSTS to 12s    Baseline 18s at eval    Time 9    Period Weeks    Status New    Target Date 05/18/21      PT LONG TERM GOAL #3   Title 6MWT to improve by [place] x2    Baseline 800 ft at eval    Time 9    Period Weeks    Status New    Target Date 05/18/21      PT LONG TERM GOAL #4   Title pt will be independent with long term HEP for strength, flexibility and mobility    Baseline to be established as appropriate    Time 9    Period Weeks    Status New    Target  Date 05/18/21                  Plan - 03/13/21 1639    Clinical Impression Statement Pt presents to PT with complaints of chronic fibromyalgia with most pain notable at Lt shoulder. LBP incr with walking and is limited in regards to 6MWT normative values. She will complete 8 visits of aquatic PT and return to land visit at visit 10 for re-evaluation. Pt was a little nervous about water but verbalized increased confidence after showing her the pool and support she will have. For HEP, I asked her to stand/walk more  frequently for shorter periods of time and perform large ROM extremity motions unweighted. She reports high levels of anxiety and she does tend to hiker her shoulders when she is getting stressed while talking. Pt will benefit from skilled PT to address pain levels and improve functional mobility.    Personal Factors and Comorbidities Comorbidity 2;Time since onset of injury/illness/exacerbation;Transportation;Fitness    Comorbidities anxiety, HTN    Examination-Activity Limitations Bathing;Hygiene/Grooming;Squat;Bend;Stand;Locomotion Level;Caring for Others;Carry;Sit;Sleep    Examination-Participation Restrictions Laundry;Cleaning;Meal Prep;Community Activity    Stability/Clinical Decision Making Evolving/Moderate complexity    Clinical Decision Making Moderate    Rehab Potential Good    PT Frequency 2x / week    PT Duration Other (comment)   9 weeks   PT Treatment/Interventions ADLs/Self Care Home Management;Aquatic Therapy;Cryotherapy;Ultrasound;Moist Heat;Iontophoresis 4mg /ml Dexamethasone;Electrical Stimulation;Gait training;Functional mobility training;Stair training;Neuromuscular re-education;Therapeutic exercise;Therapeutic activities;Patient/family education;Manual techniques;Passive range of motion;Taping    PT Next Visit Plan in pool: neck stretching/periscapular activation, water walking/endurance challenges    PT Home Exercise Plan stand/walk frequently for short periods, large range extremity motions, posture    Consulted and Agree with Plan of Care Patient           Patient will benefit from skilled therapeutic intervention in order to improve the following deficits and impairments:  Difficulty walking,Increased muscle spasms,Improper body mechanics,Decreased activity tolerance,Decreased strength,Impaired flexibility,Pain,Postural dysfunction  Visit Diagnosis: Chronic left shoulder pain - Plan: PT plan of care cert/re-cert  Chronic low back pain without sciatica, unspecified  back pain laterality - Plan: PT plan of care cert/re-cert  Difficulty in walking, not elsewhere classified - Plan: PT plan of care cert/re-cert     Problem List Patient Active Problem List   Diagnosis Date Noted  . Thickened endometrium   . Prediabetes 04/28/2015  . Pes planus of both feet 03/28/2015  . Seborrheic keratoses 08/22/2014  . Headache(784.0) 05/17/2014  . Lower extremity edema 09/22/2013  . Vitamin D deficiency 09/22/2013  . Tendinitis of left rotator cuff 06/17/2013  . Right ankle pain 06/17/2013  . Colon cancer screening 06/17/2013  . Knee pain, bilateral 03/20/2012  . Hypertension 11/04/2011  . Generalized pain 10/15/2011  . Back pain 02/08/2011  . Obese 02/08/2011  . Tobacco abuse 02/08/2011  . Sleep disorder 02/08/2011    Kee Drudge C. Corrinne Benegas PT, DPT 03/13/21 8:10 PM   [facility] [address] [place], [place], [address] Phone: [phone]   Fax:  [phone]  Name: Dawn Lucas MRN: [id number] Date of Birth: 04/13/1956

## 2021-03-15 ENCOUNTER — Ambulatory Visit (HOSPITAL_BASED_OUTPATIENT_CLINIC_OR_DEPARTMENT_OTHER): Payer: Medicare Other | Admitting: Physical Therapy

## 2021-03-15 ENCOUNTER — Encounter (HOSPITAL_BASED_OUTPATIENT_CLINIC_OR_DEPARTMENT_OTHER): Payer: Self-pay | Admitting: Physical Therapy

## 2021-03-15 ENCOUNTER — Other Ambulatory Visit: Payer: Self-pay

## 2021-03-15 DIAGNOSIS — M25512 Pain in left shoulder: Secondary | ICD-10-CM | POA: Diagnosis not present

## 2021-03-15 DIAGNOSIS — G8929 Other chronic pain: Secondary | ICD-10-CM

## 2021-03-15 DIAGNOSIS — M545 Low back pain, unspecified: Secondary | ICD-10-CM

## 2021-03-15 DIAGNOSIS — R262 Difficulty in walking, not elsewhere classified: Secondary | ICD-10-CM

## 2021-03-15 NOTE — Therapy (Signed)
[facility] [address] [place], [place], [address] Phone: [phone]   Fax:  [phone]  Physical Therapy Treatment  Patient Details  Name: Dawn Lucas MRN: [id number] Date of Birth: 01-16-1956 Referring Provider (PT): Alysia Penna, MD   Encounter Date: 03/15/2021   PT End of Session - 03/15/21 1201     Visit Number 2    Number of Visits 20    Date for PT Re-Evaluation 05/18/21    Authorization Type MCR/MCD    PT Start Time 1037    PT Stop Time 1125    PT Time Calculation (min) 48 min    Activity Tolerance Patient tolerated treatment well;Other (comment)   apprehensivness initially to water   Behavior During Therapy [facility] for tasks assessed/performed             Past Medical History:  Diagnosis Date   Anemia    Arthritis    hands, knees   Asthma    Back pain    Fibromyalgia    Headache    Hyperlipidemia    Hypertension 11/04/2011   Poor dental hygiene    missing and chipped teeth   Sleep apnea    Uses CPAP nightly   SVD (spontaneous vaginal delivery)    x 1   UTI (lower urinary tract infection)     Past Surgical History:  Procedure Laterality Date   CHOLECYSTECTOMY     COLON SURGERY     DILATION AND CURETTAGE OF UTERUS     MAB x 4   HYSTEROSCOPY WITH D & C N/A 02/23/2018   Procedure: DILATATION AND CURETTAGE /HYSTEROSCOPY;  Surgeon: Mora Bellman, MD;  Location: [facility];  Service: Gynecology;  Laterality: N/A;   OVARIAN CYST REMOVAL      There were no vitals filed for this visit.   Subjective Assessment - 03/15/21 1159     Subjective I am not comfortable in the water yet.  I am very nervous.  Pain is "not bad right now"    How long can you stand comfortably? have to sit to shower    How long can you walk comfortably? unable, unable to sweep my small floor    Patient Stated Goals walk up hill & ride bus & go to store, decrease pain             TREATMENT  Pt seen for aquatic  therapy today.  Treatment took place in water 3.25-4.8 ft in depth at the [employer] pool. Temp of water was 91.  Pt entered/exited the pool via stairs (step through pattern) independently with bilat rail.  Warm up Water walking with hand held assist of PT with opposite hand on wall demonstrating depth of water, introducing pt to benefits of aquatic setting. Side stepping length of pool holding bilaterally to wall.  Seated on water bench 80% submerged Pt requiring hha of PT for decreased apprehensiveness; stretching of gastroc and hamstring AA then active. Shld and cervical spine ROM and stretching: horizontal abd/add using barbell, shoulder depression and scapular retraction. Flex, extension, rotation and lateral bending of c-spine. Sit to stand and hip hinges with cues for posture and core stabilization  Standing holding to wall;Toe raises 2 x 10 reps. Balance challenges/core strengthening standing unsupported manual perturbations submerged to chest. Progressed to walking forward and back bilateral hand held assist, decreasing to unilateral then unsupported. Cuing for hand placement for increased resistance Core rotation with gliding noodle through rotation range right and left.  Pt requires  buoyancy for support and to offload joints with strengthening exercises. Viscosity of the water is needed for resistance of strengthening; water current perturbations provides challenge to standing balance unsupported, requiring increased core activation.   Pt reports upon completion deceased tightness/stiffness and discomfort throughout lb abd left shoulder as well as feeling tired.                          PT Education - 03/15/21 1200     Education Details Posture/alignment of spine. Decrease guarding    Person(s) Educated Patient    Methods Explanation    Comprehension Verbalized understanding              PT Short Term Goals - 03/13/21 2005       PT SHORT  TERM GOAL #1   Title pt will be aware of posture to decrease shoulder elevation    Baseline required frequent reminders at eval    Time 3    Period Weeks    Status New    Target Date 04/06/21               PT Long Term Goals - 03/13/21 2006       PT LONG TERM GOAL #1   Title Gross extremity strength 5/5    Baseline see flowsheet    Time 9    Period Weeks    Status New    Target Date 05/18/21      PT LONG TERM GOAL #2   Title 5TSTS to 12s    Baseline 18s at eval    Time 9    Period Weeks    Status New    Target Date 05/18/21      PT LONG TERM GOAL #3   Title 6MWT to improve by [employer] [employer] x2    Baseline 800 ft at eval    Time 9    Period Weeks    Status New    Target Date 05/18/21      PT LONG TERM GOAL #4   Title pt will be independent with long term HEP for strength, flexibility and mobility    Baseline to be established as appropriate    Time 9    Period Weeks    Status New    Target Date 05/18/21                   Plan - 03/15/21 1203     Clinical Impression Statement Pt presents today nervous about aquatic setting. After gentle introduction into water pt able to participate remaining in the 3-4 ft.  She completes stretching and strengtheing of left shld and core/lb with reports of decreased discomfort upon completion. She is given cervical and shoulder exercises for HEP with explination on improtance of completion in decreasing pain.    Personal Factors and Comorbidities Comorbidity 2;Time since onset of injury/illness/exacerbation;Transportation;Fitness    Examination-Activity Limitations Bathing;Hygiene/Grooming;Squat;Bend;Stand;Locomotion Level;Caring for Others;Carry;Sit;Sleep    Examination-Participation Restrictions Laundry;Cleaning;Meal Prep;Community Activity    Stability/Clinical Decision Making Evolving/Moderate complexity    Clinical Decision Making Moderate    Rehab Potential Good    PT Frequency 2x / week    PT Duration Other (comment)     PT Treatment/Interventions ADLs/Self Care Home Management;Aquatic Therapy;Cryotherapy;Ultrasound;Moist Heat;Iontophoresis 4mg /ml Dexamethasone;Electrical Stimulation;Gait training;Functional mobility training;Stair training;Neuromuscular re-education;Therapeutic exercise;Therapeutic activities;Patient/family education;Manual techniques;Passive range of motion;Taping    PT Next Visit Plan in pool: neck stretching/periscapular activation, water walking/endurance challenges    PT Home Exercise  Plan stand/walk frequently for short periods, large range extremity motions, posture    Consulted and Agree with Plan of Care Patient             Patient will benefit from skilled therapeutic intervention in order to improve the following deficits and impairments:  Difficulty walking, Increased muscle spasms, Improper body mechanics, Decreased activity tolerance, Decreased strength, Impaired flexibility, Pain, Postural dysfunction  Visit Diagnosis: Chronic left shoulder pain  Chronic low back pain without sciatica, unspecified back pain laterality  Difficulty in walking, not elsewhere classified     Problem List Patient Active Problem List   Diagnosis Date Noted   Thickened endometrium    Prediabetes 04/28/2015   Pes planus of both feet 03/28/2015   Seborrheic keratoses 08/22/2014   Headache(784.0) 05/17/2014   Lower extremity edema 09/22/2013   Vitamin D deficiency 09/22/2013   Tendinitis of left rotator cuff 06/17/2013   Right ankle pain 06/17/2013   Colon cancer screening 06/17/2013   Knee pain, bilateral 03/20/2012   Hypertension 11/04/2011   Generalized pain 10/15/2011   Back pain 02/08/2011   Obese 02/08/2011   Tobacco abuse 02/08/2011   Sleep disorder 02/08/2011    Macky Lower Tramya Schoenfelder MPT 03/15/2021, 12:10 PM  [facility] [place], [place], [address] Phone: [phone]   Fax:  [phone]  Name: Dianne Whelchel MRN: [id number] Date of Birth: [birth year]

## 2021-03-20 ENCOUNTER — Ambulatory Visit: Payer: Medicare Other | Admitting: Psychology

## 2021-03-21 ENCOUNTER — Other Ambulatory Visit: Payer: Self-pay

## 2021-03-21 ENCOUNTER — Ambulatory Visit (HOSPITAL_BASED_OUTPATIENT_CLINIC_OR_DEPARTMENT_OTHER): Payer: Medicare Other | Admitting: Physical Therapy

## 2021-03-21 DIAGNOSIS — M545 Low back pain, unspecified: Secondary | ICD-10-CM

## 2021-03-21 DIAGNOSIS — G8929 Other chronic pain: Secondary | ICD-10-CM

## 2021-03-21 DIAGNOSIS — R262 Difficulty in walking, not elsewhere classified: Secondary | ICD-10-CM

## 2021-03-21 DIAGNOSIS — M25512 Pain in left shoulder: Secondary | ICD-10-CM | POA: Diagnosis not present

## 2021-03-21 NOTE — Therapy (Addendum)
[facility] [address] [place], [place], [address] Phone: [phone]   Fax:  [phone]  Physical Therapy Treatment  Patient Details  Name: Dawn Lucas MRN: [id number] Date of Birth: 07-19-1956 Referring Provider (PT): Alysia Penna, MD   Encounter Date: 03/21/2021   PT End of Session - 03/21/21 0908     PT Start Time 0903             Past Medical History:  Diagnosis Date   Anemia    Arthritis    hands, knees   Asthma    Back pain    Fibromyalgia    Headache    Hyperlipidemia    Hypertension 11/04/2011   Poor dental hygiene    missing and chipped teeth   Sleep apnea    Uses CPAP nightly   SVD (spontaneous vaginal delivery)    x 1   UTI (lower urinary tract infection)     Past Surgical History:  Procedure Laterality Date   CHOLECYSTECTOMY     COLON SURGERY     DILATION AND CURETTAGE OF UTERUS     MAB x 4   HYSTEROSCOPY WITH D & C N/A 02/23/2018   Procedure: DILATATION AND CURETTAGE /HYSTEROSCOPY;  Surgeon: Mora Bellman, MD;  Location: [facility];  Service: Gynecology;  Laterality: N/A;   OVARIAN CYST REMOVAL      There were no vitals filed for this visit.   Subjective Assessment - 03/21/21 0908     Subjective lrft shoulder s better, just alittle noticale discomfort.  Bee doing my exerrcises.  KNees stiff.              TREATMENT   Pt seen for aquatic therapy today.  Treatment took place in water 3.25-4.8 ft in depth at the [facility] pool. Temp of water was 91.  Pt entered/exited the pool via stairs step to pattern and cga with bilat rail.   Warm up Water walking with hand held assist x 2 laps then pushing noodle x 5-6 laps. Forward, backward and side stepping. PT guided x 6 laps forward and 3 laps backward for increased speed for aerobic capacity challenge   Seated on water bench 80% submerged Stretching of gastroc and hamstring. Shld and cervical spine ROM and stretching:  horizontal abd/add using barbell, shoulder depression and scapular retraction x 10 reps. Lateral rotation and bending of c-spine x 10 reps Sit to stand and hip hinges with cues for posture and core stabilization   Standing Facing and holding to wall  hip extension using noodle for stretching of flexor 3 x 20 secs then noodle pull through for strengthening x 10 reps guided by therapist.  Cuing for tight core Manual lb stretching therapsit faciliataing, knees to chest with slight overpressure, then strengthening of erector spinae and abdominals, knees to chest and  jack knife x 7 reps therapist stabilizing at waist.  Core rotation gliding noodle through range right and left. Hip hinges sitting and standing pushing kick board x 10 reps   Pt requires buoyancy for support and to offload joints with strengthening exercises. Viscosity of the water is needed for resistance of strengthening; water current perturbations provides challenge to standing balance unsupported, requiring increased core activation.                            PT Short Term Goals - 03/13/21 2005       PT SHORT TERM GOAL #1  Title pt will be aware of posture to decrease shoulder elevation    Baseline required frequent reminders at eval    Time 3    Period Weeks    Status New    Target Date 04/06/21               PT Long Term Goals - 03/13/21 2006       PT LONG TERM GOAL #1   Title Gross extremity strength 5/5    Baseline see flowsheet    Time 9    Period Weeks    Status New    Target Date 05/18/21      PT LONG TERM GOAL #2   Title 5TSTS to 12s    Baseline 18s at eval    Time 9    Period Weeks    Status New    Target Date 05/18/21      PT LONG TERM GOAL #3   Title 6MWT to improve by [place] x2    Baseline 800 ft at eval    Time 9    Period Weeks    Status New    Target Date 05/18/21      PT LONG TERM GOAL #4   Title pt will be independent with long term HEP for strength,  flexibility and mobility    Baseline to be established as appropriate    Time 9    Period Weeks    Status New    Target Date 05/18/21            Improved confidence and comofrt in aquatic setting.  Pt reporting good relief from first session with significantly decreased shoulder pain.  She did have some discomfort with attempted supine suspension due to guarding.  Pt demonstrates improved balance with water walking completing as progressed holding to noodle w/o LOB. Able to add a cardio element assisting pt in increasing walking speed forward and back x 5-6 lateral laps.        Patient will benefit from skilled therapeutic intervention in order to improve the following deficits and impairments:     Visit Diagnosis: No diagnosis found.     Problem List Patient Active Problem List   Diagnosis Date Noted   Thickened endometrium    Prediabetes 04/28/2015   Pes planus of both feet 03/28/2015   Seborrheic keratoses 08/22/2014   Headache(784.0) 05/17/2014   Lower extremity edema 09/22/2013   Vitamin D deficiency 09/22/2013   Tendinitis of left rotator cuff 06/17/2013   Right ankle pain 06/17/2013   Colon cancer screening 06/17/2013   Knee pain, bilateral 03/20/2012   Hypertension 11/04/2011   Generalized pain 10/15/2011   Back pain 02/08/2011   Obese 02/08/2011   Tobacco abuse 02/08/2011   Sleep disorder 02/08/2011    Vedia Pereyra MPT 03/21/2021, 9:26 AM  [facility] [address] [place], [place], [address] Phone: [phone]   Fax:  [phone]  Name: Naphtali Riede MRN: [id number] Date of Birth: 08/11/1956  Annamarie Major) [place] MPT

## 2021-03-28 ENCOUNTER — Encounter (HOSPITAL_BASED_OUTPATIENT_CLINIC_OR_DEPARTMENT_OTHER): Payer: Self-pay | Admitting: Physical Therapy

## 2021-03-28 ENCOUNTER — Other Ambulatory Visit: Payer: Self-pay

## 2021-03-28 ENCOUNTER — Ambulatory Visit (HOSPITAL_BASED_OUTPATIENT_CLINIC_OR_DEPARTMENT_OTHER): Payer: Medicare Other | Admitting: Physical Therapy

## 2021-03-28 DIAGNOSIS — M545 Low back pain, unspecified: Secondary | ICD-10-CM

## 2021-03-28 DIAGNOSIS — R262 Difficulty in walking, not elsewhere classified: Secondary | ICD-10-CM

## 2021-03-28 DIAGNOSIS — G8929 Other chronic pain: Secondary | ICD-10-CM

## 2021-03-28 DIAGNOSIS — M25512 Pain in left shoulder: Secondary | ICD-10-CM

## 2021-03-28 NOTE — Therapy (Addendum)
[facility] [address] [place], [place], [address] Phone: [phone]   Fax:  [phone]  Physical Therapy Treatment  Patient Details  Name: Dawn Lucas MRN: [id number] Date of Birth: Jun 12, 1956 Referring Provider (PT): Alysia Penna, MD   Encounter Date: 03/28/2021     Past Medical History:  Diagnosis Date   Anemia    Arthritis    hands, knees   Asthma    Back pain    Fibromyalgia    Headache    Hyperlipidemia    Hypertension 11/04/2011   Poor dental hygiene    missing and chipped teeth   Sleep apnea    Uses CPAP nightly   SVD (spontaneous vaginal delivery)    x 1   UTI (lower urinary tract infection)     Past Surgical History:  Procedure Laterality Date   CHOLECYSTECTOMY     COLON SURGERY     DILATION AND CURETTAGE OF UTERUS     MAB x 4   HYSTEROSCOPY WITH D & C N/A 02/23/2018   Procedure: DILATATION AND CURETTAGE /HYSTEROSCOPY;  Surgeon: Mora Bellman, MD;  Location: [facility];  Service: Gynecology;  Laterality: N/A;   OVARIAN CYST REMOVAL     Subjective:  My left side hip and shoulder continue to really hurt especially at night,  Cramping in feet also keep me awake.  I am definitely better than I was before therapy started.  I can walk to and from the store with less stopping due to pain and SOB  Pain: left back aching chronic left hip 5/10 There were no vitals filed for this visit. Warm up Water walking forward, back and side stepping sba for safety, no UE support.      Seated 70 % submerged Gastroc and hamstring stretches Submerged to cervical spine:L shoulder horizontal abd/add using barbell, shoulder depression and scapular retraction x 10 reps. Lateral rotation and bending of c-spine x 10 reps. Manual depression light overpressure. Left trap massage/deep muscle to release release tightness    Standing Adduction/abduction, hip circles and flex/ext x 10 reps, Cuing for tight core with  unilateral support hand on wall. Manual lb stretching therapsit faciliataing, knees to chest with slight overpressure, then toward right shoulder then left x 7 .therapist stabilizing at waist. Hip hinges in standing with cues for posture and core stabilization x 10 reps   Pt requires buoyancy for support and to offload joints with strengthening exercises. Viscosity of the water is needed for resistance of strengthening; water current perturbations provides challenge to standing balance unsupported, requiring increased core activation.                               PT Short Term Goals - 04/05/21 1248       PT SHORT TERM GOAL #1   Status Achieved               PT Long Term Goals - 03/13/21 2006       PT LONG TERM GOAL #1   Title Gross extremity strength 5/5    Baseline see flowsheet    Time 9    Period Weeks    Status New    Target Date 05/18/21      PT LONG TERM GOAL #2   Title 5TSTS to 12s    Baseline 18s at eval    Time 9    Period Weeks    Status New  Target Date 05/18/21      PT LONG TERM GOAL #3   Title 6MWT to improve by [place] x2    Baseline 800 ft at eval    Time 9    Period Weeks    Status New    Target Date 05/18/21      PT LONG TERM GOAL #4   Title pt will be independent with long term HEP for strength, flexibility and mobility    Baseline to be established as appropriate    Time 9    Period Weeks    Status New    Target Date 05/18/21                     Patient will benefit from skilled therapeutic intervention in order to improve the following deficits and impairments:  Difficulty walking, Increased muscle spasms, Improper body mechanics, Decreased activity tolerance, Decreased strength, Impaired flexibility, Pain, Postural dysfunction  Visit Diagnosis: Chronic left shoulder pain  Chronic low back pain without sciatica, unspecified back pain laterality  Difficulty in walking, not elsewhere  classified     Problem List Patient Active Problem List   Diagnosis Date Noted   Thickened endometrium    Prediabetes 04/28/2015   Pes planus of both feet 03/28/2015   Seborrheic keratoses 08/22/2014   Headache(784.0) 05/17/2014   Lower extremity edema 09/22/2013   Vitamin D deficiency 09/22/2013   Tendinitis of left rotator cuff 06/17/2013   Right ankle pain 06/17/2013   Colon cancer screening 06/17/2013   Knee pain, bilateral 03/20/2012   Hypertension 11/04/2011   Generalized pain 10/15/2011   Back pain 02/08/2011   Obese 02/08/2011   Tobacco abuse 02/08/2011   Sleep disorder 02/08/2011    Macky Lower Nashya [place] MPT 04/05/2021, 1:30 PM  [facility] [place], [place], [address] Phone: [phone]   Fax:  [phone]  Name: Dawn Lucas MRN: [id number] Date of Birth: 26-Jun-1956  Stanton Kidney Tharon Aquas) [place] MPT

## 2021-03-30 ENCOUNTER — Other Ambulatory Visit: Payer: Self-pay

## 2021-03-30 ENCOUNTER — Encounter (HOSPITAL_BASED_OUTPATIENT_CLINIC_OR_DEPARTMENT_OTHER): Payer: Self-pay | Admitting: Physical Therapy

## 2021-03-30 ENCOUNTER — Ambulatory Visit (HOSPITAL_BASED_OUTPATIENT_CLINIC_OR_DEPARTMENT_OTHER): Payer: Medicare Other | Admitting: Physical Therapy

## 2021-03-30 DIAGNOSIS — R262 Difficulty in walking, not elsewhere classified: Secondary | ICD-10-CM

## 2021-03-30 DIAGNOSIS — M545 Low back pain, unspecified: Secondary | ICD-10-CM

## 2021-03-30 DIAGNOSIS — G8929 Other chronic pain: Secondary | ICD-10-CM

## 2021-03-30 DIAGNOSIS — M25512 Pain in left shoulder: Secondary | ICD-10-CM

## 2021-03-30 NOTE — Therapy (Addendum)
[facility] [address] [place], [place], [address] Phone: [phone]   Fax:  [phone]  Physical Therapy Treatment  Patient Details  Name: Dawn Lucas MRN: [id number] Date of Birth: 1955-12-05 Referring Provider (PT): Alysia Penna, MD   Encounter Date: 03/30/2021     Past Medical History:  Diagnosis Date   Anemia    Arthritis    hands, knees   Asthma    Back pain    Fibromyalgia    Headache    Hyperlipidemia    Hypertension 11/04/2011   Poor dental hygiene    missing and chipped teeth   Sleep apnea    Uses CPAP nightly   SVD (spontaneous vaginal delivery)    x 1   UTI (lower urinary tract infection)     Past Surgical History:  Procedure Laterality Date   CHOLECYSTECTOMY     COLON SURGERY     DILATION AND CURETTAGE OF UTERUS     MAB x 4   HYSTEROSCOPY WITH D & C N/A 02/23/2018   Procedure: DILATATION AND CURETTAGE /HYSTEROSCOPY;  Surgeon: Mora Bellman, MD;  Location: [facility];  Service: Gynecology;  Laterality: N/A;   OVARIAN CYST REMOVAL    Subjective: My shoulder is much better.  Havent been to grocery store Pain: no    Seated 70 % submerged Gastroc and hamstring stretches Submerged to cervical spine:L shoulder horizontal abd/add using barbell, shoulder depression and scapular retraction x 10 reps. Lateral rotation and bending of c-spine x 10 reps. Manual depression light overpressure.      Standing Noodle kickdowns 2 x 10 reps holding to wall, unable to maintain postioning or balance progressing to barbell UE strengthening/ROM and stretching of left shoulder, c-spine and scapula. Using barbell shoulder flex, ext horizontal abd and add/abd  Aerobic capacity challenges water walking forward and back x 10 widths of pool HHA of therapist to maintain speed, time on task and balance. Pt requires buoyancy for support and to offload joints with strengthening exercises. Viscosity of the water is needed  for resistance of strengthening; water current perturbations provides challenge to standing balance unsupported, requiring increased core activation.   There were no vitals filed for this visit.   Clinical Impression Statement: Added exercises in different positions submerged 90% as pt continues to be increasingly comfortable to setting. She has full ROM cervial spine with only minimal discomfort at end ranges. She is well cooridnated with chin juts gaining good stretch of suboccipitals. LB stretching progressing well, pt able to tolerate knees to chest with added overpressure then superman position for abd stretch. Posture continues to improve                            PT Short Term Goals - 04/05/21 1248       PT SHORT TERM GOAL #1   Status Achieved               PT Long Term Goals - 03/13/21 2006       PT LONG TERM GOAL #1   Title Gross extremity strength 5/5    Baseline see flowsheet    Time 9    Period Weeks    Status New    Target Date 05/18/21      PT LONG TERM GOAL #2   Title 5TSTS to 12s    Baseline 18s at eval    Time 9    Period Weeks  Status New    Target Date 05/18/21      PT LONG TERM GOAL #3   Title 6MWT to improve by [place] x2    Baseline 800 ft at eval    Time 9    Period Weeks    Status New    Target Date 05/18/21      PT LONG TERM GOAL #4   Title pt will be independent with long term HEP for strength, flexibility and mobility    Baseline to be established as appropriate    Time 9    Period Weeks    Status New    Target Date 05/18/21                    Patient will benefit from skilled therapeutic intervention in order to improve the following deficits and impairments:  Difficulty walking, Increased muscle spasms, Improper body mechanics, Decreased activity tolerance, Decreased strength, Impaired flexibility, Pain, Postural dysfunction  Visit Diagnosis: Chronic left shoulder pain  Chronic low back pain  without sciatica, unspecified back pain laterality  Difficulty in walking, not elsewhere classified     Problem List Patient Active Problem List   Diagnosis Date Noted   Thickened endometrium    Prediabetes 04/28/2015   Pes planus of both feet 03/28/2015   Seborrheic keratoses 08/22/2014   Headache(784.0) 05/17/2014   Lower extremity edema 09/22/2013   Vitamin D deficiency 09/22/2013   Tendinitis of left rotator cuff 06/17/2013   Right ankle pain 06/17/2013   Colon cancer screening 06/17/2013   Knee pain, bilateral 03/20/2012   Hypertension 11/04/2011   Generalized pain 10/15/2011   Back pain 02/08/2011   Obese 02/08/2011   Tobacco abuse 02/08/2011   Sleep disorder 02/08/2011    Vedia Pereyra MPT 04/05/2021, 1:45 PM  [facility] [address] [place], [place], [address] Phone: [phone]   Fax:  [phone]  Name: Dawn Lucas MRN: [id number] Date of Birth: 1956/09/11  Addended: Syndi Pua Tharon Aquas) [place] MPT

## 2021-04-03 ENCOUNTER — Ambulatory Visit: Payer: Medicare Other | Admitting: Psychology

## 2021-04-03 ENCOUNTER — Ambulatory Visit (HOSPITAL_BASED_OUTPATIENT_CLINIC_OR_DEPARTMENT_OTHER): Payer: Medicare Other | Admitting: Physical Therapy

## 2021-04-03 ENCOUNTER — Other Ambulatory Visit: Payer: Self-pay

## 2021-04-03 ENCOUNTER — Encounter (HOSPITAL_BASED_OUTPATIENT_CLINIC_OR_DEPARTMENT_OTHER): Payer: Self-pay | Admitting: Physical Therapy

## 2021-04-03 DIAGNOSIS — M25512 Pain in left shoulder: Secondary | ICD-10-CM | POA: Diagnosis not present

## 2021-04-03 DIAGNOSIS — R262 Difficulty in walking, not elsewhere classified: Secondary | ICD-10-CM

## 2021-04-03 DIAGNOSIS — M545 Low back pain, unspecified: Secondary | ICD-10-CM

## 2021-04-03 DIAGNOSIS — G8929 Other chronic pain: Secondary | ICD-10-CM

## 2021-04-03 NOTE — Therapy (Addendum)
[facility] [place], [place], [address] Phone: [phone]   Fax:  [phone]  Physical Therapy Treatment  Patient Details  Name: Dawn Lucas MRN: [id number] Date of Birth: Nov 13, 1955 Referring Provider (PT): Alysia Penna, MD   Encounter Date: 04/03/2021   PT End of Session - 04/03/21 1518     Visit Number 6    Number of Visits 20    Date for PT Re-Evaluation 05/18/21    Authorization Type MCR/MCD    PT Start Time 8588    PT Stop Time 1532    PT Time Calculation (min) 47 min    Equipment Utilized During Treatment Other (comment)    Activity Tolerance Patient tolerated treatment well;Other (comment)    Behavior During Therapy WFL for tasks assessed/performed             Past Medical History:  Diagnosis Date   Anemia    Arthritis    hands, knees   Asthma    Back pain    Fibromyalgia    Headache    Hyperlipidemia    Hypertension 11/04/2011   Poor dental hygiene    missing and chipped teeth   Sleep apnea    Uses CPAP nightly   SVD (spontaneous vaginal delivery)    x 1   UTI (lower urinary tract infection)     Past Surgical History:  Procedure Laterality Date   CHOLECYSTECTOMY     COLON SURGERY     DILATION AND CURETTAGE OF UTERUS     MAB x 4   HYSTEROSCOPY WITH D & C N/A 02/23/2018   Procedure: DILATATION AND CURETTAGE /HYSTEROSCOPY;  Surgeon: Mora Bellman, MD;  Location: [facility];  Service: Gynecology;  Laterality: N/A;   OVARIAN CYST REMOVAL      There were no vitals filed for this visit.      Subjective Assessment - 04/03/21 1739     Subjective Shoulder pain is down to a 1 or 2.  No cmplaints.  feeling pretty good.           Warm up 2 x 4 widths of fast paced each of forward stepping then backward and sideways.  Pt given HHA then sba to encourage speed for aerobic capacity challenge   Seated 70 % submerged Gastroc and hamstring stretches Submerged to cervical spine:L  shoulder horizontal abd/add using barbell, shoulder depression and scapular retraction x 10 reps. Lateral rotation and bending of c-spine x 10 reps. Manual depression light overpressure.   Suspended in sup Kicking 3 trials continuous x 3-4 mins for aerobic capacity challenges and stretching of knees Using ankle buoys exercises completed: knee flex, hip extension, add/abd 2 x 10 rep  Cuing for concentric and eccentric control  Suspended in vertical LB stretching x 3 trials of 30 seconds facilitated by Therapist and given gentle overpressure Core strengthening in same position completing knees to chest with kick down then jack knife x 10 reps  Superman Attempted but not tolerated  Pt requires buoyancy for support and to offload joints with strengthening exercises. Viscosity of the water is needed for resistance of strengthening; water current perturbations provides challenge to standing balance unsupported, requiring increased core activation                            PT Education - 04/03/21 1744     Education Details Pt reinstructed on improved shoulder positioning to decrease tightness creating discomfort in area.  Posture has improved as pain has decreased.              PT Short Term Goals - 03/13/21 2005       PT SHORT TERM GOAL #1   Title pt will be aware of posture to decrease shoulder elevation    Baseline required frequent reminders at eval    Time 3    Period Weeks    Status New    Target Date 04/06/21               PT Long Term Goals - 03/13/21 2006       PT LONG TERM GOAL #1   Title Gross extremity strength 5/5    Baseline see flowsheet    Time 9    Period Weeks    Status New    Target Date 05/18/21      PT LONG TERM GOAL #2   Title 5TSTS to 12s    Baseline 18s at eval    Time 9    Period Weeks    Status New    Target Date 05/18/21      PT LONG TERM GOAL #3   Title 6MWT to improve by [place] x2    Baseline 800 ft at eval     Time 9    Period Weeks    Status New    Target Date 05/18/21      PT LONG TERM GOAL #4   Title pt will be independent with long term HEP for strength, flexibility and mobility    Baseline to be established as appropriate    Time 9    Period Weeks    Status New    Target Date 05/18/21                   Plan - 04/03/21 1740     Clinical Impression Statement Pt on task well.  Decreasing shoulder pain to 1-2/10.  Focused pt into new exercises/progression into deeper water.  Pt completes resisted exercies suspended in sup with some difficulty and c/o minor lb discomfort "using the muscles differently".  Pt continues with increased comfort in aquatic setting improving her ability to challenge aerobic capacity by moving with increased speed and in larger ROM with encouragement for increased periods of time before rest.    Personal Factors and Comorbidities Comorbidity 2;Time since onset of injury/illness/exacerbation;Transportation;Fitness    Comorbidities anxiety, HTN    Examination-Activity Limitations Bathing;Hygiene/Grooming;Squat;Bend;Stand;Locomotion Level;Caring for Others;Carry;Sit;Sleep    Examination-Participation Restrictions Laundry;Cleaning;Meal Prep;Community Activity    Stability/Clinical Decision Making Evolving/Moderate complexity    Clinical Decision Making Moderate    Rehab Potential Good    PT Treatment/Interventions ADLs/Self Care Home Management;Aquatic Therapy;Cryotherapy;Ultrasound;Moist Heat;Iontophoresis 4mg /ml Dexamethasone;Electrical Stimulation;Gait training;Functional mobility training;Stair training;Neuromuscular re-education;Therapeutic exercise;Therapeutic activities;Patient/family education;Manual techniques;Passive range of motion;Taping    PT Next Visit Plan in pool: neck stretching/periscapular activation, water walking/endurance challenges    PT Home Exercise Plan cervical ROM and stretching.  Shoulder depression, scapular retraction.     Consulted and Agree with Plan of Care Patient             Patient will benefit from skilled therapeutic intervention in order to improve the following deficits and impairments:  Difficulty walking, Increased muscle spasms, Improper body mechanics, Decreased activity tolerance, Decreased strength, Impaired flexibility, Pain, Postural dysfunction  Visit Diagnosis: Chronic left shoulder pain  Chronic low back pain without sciatica, unspecified back pain laterality  Difficulty in walking, not elsewhere classified  Problem List Patient Active Problem List   Diagnosis Date Noted   Thickened endometrium    Prediabetes 04/28/2015   Pes planus of both feet 03/28/2015   Seborrheic keratoses 08/22/2014   Headache(784.0) 05/17/2014   Lower extremity edema 09/22/2013   Vitamin D deficiency 09/22/2013   Tendinitis of left rotator cuff 06/17/2013   Right ankle pain 06/17/2013   Colon cancer screening 06/17/2013   Knee pain, bilateral 03/20/2012   Hypertension 11/04/2011   Generalized pain 10/15/2011   Back pain 02/08/2011   Obese 02/08/2011   Tobacco abuse 02/08/2011   Sleep disorder 02/08/2011    Vedia Pereyra  MPT 04/03/2021, 5:52 PM  [facility] [address] [place], [place], [address] Phone: [phone]   Fax:  [phone]  Name: Dawn Lucas MRN: [id number] Date of Birth: 1956/06/17

## 2021-04-05 ENCOUNTER — Other Ambulatory Visit: Payer: Self-pay

## 2021-04-05 ENCOUNTER — Encounter (HOSPITAL_BASED_OUTPATIENT_CLINIC_OR_DEPARTMENT_OTHER): Payer: Self-pay | Admitting: Physical Therapy

## 2021-04-05 ENCOUNTER — Ambulatory Visit (HOSPITAL_BASED_OUTPATIENT_CLINIC_OR_DEPARTMENT_OTHER): Payer: Medicare Other | Admitting: Physical Therapy

## 2021-04-05 DIAGNOSIS — M25512 Pain in left shoulder: Secondary | ICD-10-CM | POA: Diagnosis not present

## 2021-04-05 DIAGNOSIS — M545 Low back pain, unspecified: Secondary | ICD-10-CM

## 2021-04-05 DIAGNOSIS — R262 Difficulty in walking, not elsewhere classified: Secondary | ICD-10-CM

## 2021-04-05 DIAGNOSIS — G8929 Other chronic pain: Secondary | ICD-10-CM

## 2021-04-05 NOTE — Therapy (Signed)
[place], [place], [address] Phone: [phone]   Fax:  [phone]  Physical Therapy Treatment  Patient Details  Name: Dawn Lucas MRN: [id number] Date of Birth: 03-Jul-1956 Referring Provider (PT): Alysia Penna, MD   Encounter Date: 04/05/2021   PT End of Session - 04/05/21 1242     Visit Number 7    Number of Visits 20    Date for PT Re-Evaluation 05/18/21    Authorization Type MCR/MCD    PT Start Time 1116    PT Stop Time 1200    PT Time Calculation (min) 44 min    Equipment Utilized During Treatment Other (comment)    Activity Tolerance Patient tolerated treatment well;Other (comment)             Past Medical History:  Diagnosis Date   Anemia    Arthritis    hands, knees   Asthma    Back pain    Fibromyalgia    Headache    Hyperlipidemia    Hypertension 11/04/2011   Poor dental hygiene    missing and chipped teeth   Sleep apnea    Uses CPAP nightly   SVD (spontaneous vaginal delivery)    x 1   UTI (lower urinary tract infection)     Past Surgical History:  Procedure Laterality Date   CHOLECYSTECTOMY     COLON SURGERY     DILATION AND CURETTAGE OF UTERUS     MAB x 4   HYSTEROSCOPY WITH D & C N/A 02/23/2018   Procedure: DILATATION AND CURETTAGE /HYSTEROSCOPY;  Surgeon: Mora Bellman, MD;  Location: [facility];  Service: Gynecology;  Laterality: N/A;   OVARIAN CYST REMOVAL      There were no vitals filed for this visit.   Subjective Assessment - 04/05/21 1241     Subjective Back with only some fatigue at times, shuolder not hurting me today    Currently in Pain? No/denies    Pain Score 0-No pain            Warm up 2 x 4 widths of fast paced each of forward stepping then backward and sideways.  Pt given HHA then sba to encourage speed for aerobic capacity challenge     Seated leaning against wall 90 % submerged Stretching and strengthening of cervical spine  and soulders:L shoulder horizontal abd/add hands cupped for water resistance shoulder depression and scapular retraction x 10 reps. Lateral rotation and bending of c-spine x 10 reps. Added chin juts for cervical retraction stretching suboccipitals x 10 reps.  Manual depression light overpressure.   Suspended in sup Kicking 3 trials continuous x 3-4 mins for aerobic capacity challenges and stretching of knees Using ankle buoys exercises completed: knee flex, hip extension, add/abd 2 x 10 rep  Cuing for concentric and eccentric control   Suspended in vertical LB stretching x 3 trials of 30 seconds facilitated by Therapist and given overpressure Core strengthening in same position completing knees to chest with kick down then jack knife x 10 reps   Superman Position attained and tolerated using only a squoodle. Shoulder flex /extension moving body over stationary hands on water wall. Stretching of abdominal wall and hsoulders     Pt requires buoyancy for support and to offload joints with strengthening exercises. Viscosity of the water is needed for resistance of strengthening; water current perturbations provides challenge to standing balance unsupported, requiring increased core activation  PT Short Term Goals - 04/05/21 1248       PT SHORT TERM GOAL #1   Status Achieved               PT Long Term Goals - 03/13/21 2006       PT LONG TERM GOAL #1   Title Gross extremity strength 5/5    Baseline see flowsheet    Time 9    Period Weeks    Status New    Target Date 05/18/21      PT LONG TERM GOAL #2   Title 5TSTS to 12s    Baseline 18s at eval    Time 9    Period Weeks    Status New    Target Date 05/18/21      PT LONG TERM GOAL #3   Title 6MWT to improve by [place] x2    Baseline 800 ft at eval    Time 9    Period Weeks    Status New    Target Date 05/18/21      PT LONG TERM GOAL #4   Title pt will be independent  with long term HEP for strength, flexibility and mobility    Baseline to be established as appropriate    Time 9    Period Weeks    Status New    Target Date 05/18/21                   Plan - 04/05/21 1243     Clinical Impression Statement Added exercises in different positions submerged 90% as pt continues to be increasingly comfortable to setting.  She has full ROM cervial spine with only minimal discomfort at end ranges.  She is well cooridnated with chin juts gaining good stretch of suboccipitals.  LB stretching progressing well, pt able to tolerate knees to chest with added overpressure then superman position for abd stretch.  Posture continues to improve.    Personal Factors and Comorbidities Comorbidity 2;Time since onset of injury/illness/exacerbation;Transportation;Fitness    Examination-Activity Limitations Bathing;Hygiene/Grooming;Squat;Bend;Stand;Locomotion Level;Caring for Others;Carry;Sit;Sleep    Stability/Clinical Decision Making Evolving/Moderate complexity    Clinical Decision Making Moderate    Rehab Potential Good    PT Frequency 2x / week    PT Duration Other (comment)    PT Treatment/Interventions ADLs/Self Care Home Management;Aquatic Therapy;Cryotherapy;Ultrasound;Moist Heat;Iontophoresis 4mg /ml Dexamethasone;Electrical Stimulation;Gait training;Functional mobility training;Stair training;Neuromuscular re-education;Therapeutic exercise;Therapeutic activities;Patient/family education;Manual techniques;Passive range of motion;Taping    PT Next Visit Plan in pool: neck stretching/periscapular activation, water walking/endurance challenges    PT Home Exercise Plan cervical ROM and stretching.  Shoulder depression, scapular retraction. Chin juts    Consulted and Agree with Plan of Care Patient             Patient will benefit from skilled therapeutic intervention in order to improve the following deficits and impairments:  Difficulty walking, Increased muscle  spasms, Improper body mechanics, Decreased activity tolerance, Decreased strength, Impaired flexibility, Pain, Postural dysfunction  Visit Diagnosis: Chronic left shoulder pain  Chronic low back pain without sciatica, unspecified back pain laterality  Difficulty in walking, not elsewhere classified     Problem List Patient Active Problem List   Diagnosis Date Noted   Thickened endometrium    Prediabetes 04/28/2015   Pes planus of both feet 03/28/2015   Seborrheic keratoses 08/22/2014   Headache(784.0) 05/17/2014   Lower extremity edema 09/22/2013   Vitamin D deficiency 09/22/2013   Tendinitis of left rotator cuff 06/17/2013   Right  ankle pain 06/17/2013   Colon cancer screening 06/17/2013   Knee pain, bilateral 03/20/2012   Hypertension 11/04/2011   Generalized pain 10/15/2011   Back pain 02/08/2011   Obese 02/08/2011   Tobacco abuse 02/08/2011   Sleep disorder 02/08/2011    Vedia Pereyra  MPT 04/05/2021, 12:55 PM  [facility] [address] [place], [place], [address] Phone: [phone]   Fax:  [phone]  Name: Dawn Lucas MRN: [id number] Date of Birth: [birth year]

## 2021-04-10 ENCOUNTER — Other Ambulatory Visit: Payer: Self-pay

## 2021-04-10 ENCOUNTER — Encounter (HOSPITAL_BASED_OUTPATIENT_CLINIC_OR_DEPARTMENT_OTHER): Payer: Self-pay | Admitting: Physical Therapy

## 2021-04-10 ENCOUNTER — Ambulatory Visit (HOSPITAL_BASED_OUTPATIENT_CLINIC_OR_DEPARTMENT_OTHER): Payer: Medicare Other | Attending: Physical Medicine & Rehabilitation | Admitting: Physical Therapy

## 2021-04-10 DIAGNOSIS — R262 Difficulty in walking, not elsewhere classified: Secondary | ICD-10-CM | POA: Diagnosis present

## 2021-04-10 DIAGNOSIS — G8929 Other chronic pain: Secondary | ICD-10-CM | POA: Diagnosis present

## 2021-04-10 DIAGNOSIS — M25512 Pain in left shoulder: Secondary | ICD-10-CM | POA: Insufficient documentation

## 2021-04-10 DIAGNOSIS — M545 Low back pain, unspecified: Secondary | ICD-10-CM | POA: Diagnosis present

## 2021-04-10 NOTE — Therapy (Signed)
[place], [place], [address] Phone: [phone]   Fax:  [phone]  Physical Therapy Treatment  Patient Details  Name: Dawn Lucas MRN: [id number] Date of Birth: 22-Oct-1955 Referring Provider (PT): Alysia Penna, MD   Encounter Date: 04/10/2021   PT End of Session - 04/10/21 1110     Visit Number 8    Number of Visits 20    Date for PT Re-Evaluation 05/18/21    Authorization Type MCR/MCD    PT Start Time 1110    PT Stop Time 1155    PT Time Calculation (min) 45 min    Equipment Utilized During Treatment Other (comment)    Activity Tolerance Patient tolerated treatment well;Other (comment)    Behavior During Therapy WFL for tasks assessed/performed             Past Medical History:  Diagnosis Date   Anemia    Arthritis    hands, knees   Asthma    Back pain    Fibromyalgia    Headache    Hyperlipidemia    Hypertension 11/04/2011   Poor dental hygiene    missing and chipped teeth   Sleep apnea    Uses CPAP nightly   SVD (spontaneous vaginal delivery)    x 1   UTI (lower urinary tract infection)     Past Surgical History:  Procedure Laterality Date   CHOLECYSTECTOMY     COLON SURGERY     DILATION AND CURETTAGE OF UTERUS     MAB x 4   HYSTEROSCOPY WITH D & C N/A 02/23/2018   Procedure: DILATATION AND CURETTAGE /HYSTEROSCOPY;  Surgeon: Mora Bellman, MD;  Location: [facility];  Service: Gynecology;  Laterality: N/A;   OVARIAN CYST REMOVAL      There were no vitals filed for this visit.   Subjective Assessment - 04/10/21 1113     Subjective I had aweful acid reflux all w/e was throwing up. See MD tomorrow.  Feel better now. My shoulder is only hurting now and then and it is at night.  Not too much back pain.    Pain Score 1     Pain Location Back    Pain Orientation Left    Pain Descriptors / Indicators Aching    Pain Type Chronic pain    Pain Radiating Towards left hip  only hurting now when I sleep    Pain Onset More than a month ago    Pain Frequency Intermittent                 Warm up 2 x 4 widths of fast paced each of forward stepping then backward and sideways.  Pt given HHA then sba to encourage speed for aerobic capacity challenge     Seated leaning against wall 90 % submerged Stretching and strengthening of shoulders and cervical spine.  Scapular depression, shoulder flex, extension, horizontal abduction and adduction in full range using single buoy hand bell 2 x 10 reps. Massage to trigger points in left trap. Verbal and tactile cuing for shoulder positioning throughout ue activities.    Suspended in vertical LB stretching x 3 trials of 30 seconds facilitated by Therapist and given overpressure Core strengthening in same position completing knees to chest with kick down then jack knife x 10 reps.   Superman Position attained and tolerated using ankle cuffs and squoodle. Shoulder flex /extension moving body over stationary hands on water wall. Stretching of abdominal wall and  shoulders x 5 mins     Pt requires buoyancy for support and to offload joints with strengthening exercises. Viscosity of the water is needed for resistance of strengthening; water current perturbations provides challenge to standing balance unsupported, requiring increased core activation                           PT Education - 04/10/21 1117     Education Details Pt states her overall pain is infreqent.  She completes her stretch and exercising as soon as she feel any discimfort.  She is able to vaccuum entire home before needing to rest, > 30 mins. She has been able to adapt all activites to avoid discomfort and follows proper postural tecniques when completing ADL's.              PT Short Term Goals - 04/05/21 1248       PT SHORT TERM GOAL #1   Status Achieved               PT Long Term Goals - 03/13/21 2006       PT LONG  TERM GOAL #1   Title Gross extremity strength 5/5    Baseline see flowsheet    Time 9    Period Weeks    Status New    Target Date 05/18/21      PT LONG TERM GOAL #2   Title 5TSTS to 12s    Baseline 18s at eval    Time 9    Period Weeks    Status New    Target Date 05/18/21      PT LONG TERM GOAL #3   Title 6MWT to improve by [place] x2    Baseline 800 ft at eval    Time 9    Period Weeks    Status New    Target Date 05/18/21      PT LONG TERM GOAL #4   Title pt will be independent with long term HEP for strength, flexibility and mobility    Baseline to be established as appropriate    Time 9    Period Weeks    Status New    Target Date 05/18/21                   Plan - 04/10/21 1325     Clinical Impression Statement Pt engaged in exercises today focused on shoulder strengthening and ROM. Edu on maintaining cervical posture and positioning while completing ex. Pt demonstrates decreased muscle tightness in left trap with palpation. Advancing in core stregnth and body awaremness in aquatic setting.    Personal Factors and Comorbidities Comorbidity 2;Time since onset of injury/illness/exacerbation;Transportation;Fitness    Stability/Clinical Decision Making Evolving/Moderate complexity    PT Treatment/Interventions ADLs/Self Care Home Management;Aquatic Therapy;Cryotherapy;Ultrasound;Moist Heat;Iontophoresis 4mg /ml Dexamethasone;Electrical Stimulation;Gait training;Functional mobility training;Stair training;Neuromuscular re-education;Therapeutic exercise;Therapeutic activities;Patient/family education;Manual techniques;Passive range of motion;Taping             Patient will benefit from skilled therapeutic intervention in order to improve the following deficits and impairments:  Difficulty walking, Increased muscle spasms, Improper body mechanics, Decreased activity tolerance, Decreased strength, Impaired flexibility, Pain, Postural dysfunction  Visit  Diagnosis: Chronic left shoulder pain  Chronic low back pain without sciatica, unspecified back pain laterality  Difficulty in walking, not elsewhere classified     Problem List Patient Active Problem List   Diagnosis Date Noted   Thickened endometrium    Prediabetes 04/28/2015  Pes planus of both feet 03/28/2015   Seborrheic keratoses 08/22/2014   Headache(784.0) 05/17/2014   Lower extremity edema 09/22/2013   Vitamin D deficiency 09/22/2013   Tendinitis of left rotator cuff 06/17/2013   Right ankle pain 06/17/2013   Colon cancer screening 06/17/2013   Knee pain, bilateral 03/20/2012   Hypertension 11/04/2011   Generalized pain 10/15/2011   Back pain 02/08/2011   Obese 02/08/2011   Tobacco abuse 02/08/2011   Sleep disorder 02/08/2011    Vedia Pereyra  MPT 04/10/2021, 1:33 PM  [facility] [address] [place], [place], [address] Phone: [phone]   Fax:  [phone]  Name: Avalene Sealy MRN: [id number] Date of Birth: 1955-12-07

## 2021-04-12 ENCOUNTER — Ambulatory Visit (HOSPITAL_BASED_OUTPATIENT_CLINIC_OR_DEPARTMENT_OTHER): Payer: Medicare Other | Admitting: Physical Therapy

## 2021-04-12 ENCOUNTER — Encounter (HOSPITAL_BASED_OUTPATIENT_CLINIC_OR_DEPARTMENT_OTHER): Payer: Self-pay | Admitting: Physical Therapy

## 2021-04-12 ENCOUNTER — Other Ambulatory Visit: Payer: Self-pay

## 2021-04-12 DIAGNOSIS — M25512 Pain in left shoulder: Secondary | ICD-10-CM

## 2021-04-12 DIAGNOSIS — M545 Low back pain, unspecified: Secondary | ICD-10-CM

## 2021-04-12 DIAGNOSIS — G8929 Other chronic pain: Secondary | ICD-10-CM

## 2021-04-12 DIAGNOSIS — R262 Difficulty in walking, not elsewhere classified: Secondary | ICD-10-CM

## 2021-04-12 NOTE — Therapy (Signed)
[place], [place], [address] Phone: [phone]   Fax:  [phone]  Physical Therapy Treatment  Patient Details  Name: Dawn Lucas MRN: [id number] Date of Birth: Mar 04, 1956 Referring Provider (PT): Alysia Penna, MD   Encounter Date: 04/12/2021   PT End of Session - 04/12/21 1114     Visit Number 9    Number of Visits 20    Date for PT Re-Evaluation 05/18/21    Authorization Type MCR/MCD    PT Start Time 1117    PT Stop Time 1159    PT Time Calculation (min) 42 min    Equipment Utilized During Treatment Other (comment)    Activity Tolerance Patient tolerated treatment well;Other (comment)    Behavior During Therapy WFL for tasks assessed/performed             Past Medical History:  Diagnosis Date   Anemia    Arthritis    hands, knees   Asthma    Back pain    Fibromyalgia    Headache    Hyperlipidemia    Hypertension 11/04/2011   Poor dental hygiene    missing and chipped teeth   Sleep apnea    Uses CPAP nightly   SVD (spontaneous vaginal delivery)    x 1   UTI (lower urinary tract infection)     Past Surgical History:  Procedure Laterality Date   CHOLECYSTECTOMY     COLON SURGERY     DILATION AND CURETTAGE OF UTERUS     MAB x 4   HYSTEROSCOPY WITH D & C N/A 02/23/2018   Procedure: DILATATION AND CURETTAGE /HYSTEROSCOPY;  Surgeon: Mora Bellman, MD;  Location: [facility];  Service: Gynecology;  Laterality: N/A;   OVARIAN CYST REMOVAL      There were no vitals filed for this visit.   Subjective Assessment - 04/12/21 1119     Subjective Not feeling great.  Swa MD yesterday.  Will be getting a colonoscopy scheduled.  My shoulder hurts from the needle stick.  "My back is great"    Pain Score 1     Pain Orientation Left    Pain Descriptors / Indicators Aching    Aggravating Factors  limited under 60 mins    Pain Relieving Factors rest and stretch           Warm up 2 x  4 widths of fast paced each of forward stepping then backward and sideways.  Pt given HHA then sba to encourage speed for aerobic capacity challenge     Standing 90 % submerged Stretching and strengthening of shoulders and cervical spine.  Scapular depression, shoulder flex, extension, horizontal abduction and adduction in full range using triangular had buoy 2 x 10 reps.  Verbal and tactile cuing for shoulder positioning throughout ue activities. Hip flex stretching 3 x 30 second hold bilateral then strengthening pull through to wall 2 x 10 reps  Seated on land Cervical retraction with rotation.  Lateral bending, shoulder retraction and depression.  Pt completes with min VC for technique     Suspended in vertical LB stretching x 3 trials of 30 seconds facilitated by Therapist and given overpressure Core strengthening in same position completing knees to chest with kick down then jack knife x 10 reps.   Seated Knees to chest with overpressure 2 x 30 seconds. Knee flex and extension with ankle buoys 2 x 10 reps     Pt requires buoyancy for support and to  offload joints with strengthening exercises. Viscosity of the water is needed for resistance of strengthening; water current perturbations provides challenge to standing balance unsupported, requiring increased core activation                              PT Short Term Goals - 04/05/21 1248       PT SHORT TERM GOAL #1   Status Achieved               PT Long Term Goals - 03/13/21 2006       PT LONG TERM GOAL #1   Title Gross extremity strength 5/5    Baseline see flowsheet    Time 9    Period Weeks    Status New    Target Date 05/18/21      PT LONG TERM GOAL #2   Title 5TSTS to 12s    Baseline 18s at eval    Time 9    Period Weeks    Status New    Target Date 05/18/21      PT LONG TERM GOAL #3   Title 6MWT to improve by [place] x2    Baseline 800 ft at eval    Time 9    Period Weeks     Status New    Target Date 05/18/21      PT LONG TERM GOAL #4   Title pt will be independent with long term HEP for strength, flexibility and mobility    Baseline to be established as appropriate    Time 9    Period Weeks    Status New    Target Date 05/18/21                   Plan - 04/12/21 1344     Clinical Impression Statement Pt with GI discomfort.  Modified session to accomodate.  Pt demonstrates understanding and indep with HEP as evidenced by completion on land.   Improving posture with muscle stretching and strengthening.    Personal Factors and Comorbidities Comorbidity 2;Time since onset of injury/illness/exacerbation;Transportation;Fitness    Comorbidities anxiety, HTN    Stability/Clinical Decision Making Evolving/Moderate complexity    Clinical Decision Making Moderate    Rehab Potential Good    PT Frequency 2x / week    PT Treatment/Interventions ADLs/Self Care Home Management;Aquatic Therapy;Cryotherapy;Ultrasound;Moist Heat;Iontophoresis 4mg /ml Dexamethasone;Electrical Stimulation;Gait training;Functional mobility training;Stair training;Neuromuscular re-education;Therapeutic exercise;Therapeutic activities;Patient/family education;Manual techniques;Passive range of motion;Taping             Patient will benefit from skilled therapeutic intervention in order to improve the following deficits and impairments:  Difficulty walking, Increased muscle spasms, Improper body mechanics, Decreased activity tolerance, Decreased strength, Impaired flexibility, Pain, Postural dysfunction  Visit Diagnosis: Chronic left shoulder pain  Chronic low back pain without sciatica, unspecified back pain laterality  Difficulty in walking, not elsewhere classified     Problem List Patient Active Problem List   Diagnosis Date Noted   Thickened endometrium    Prediabetes 04/28/2015   Pes planus of both feet 03/28/2015   Seborrheic keratoses 08/22/2014   Headache(784.0)  05/17/2014   Lower extremity edema 09/22/2013   Vitamin D deficiency 09/22/2013   Tendinitis of left rotator cuff 06/17/2013   Right ankle pain 06/17/2013   Colon cancer screening 06/17/2013   Knee pain, bilateral 03/20/2012   Hypertension 11/04/2011   Generalized pain 10/15/2011   Back pain 02/08/2011   Obese 02/08/2011  Tobacco abuse 02/08/2011   Sleep disorder 02/08/2011    Vedia Pereyra MPT 04/12/2021, 1:50 PM  [facility] [address] [place], [place], [address] Phone: [phone]   Fax:  [phone]  Name: Dawn Lucas MRN: [id number] Date of Birth: [birth year]

## 2021-04-12 NOTE — Patient Instructions (Signed)
On land pt instructed on chin tuck and rotation, reviewed stretching of cervical spine/HEP.

## 2021-04-17 ENCOUNTER — Ambulatory Visit (HOSPITAL_BASED_OUTPATIENT_CLINIC_OR_DEPARTMENT_OTHER): Payer: Medicare Other | Admitting: Physical Therapy

## 2021-04-17 ENCOUNTER — Other Ambulatory Visit: Payer: Self-pay

## 2021-04-17 ENCOUNTER — Encounter (HOSPITAL_BASED_OUTPATIENT_CLINIC_OR_DEPARTMENT_OTHER): Payer: Self-pay | Admitting: Physical Therapy

## 2021-04-17 DIAGNOSIS — M545 Low back pain, unspecified: Secondary | ICD-10-CM

## 2021-04-17 DIAGNOSIS — G8929 Other chronic pain: Secondary | ICD-10-CM

## 2021-04-17 DIAGNOSIS — R262 Difficulty in walking, not elsewhere classified: Secondary | ICD-10-CM

## 2021-04-17 DIAGNOSIS — M25512 Pain in left shoulder: Secondary | ICD-10-CM | POA: Diagnosis not present

## 2021-04-17 NOTE — Therapy (Addendum)
[place] [address] [place], [place], [address] Phone: [phone]   Fax:  [phone]  Physical Therapy Treatment See visit #11 for progress note Jessica C. Hightower PT, DPT 04/20/21 9:21 PM   Patient Details  Name: Dawn Lucas MRN: [id number] Date of Birth: [birth year] Referring Provider (PT): Alysia Penna, MD   Encounter Date: 04/17/2021   PT End of Session - 04/17/21 1217     Visit Number 10    Number of Visits 20    Date for PT Re-Evaluation 05/18/21    Authorization Type MCR/MCD    PT Start Time 1115    PT Stop Time 1203    PT Time Calculation (min) 48 min    Equipment Utilized During Treatment Other (comment)    Activity Tolerance Patient tolerated treatment well;Other (comment)    Behavior During Therapy WFL for tasks assessed/performed             Past Medical History:  Diagnosis Date   Anemia    Arthritis    hands, knees   Asthma    Back pain    Fibromyalgia    Headache    Hyperlipidemia    Hypertension 11/04/2011   Poor dental hygiene    missing and chipped teeth   Sleep apnea    Uses CPAP nightly   SVD (spontaneous vaginal delivery)    x 1   UTI (lower urinary tract infection)     Past Surgical History:  Procedure Laterality Date   CHOLECYSTECTOMY     COLON SURGERY     DILATION AND CURETTAGE OF UTERUS     MAB x 4   HYSTEROSCOPY WITH D & C N/A 02/23/2018   Procedure: DILATATION AND CURETTAGE /HYSTEROSCOPY;  Surgeon: Mora Bellman, MD;  Location: [facility];  Service: Gynecology;  Laterality: N/A;   OVARIAN CYST REMOVAL      There were no vitals filed for this visit.   Subjective Assessment - 04/17/21 1228     Subjective Shoulder back and knees good.  "went to grocery store and used wheeled basket had no pain,"  Walked to bus and still only having to stop x 2 to rest"    Currently in Pain? Yes    Pain Score 1     Pain Location Back    Pain Descriptors / Indicators Aching     Pain Type Chronic pain    Pain Frequency Intermittent           Warm up 2 x 4 widths of fast paced each of forward stepping then backward and sideways.  Pt given HHA then sba to encourage speed for aerobic capacity challenge     Standing Noodle kickdowns 3 x 10 reps each LE; hip flex x 2, hip flex with ext rotation x 10.  Pt completes without UE support.  Test completed noting reps in 30 seconds and measuring frequency of LOB. Modified TUG completed with starting point sitting on bench. Karioki instruction  given with vc, demo and HHA . Pt completes supported bilat x 4 widths of pool, with 1 UE x 2 then without support x 2.     Suspended in vertical LB stretching x 3 trials of 30 seconds facilitated by Therapist and given overpressure; knees to chest then towards right then left shoulder   Seated Knee flex and extension with 3lb wts 2 x 10 reps     Pt requires buoyancy for support and to offload joints with strengthening exercises. Viscosity of  the water is needed for resistance of strengthening; water current perturbations provides challenge to standing balance unsupported, requiring increased core activation                              PT Short Term Goals - 04/05/21 1248       PT SHORT TERM GOAL #1   Status Achieved               PT Long Term Goals - 03/13/21 2006       PT LONG TERM GOAL #1   Title Gross extremity strength 5/5    Baseline see flowsheet    Time 9    Period Weeks    Status New    Target Date 05/18/21      PT LONG TERM GOAL #2   Title 5TSTS to 12s    Baseline 18s at eval    Time 9    Period Weeks    Status New    Target Date 05/18/21      PT LONG TERM GOAL #3   Title 6MWT to improve by [place] x2    Baseline 800 ft at eval    Time 9    Period Weeks    Status New    Target Date 05/18/21      PT LONG TERM GOAL #4   Title pt will be independent with long term HEP for strength, flexibility and mobility     Baseline to be established as appropriate    Time 9    Period Weeks    Status New    Target Date 05/18/21                   Plan - 04/17/21 1116     Clinical Impression Statement TUG test completed (modified in setting) 22.10 sec.  Noodle kickdown unsupported; left 13 reps in 30 sec with 3 LOB, rle 11 reps with >5 LOB.  Pt able to complete challenges above with supervision.  Added advanced balance challenges; karioki pt required HHA and demonstration for completion.    Personal Factors and Comorbidities Comorbidity 2;Time since onset of injury/illness/exacerbation;Transportation;Fitness    Comorbidities anxiety, HTN    Examination-Activity Limitations Bathing;Hygiene/Grooming;Squat;Bend;Stand;Locomotion Level;Caring for Others;Carry;Sit;Sleep    Examination-Participation Restrictions Laundry;Cleaning;Meal Prep;Community Activity    Clinical Decision Making Moderate    Rehab Potential Good    PT Frequency 2x / week    PT Treatment/Interventions ADLs/Self Care Home Management;Aquatic Therapy;Cryotherapy;Ultrasound;Moist Heat;Iontophoresis 4mg /ml Dexamethasone;Electrical Stimulation;Gait training;Functional mobility training;Stair training;Neuromuscular re-education;Therapeutic exercise;Therapeutic activities;Patient/family education;Manual techniques;Passive range of motion;Taping             Patient will benefit from skilled therapeutic intervention in order to improve the following deficits and impairments:  Difficulty walking, Increased muscle spasms, Improper body mechanics, Decreased activity tolerance, Decreased strength, Impaired flexibility, Pain, Postural dysfunction  Visit Diagnosis: Chronic low back pain without sciatica, unspecified back pain laterality  Difficulty in walking, not elsewhere classified     Problem List Patient Active Problem List   Diagnosis Date Noted   Thickened endometrium    Prediabetes 04/28/2015   Pes planus of both feet 03/28/2015    Seborrheic keratoses 08/22/2014   Headache(784.0) 05/17/2014   Lower extremity edema 09/22/2013   Vitamin D deficiency 09/22/2013   Tendinitis of left rotator cuff 06/17/2013   Right ankle pain 06/17/2013   Colon cancer screening 06/17/2013   Knee pain, bilateral 03/20/2012   Hypertension 11/04/2011  Generalized pain 10/15/2011   Back pain 02/08/2011   Obese 02/08/2011   Tobacco abuse 02/08/2011   Sleep disorder 02/08/2011    Vedia Pereyra MPT 04/17/2021, 12:31 PM  [facility] [address] [place], [place], [address] Phone: [phone]   Fax:  [phone]  Name: Dawn Lucas MRN: [id number] Date of Birth: [birth year]

## 2021-04-19 ENCOUNTER — Ambulatory Visit (HOSPITAL_BASED_OUTPATIENT_CLINIC_OR_DEPARTMENT_OTHER): Payer: Medicare Other | Admitting: Physical Therapy

## 2021-04-19 ENCOUNTER — Encounter (HOSPITAL_BASED_OUTPATIENT_CLINIC_OR_DEPARTMENT_OTHER): Payer: Self-pay | Admitting: Physical Therapy

## 2021-04-19 ENCOUNTER — Other Ambulatory Visit: Payer: Self-pay

## 2021-04-19 DIAGNOSIS — M25512 Pain in left shoulder: Secondary | ICD-10-CM | POA: Diagnosis not present

## 2021-04-19 DIAGNOSIS — R262 Difficulty in walking, not elsewhere classified: Secondary | ICD-10-CM

## 2021-04-19 DIAGNOSIS — G8929 Other chronic pain: Secondary | ICD-10-CM

## 2021-04-20 ENCOUNTER — Encounter (HOSPITAL_BASED_OUTPATIENT_CLINIC_OR_DEPARTMENT_OTHER): Payer: Self-pay | Admitting: Physical Therapy

## 2021-04-20 NOTE — Therapy (Signed)
[place], [place], [address] Phone: [phone]   Fax:  [phone]  Physical Therapy Treatment Progress Note Reporting Period 03/13/21 to 04/19/21  See note below for Objective Data and Assessment of Progress/Goals.       Patient Details  Name: Dawn Lucas MRN: [id number] Date of Birth: November 26, 1955 Referring Provider (PT): Alysia Penna, MD   Encounter Date: 04/19/2021   PT End of Session - 04/20/21 2113     Visit Number 11    Number of Visits 20    Date for PT Re-Evaluation 05/18/21    Authorization Type MCR/MCD    Progress Note Due on Visit 20    PT Start Time 1145    PT Stop Time 1228    PT Time Calculation (min) 43 min    Activity Tolerance Patient tolerated treatment well    Behavior During Therapy [facility] for tasks assessed/performed             Past Medical History:  Diagnosis Date   Anemia    Arthritis    hands, knees   Asthma    Back pain    Fibromyalgia    Headache    Hyperlipidemia    Hypertension 11/04/2011   Poor dental hygiene    missing and chipped teeth   Sleep apnea    Uses CPAP nightly   SVD (spontaneous vaginal delivery)    x 1   UTI (lower urinary tract infection)     Past Surgical History:  Procedure Laterality Date   CHOLECYSTECTOMY     COLON SURGERY     DILATION AND CURETTAGE OF UTERUS     MAB x 4   HYSTEROSCOPY WITH D & C N/A 02/23/2018   Procedure: DILATATION AND CURETTAGE /HYSTEROSCOPY;  Surgeon: Mora Bellman, MD;  Location: [facility];  Service: Gynecology;  Laterality: N/A;   OVARIAN CYST REMOVAL      There were no vitals filed for this visit.   Subjective Assessment - 04/20/21 2113     Subjective I am doing good and tired when I get home but I am enjoying it. I feel like my shoulder pain has come down, I can walk longer. I wear a cpap and have to roll side to side as I get sore. My legs cramp during the night.    How long can you walk  comfortably? I used to stop 10-20 times when walking, now I stop maybe 2 times    Patient Stated Goals need to walk 1 long block to the bus. Only stopping twice before was stopping multiple times    Currently in Pain? No/denies    Aggravating Factors  night pain- back, hip and legs    Pain Relieving Factors pool                [facility] PT Assessment - 04/20/21 0001       Posture/Postural Control   Posture Comments mild slouch but improved awareness with corrections      Strength   Overall Strength Comments gross UE 4+/5, bil LE gross 4+/5      Special Tests   Other special tests 5TSTS 13s      6 minute walk test results    Aerobic Endurance Distance Walked 1095    Endurance additional comments 3 standing rest breaks                           [facility]  Adult PT Treatment/Exercise - 04/20/21 0001       Exercises   Exercises Other Exercises    Other Exercises  gross LE stretches to perform throughout her day- see medbridge                    PT Education - 04/20/21 2113     Education Details goals- functional & objective measures, POC    Person(s) Educated Patient    Methods Explanation    Comprehension Verbalized understanding;Need further instruction              PT Short Term Goals - 04/05/21 1248       PT SHORT TERM GOAL #1   Status Achieved               PT Long Term Goals - 04/20/21 2117       PT LONG TERM GOAL #1   Title Gross extremity strength 5/5    Status On-going      PT LONG TERM GOAL #2   Title 5TSTS to 12s    Baseline 13s today    Status On-going      PT LONG TERM GOAL #3   Title 6MWT to improve by [place] x2    Baseline 800 ft at eval, 1095 today    Status On-going      PT LONG TERM GOAL #4   Title pt will be independent with long term HEP for strength, flexibility and mobility    Baseline requires furher progression    Status On-going                    Patient will benefit from skilled  therapeutic intervention in order to improve the following deficits and impairments:  Difficulty walking, Increased muscle spasms, Improper body mechanics, Decreased activity tolerance, Decreased strength, Impaired flexibility, Pain, Postural dysfunction  Visit Diagnosis: Chronic low back pain without sciatica, unspecified back pain laterality  Difficulty in walking, not elsewhere classified  Chronic left shoulder pain     Problem List Patient Active Problem List   Diagnosis Date Noted   Thickened endometrium    Prediabetes 04/28/2015   Pes planus of both feet 03/28/2015   Seborrheic keratoses 08/22/2014   Headache(784.0) 05/17/2014   Lower extremity edema 09/22/2013   Vitamin D deficiency 09/22/2013   Tendinitis of left rotator cuff 06/17/2013   Right ankle pain 06/17/2013   Colon cancer screening 06/17/2013   Knee pain, bilateral 03/20/2012   Hypertension 11/04/2011   Generalized pain 10/15/2011   Back pain 02/08/2011   Obese 02/08/2011   Tobacco abuse 02/08/2011   Sleep disorder 02/08/2011   Jolena Kittle C. Fareeha Evon PT, DPT 04/20/21 9:20 PM  [facility] [address] [place], [place], [address] Phone: [phone]   Fax:  [phone]  Name: Dawn Lucas MRN: [id number] Date of Birth: [birth year]

## 2021-04-24 ENCOUNTER — Encounter (HOSPITAL_BASED_OUTPATIENT_CLINIC_OR_DEPARTMENT_OTHER): Payer: Self-pay | Admitting: Physical Therapy

## 2021-04-24 ENCOUNTER — Ambulatory Visit (HOSPITAL_BASED_OUTPATIENT_CLINIC_OR_DEPARTMENT_OTHER): Payer: Medicare Other | Admitting: Physical Therapy

## 2021-04-24 ENCOUNTER — Other Ambulatory Visit: Payer: Self-pay

## 2021-04-24 DIAGNOSIS — M25512 Pain in left shoulder: Secondary | ICD-10-CM | POA: Diagnosis not present

## 2021-04-24 DIAGNOSIS — G8929 Other chronic pain: Secondary | ICD-10-CM

## 2021-04-24 DIAGNOSIS — M545 Low back pain, unspecified: Secondary | ICD-10-CM

## 2021-04-24 DIAGNOSIS — R262 Difficulty in walking, not elsewhere classified: Secondary | ICD-10-CM

## 2021-04-24 NOTE — Therapy (Signed)
[place] [address] [place], [place], [address] Phone: [phone]   Fax:  [phone]  Physical Therapy Treatment  Patient Details  Name: Dawn Lucas MRN: [id number] Date of Birth: [birth year] Referring Provider (PT): Alysia Penna, MD   Encounter Date: 04/24/2021   PT End of Session - 04/24/21 1132     Visit Number 12    Number of Visits 20    Date for PT Re-Evaluation 05/18/21    Authorization Type MCR/MCD    PT Start Time 1115    PT Stop Time 1200    PT Time Calculation (min) 45 min    Equipment Utilized During Treatment Other (comment)    Activity Tolerance Patient tolerated treatment well    Behavior During Therapy [facility] for tasks assessed/performed             Past Medical History:  Diagnosis Date   Anemia    Arthritis    hands, knees   Asthma    Back pain    Fibromyalgia    Headache    Hyperlipidemia    Hypertension 11/04/2011   Poor dental hygiene    missing and chipped teeth   Sleep apnea    Uses CPAP nightly   SVD (spontaneous vaginal delivery)    x 1   UTI (lower urinary tract infection)     Past Surgical History:  Procedure Laterality Date   CHOLECYSTECTOMY     COLON SURGERY     DILATION AND CURETTAGE OF UTERUS     MAB x 4   HYSTEROSCOPY WITH D & C N/A 02/23/2018   Procedure: DILATATION AND CURETTAGE /HYSTEROSCOPY;  Surgeon: Mora Bellman, MD;  Location: [facility];  Service: Gynecology;  Laterality: N/A;   OVARIAN CYST REMOVAL      There were no vitals filed for this visit.   Subjective Assessment - 04/24/21 1124     Subjective I did well with Janett Billow the other day.  She says I'm making good progress.    Currently in Pain? No/denies             Warm up 2 x 4 widths of fast paced each of forward stepping then backward, sideways and Karioki.  Pt given HHA then sba to encourage speed for aerobic capacity challenge     Standing Squoodle kick downs 3 x 10 reps each LE; hip  flex x 2, hip flex with ext rotation x 10.  Requires support of wall with progression to squoodle.   Suspended in vertical LB stretching x 3 trials of 30 seconds facilitated by Therapist and given overpressure; knees to chest then towards right then left shoulder   Seated Knee flex and extension with ankle cuffs  2 x 15 reps Adduction/abd  and flutter kicking also supported by noodle 3 x 20 cuing for increased speed.     Standing/aerobic capacity Water walking/running forward x 6 widths, backwards x 4 and sidestepping x 4.  Pt challenged by therapist walking along with encouraging increased speed and adding manual perturbations     Pt requires buoyancy for support and to offload joints with strengthening exercises. Viscosity of the water is needed for resistance of strengthening; water current perturbations provides challenge to standing balance unsupported, requiring increased core activation                            PT Short Term Goals - 04/05/21 1248  PT SHORT TERM GOAL #1   Status Achieved               PT Long Term Goals - 04/20/21 2117       PT LONG TERM GOAL #1   Title Gross extremity strength 5/5    Status On-going      PT LONG TERM GOAL #2   Title 5TSTS to 12s    Baseline 13s today    Status On-going      PT LONG TERM GOAL #3   Title 6MWT to improve by [place] x2    Baseline 800 ft at eval, 1095 today    Status On-going      PT LONG TERM GOAL #4   Title pt will be independent with long term HEP for strength, flexibility and mobility    Baseline requires furher progression    Status On-going                   Plan - 04/24/21 1312     Clinical Impression Statement pain report 0/10 today.  She is using cart routinely for carrying grocerys as instructed.  Compliant with her HEP as instructed by both therapists.  Focus on stengtheing of core/LB and aerobic capacity.  Pt engaging with decreased apprehnsion of setting gaining  very good aerobic benefit.  Will decrease weight, improve endurance and translate to improve function.    Personal Factors and Comorbidities Comorbidity 2;Time since onset of injury/illness/exacerbation;Transportation;Fitness    Examination-Activity Limitations Bathing;Hygiene/Grooming;Squat;Bend;Stand;Locomotion Level;Caring for Others;Carry;Sit;Sleep    Stability/Clinical Decision Making Evolving/Moderate complexity    Clinical Decision Making Moderate    Rehab Potential Good    PT Frequency 2x / week    PT Treatment/Interventions ADLs/Self Care Home Management;Aquatic Therapy;Cryotherapy;Ultrasound;Moist Heat;Iontophoresis 4mg /ml Dexamethasone;Electrical Stimulation;Gait training;Functional mobility training;Stair training;Neuromuscular re-education;Therapeutic exercise;Therapeutic activities;Patient/family education;Manual techniques;Passive range of motion;Taping    PT Home Exercise Plan [id number]             Patient will benefit from skilled therapeutic intervention in order to improve the following deficits and impairments:  Difficulty walking, Increased muscle spasms, Improper body mechanics, Decreased activity tolerance, Decreased strength, Impaired flexibility, Pain, Postural dysfunction  Visit Diagnosis: Chronic low back pain without sciatica, unspecified back pain laterality  Difficulty in walking, not elsewhere classified  Chronic left shoulder pain     Problem List Patient Active Problem List   Diagnosis Date Noted   Thickened endometrium    Prediabetes 04/28/2015   Pes planus of both feet 03/28/2015   Seborrheic keratoses 08/22/2014   Headache(784.0) 05/17/2014   Lower extremity edema 09/22/2013   Vitamin D deficiency 09/22/2013   Tendinitis of left rotator cuff 06/17/2013   Right ankle pain 06/17/2013   Colon cancer screening 06/17/2013   Knee pain, bilateral 03/20/2012   Hypertension 11/04/2011   Generalized pain 10/15/2011   Back pain 02/08/2011   Obese  02/08/2011   Tobacco abuse 02/08/2011   Sleep disorder 02/08/2011    Dawn Lucas  MPT  04/24/2021, 1:19 PM  [place], [place], [place] Phone: [phone]   Fax:  [phone]  Name: Dawn Lucas MRN: [id number] Date of Birth: [birth year]

## 2021-04-26 ENCOUNTER — Ambulatory Visit (HOSPITAL_BASED_OUTPATIENT_CLINIC_OR_DEPARTMENT_OTHER): Payer: Medicare Other | Admitting: Physical Therapy

## 2021-04-26 ENCOUNTER — Other Ambulatory Visit: Payer: Self-pay

## 2021-04-26 DIAGNOSIS — M25512 Pain in left shoulder: Secondary | ICD-10-CM

## 2021-04-26 DIAGNOSIS — G8929 Other chronic pain: Secondary | ICD-10-CM

## 2021-04-26 DIAGNOSIS — R262 Difficulty in walking, not elsewhere classified: Secondary | ICD-10-CM

## 2021-04-26 DIAGNOSIS — M545 Low back pain, unspecified: Secondary | ICD-10-CM

## 2021-04-26 NOTE — Therapy (Signed)
[place], [place], [address] Phone: [phone]   Fax:  [phone]  Physical Therapy Treatment  Patient Details  Name: Dawn Lucas MRN: [id number] Date of Birth: [birth year] Referring Provider (PT): Alysia Penna, MD   Encounter Date: 04/26/2021   PT End of Session - 04/26/21 1132     Visit Number 13    Number of Visits 20    Date for PT Re-Evaluation 05/18/21    Authorization Type MCR/MCD    PT Start Time 1118    PT Stop Time 1203    PT Time Calculation (min) 45 min    Equipment Utilized During Treatment Other (comment)    Activity Tolerance Patient tolerated treatment well    Behavior During Therapy [facility] for tasks assessed/performed             Past Medical History:  Diagnosis Date   Anemia    Arthritis    hands, knees   Asthma    Back pain    Fibromyalgia    Headache    Hyperlipidemia    Hypertension 11/04/2011   Poor dental hygiene    missing and chipped teeth   Sleep apnea    Uses CPAP nightly   SVD (spontaneous vaginal delivery)    x 1   UTI (lower urinary tract infection)     Past Surgical History:  Procedure Laterality Date   CHOLECYSTECTOMY     COLON SURGERY     DILATION AND CURETTAGE OF UTERUS     MAB x 4   HYSTEROSCOPY WITH D & C N/A 02/23/2018   Procedure: DILATATION AND CURETTAGE /HYSTEROSCOPY;  Surgeon: Mora Bellman, MD;  Location: [facility];  Service: Gynecology;  Laterality: N/A;   OVARIAN CYST REMOVAL      There were no vitals filed for this visit.   Subjective Assessment - 04/26/21 1215     Subjective "I can tell it is going to rain.  I am stiff and tired all over"                                         PT Short Term Goals - 04/05/21 1248       PT SHORT TERM GOAL #1   Status Achieved               PT Long Term Goals - 04/20/21 2117       PT LONG TERM GOAL #1   Title Gross extremity strength 5/5     Status On-going      PT LONG TERM GOAL #2   Title 5TSTS to 12s    Baseline 13s today    Status On-going      PT LONG TERM GOAL #3   Title 6MWT to improve by [place] x2    Baseline 800 ft at eval, 1095 today    Status On-going      PT LONG TERM GOAL #4   Title pt will be independent with long term HEP for strength, flexibility and mobility    Baseline requires furher progression    Status On-going             Warm up 2 x 4 widths of fast paced each of forward stepping then backward, sideways and Karioki.  Pt given HHA then sba to encourage speed for aerobic capacity challenge     Standing Squoodle kick  downs 3 x 10 reps each LE; hip flex x 2, hip flex with ext rotation x 10.  Requires support of wall with progression to squoodle   Seated Knee flex and extension with ankle cuffs  2 x 15 reps 3 x 20 sec flutter kicking (seated on edge of bench, arms suspended at 90 degrees cuing for abdomin and glute tightness     Standing/aerobic capacity Water walking/running forward x 6 widths, backwards x 4 and sidestepping x 4.  Pt challenged by therapist walking along with encouraging increased speed and adding manual perturbations Add/abd 2 x 10 Hip/knee flex/ext facing wall 2 x 15 reps Hip flex and quad stretch same position as above 3 x 20 sec hold. Step ups on bottom step;leading with each LE x 10 reps initiated holding to handrails then unsupported as tolerated. Cues for tight core and balance awareness.     Pt requires buoyancy for support and to offload joints with strengthening exercises. Viscosity of the water is needed for resistance of strengthening; water current perturbations provides challenge to standing balance unsupported, requiring increased core activation       Plan - 04/26/21 1204     Clinical Impression Statement Pt presents with slight left hip discomfort and genral fatigue. PT progresses pt to stair climing and bicycling sitting on step for strengthening as well  as aerobic capacity benefit.  She demonstrates indep with completion of hip flex and abdominal stretching to relieve tightness experienced with added strengthening challneges. Flexabiliy improved with stretching, tolerating end range in hip flex and extension without discomfort.    Personal Factors and Comorbidities Comorbidity 2;Time since onset of injury/illness/exacerbation;Transportation;Fitness    Examination-Activity Limitations Bathing;Hygiene/Grooming;Squat;Bend;Stand;Locomotion Level;Caring for Others;Carry;Sit;Sleep    Stability/Clinical Decision Making Evolving/Moderate complexity    Clinical Decision Making Moderate    Rehab Potential Good    PT Frequency 2x / week    PT Treatment/Interventions ADLs/Self Care Home Management;Aquatic Therapy;Cryotherapy;Ultrasound;Moist Heat;Iontophoresis 4mg /ml Dexamethasone;Electrical Stimulation;Gait training;Functional mobility training;Stair training;Neuromuscular re-education;Therapeutic exercise;Therapeutic activities;Patient/family education;Manual techniques;Passive range of motion;Taping    PT Next Visit Plan laminate HEP prior to DC or when PT gets [place]             Patient will benefit from skilled therapeutic intervention in order to improve the following deficits and impairments:  Difficulty walking, Increased muscle spasms, Improper body mechanics, Decreased activity tolerance, Decreased strength, Impaired flexibility, Pain, Postural dysfunction  Visit Diagnosis: Chronic low back pain without sciatica, unspecified back pain laterality  Difficulty in walking, not elsewhere classified  Chronic left shoulder pain     Problem List Patient Active Problem List   Diagnosis Date Noted   Thickened endometrium    Prediabetes 04/28/2015   Pes planus of both feet 03/28/2015   Seborrheic keratoses 08/22/2014   Headache(784.0) 05/17/2014   Lower extremity edema 09/22/2013   Vitamin D  deficiency 09/22/2013   Tendinitis of left rotator cuff 06/17/2013   Right ankle pain 06/17/2013   Colon cancer screening 06/17/2013   Knee pain, bilateral 03/20/2012   Hypertension 11/04/2011   Generalized pain 10/15/2011   Back pain 02/08/2011   Obese 02/08/2011   Tobacco abuse 02/08/2011   Sleep disorder 02/08/2011    Vedia Pereyra 04/26/2021, 12:24 PM  [facility] [address] [place], [place], [address] Phone: [phone]   Fax:  [phone]  Name: Sammy Douthitt MRN: [id number] Date of Birth: May 05, 1956

## 2021-05-01 ENCOUNTER — Other Ambulatory Visit: Payer: Self-pay

## 2021-05-01 ENCOUNTER — Ambulatory Visit (HOSPITAL_BASED_OUTPATIENT_CLINIC_OR_DEPARTMENT_OTHER): Payer: Medicare Other | Admitting: Physical Therapy

## 2021-05-01 ENCOUNTER — Encounter (HOSPITAL_BASED_OUTPATIENT_CLINIC_OR_DEPARTMENT_OTHER): Payer: Self-pay | Admitting: Physical Therapy

## 2021-05-01 DIAGNOSIS — M545 Low back pain, unspecified: Secondary | ICD-10-CM

## 2021-05-01 DIAGNOSIS — G8929 Other chronic pain: Secondary | ICD-10-CM

## 2021-05-01 DIAGNOSIS — M25512 Pain in left shoulder: Secondary | ICD-10-CM

## 2021-05-01 DIAGNOSIS — R262 Difficulty in walking, not elsewhere classified: Secondary | ICD-10-CM

## 2021-05-01 NOTE — Therapy (Signed)
[facility] [address] [place], [place], [address] Phone: [phone]   Fax:  [phone]  Physical Therapy Treatment  Patient Details  Name: Dawn Lucas MRN: [id number] Date of Birth: Mar 07, 1956 Referring Provider (PT): Alysia Penna, MD   Encounter Date: 05/01/2021   PT End of Session - 05/01/21 1131     Visit Number 14    Number of Visits 20    Date for PT Re-Evaluation 05/18/21    Authorization Type MCR/MCD    PT Start Time 1116    PT Stop Time 1200    PT Time Calculation (min) 44 min    Equipment Utilized During Treatment Other (comment)    Activity Tolerance Patient tolerated treatment well    Behavior During Therapy [facility] for tasks assessed/performed             Past Medical History:  Diagnosis Date   Anemia    Arthritis    hands, knees   Asthma    Back pain    Fibromyalgia    Headache    Hyperlipidemia    Hypertension 11/04/2011   Poor dental hygiene    missing and chipped teeth   Sleep apnea    Uses CPAP nightly   SVD (spontaneous vaginal delivery)    x 1   UTI (lower urinary tract infection)     Past Surgical History:  Procedure Laterality Date   CHOLECYSTECTOMY     COLON SURGERY     DILATION AND CURETTAGE OF UTERUS     MAB x 4   HYSTEROSCOPY WITH D & C N/A 02/23/2018   Procedure: DILATATION AND CURETTAGE /HYSTEROSCOPY;  Surgeon: Mora Bellman, MD;  Location: [facility];  Service: Gynecology;  Laterality: N/A;   OVARIAN CYST REMOVAL      There were no vitals filed for this visit.   Subjective Assessment - 05/01/21 1124     Subjective My left knee has started to hurt a little moe recently maybe because of the bad weather.  Ankles a little swollen as well.    Currently in Pain? Yes    Pain Score 5     Pain Location Knee    Pain Orientation Left    Pain Descriptors / Indicators Aching    Pain Type Chronic pain    Pain Onset In the past 7 days    Pain Frequency Intermittent     Aggravating Factors  poor weather                                         PT Short Term Goals - 04/05/21 1248       PT SHORT TERM GOAL #1   Status Achieved               PT Long Term Goals - 04/20/21 2117       PT LONG TERM GOAL #1   Title Gross extremity strength 5/5    Status On-going      PT LONG TERM GOAL #2   Title 5TSTS to 12s    Baseline 13s today    Status On-going      PT LONG TERM GOAL #3   Title 6MWT to improve by MDC x2    Baseline 800 ft at eval, 1095 today    Status On-going      PT LONG TERM GOAL #4   Title pt will  be independent with long term HEP for strength, flexibility and mobility    Baseline requires furher progression    Status On-going             Warm up 2 x 4 widths of fast paced each of forward stepping then backward, sideways and [place].  Pt given HHA then sba to encourage speed for aerobic capacity challenge    Seated Stretching: hamstrings, gastroc and hip add 5 x 20 seconds Knee flex and extension with ankle cuffs  2 x 15 reps 3 x 20 rep flutter kicking seated on 3rd step   Standing Add/abd 2 x 10 Marching x 10; knee flex 10 Hip ext 2 x 10 Hip flex stretch 3 x 20 sec; hip ext 2 x 10 rep Hip circles 2 x 10 Single foam hand buoy: horizontal abd/add unresisted 2 x 10 then with hand buoys submerged 2 x 10 reps, shoulder flex 2 x 10  Vertical suspension  Yellow noodle pt challenged to gain balance without ue support, feet elevated.  Was able to attain position x 2 for ~ 10 seconds after 5 min of trials.   Pt requires buoyancy for support and to offload joints with strengthening exercises. Viscosity of the water is needed for resistance of strengthening; water current perturbations provides challenge to standing balance unsupported, requiring increased core activation       Plan - 05/01/21 1209     Clinical Impression Statement Increased left knee pain today.  She continues to report no  :BP or hup discomfort.  Shoulder tight.  Focused on ROM and gentle stretching. Core strength improving as evidenced by ability to gain balance sitting on yellow noodle x 2 for up to 10 seconds. Initiated discussion on potential  DC in next month and carrying on with HEP at [facility]    Personal Factors and Comorbidities Comorbidity 2;Time since onset of injury/illness/exacerbation;Transportation;Fitness    PT Treatment/Interventions ADLs/Self Care Home Management;Aquatic Therapy;Cryotherapy;Ultrasound;Moist Heat;Iontophoresis '4mg'$ /ml Dexamethasone;Electrical Stimulation;Gait training;Functional mobility training;Stair training;Neuromuscular re-education;Therapeutic exercise;Therapeutic activities;Patient/family education;Manual techniques;Passive range of motion;Taping             Patient will benefit from skilled therapeutic intervention in order to improve the following deficits and impairments:  Difficulty walking, Increased muscle spasms, Improper body mechanics, Decreased activity tolerance, Decreased strength, Impaired flexibility, Pain, Postural dysfunction  Visit Diagnosis: Chronic low back pain without sciatica, unspecified back pain laterality  Difficulty in walking, not elsewhere classified  Chronic left shoulder pain     Problem List Patient Active Problem List   Diagnosis Date Noted   Thickened endometrium    Prediabetes 04/28/2015   Pes planus of both feet 03/28/2015   Seborrheic keratoses 08/22/2014   Headache(784.0) 05/17/2014   Lower extremity edema 09/22/2013   Vitamin D deficiency 09/22/2013   Tendinitis of left rotator cuff 06/17/2013   Right ankle pain 06/17/2013   Colon cancer screening 06/17/2013   Knee pain, bilateral 03/20/2012   Hypertension 11/04/2011   Generalized pain 10/15/2011   Back pain 02/08/2011   Obese 02/08/2011   Tobacco abuse 02/08/2011   Sleep disorder 02/08/2011    Vedia Pereyra 05/01/2021, 12:23 PM  [facility] [address] [place], [place], [address] Phone: [phone]   Fax:  [phone]  Name: Dawn Lucas MRN: [id number] Date of Birth: 19-Sep-1956

## 2021-05-03 ENCOUNTER — Encounter (HOSPITAL_BASED_OUTPATIENT_CLINIC_OR_DEPARTMENT_OTHER): Payer: Self-pay | Admitting: Physical Therapy

## 2021-05-03 ENCOUNTER — Other Ambulatory Visit: Payer: Self-pay

## 2021-05-03 ENCOUNTER — Telehealth: Payer: Self-pay

## 2021-05-03 ENCOUNTER — Ambulatory Visit (HOSPITAL_BASED_OUTPATIENT_CLINIC_OR_DEPARTMENT_OTHER): Payer: Medicare Other | Admitting: Physical Therapy

## 2021-05-03 DIAGNOSIS — G8929 Other chronic pain: Secondary | ICD-10-CM

## 2021-05-03 DIAGNOSIS — R262 Difficulty in walking, not elsewhere classified: Secondary | ICD-10-CM

## 2021-05-03 DIAGNOSIS — M545 Low back pain, unspecified: Secondary | ICD-10-CM

## 2021-05-03 DIAGNOSIS — M25512 Pain in left shoulder: Secondary | ICD-10-CM

## 2021-05-03 DIAGNOSIS — Z006 Encounter for examination for normal comparison and control in clinical research program: Secondary | ICD-10-CM

## 2021-05-03 NOTE — Telephone Encounter (Signed)
Called patient for 90 day Identify phone call pt stated she is not having anymore cardiac symptoms but did follow up with PCP, I reminded the patient that we would be calling her back around April for a year follow up phone call.

## 2021-05-03 NOTE — Therapy (Signed)
[facility] [address] [place], [place], [address] Phone: [phone]   Fax:  [phone]  Physical Therapy Treatment  Patient Details  Name: Dawn Lucas MRN: [id number] Date of Birth: [birth year] Referring Provider (PT): Alysia Penna, MD   Encounter Date: 05/03/2021   PT End of Session - 05/03/21 1213     Visit Number 15    Number of Visits 20    Date for PT Re-Evaluation 05/18/21    Authorization Type MCR/MCD    Progress Note Due on Visit 20    PT Start Time 1100    PT Stop Time 1145    PT Time Calculation (min) 45 min    Equipment Utilized During Treatment Other (comment)   aquatic equipment   Activity Tolerance Patient tolerated treatment well    Behavior During Therapy [facility] for tasks assessed/performed             Past Medical History:  Diagnosis Date   Anemia    Arthritis    hands, knees   Asthma    Back pain    Fibromyalgia    Headache    Hyperlipidemia    Hypertension 11/04/2011   Poor dental hygiene    missing and chipped teeth   Sleep apnea    Uses CPAP nightly   SVD (spontaneous vaginal delivery)    x 1   UTI (lower urinary tract infection)     Past Surgical History:  Procedure Laterality Date   CHOLECYSTECTOMY     COLON SURGERY     DILATION AND CURETTAGE OF UTERUS     MAB x 4   HYSTEROSCOPY WITH D & C N/A 02/23/2018   Procedure: DILATATION AND CURETTAGE /HYSTEROSCOPY;  Surgeon: Mora Bellman, MD;  Location: [facility];  Service: Gynecology;  Laterality: N/A;   OVARIAN CYST REMOVAL      There were no vitals filed for this visit.   Subjective Assessment - 05/03/21 1208     Subjective Patient felt her left knee hurt more than normal due to weather but was tolerable . Patient stated that she is standing more at home to take breaks from prolonged sitting.    How long can you walk comfortably? I used to stop 10-20 times when walking, now I stop maybe 2 times    Patient Stated Goals need  to walk 1 long block to the bus. Only stopping twice before was stopping multiple times    Currently in Pain? Yes    Pain Score 5     Pain Location Knee    Pain Orientation Left    Pain Descriptors / Indicators Aching    Pain Type Chronic pain    Pain Onset In the past 7 days    Pain Frequency Intermittent    Aggravating Factors  poor weather    Pain Relieving Factors pool    Multiple Pain Sites No             Pt seen for aquatic therapy today.  Treatment took place in water 3.25-4 ft in depth at the [facility] pool. Temp of water was 91.  Pt entered/exited the pool via stairs (step through pattern) independently with bilat rail.  Introduction to water. Had patient stand at different levels so she could feel the bouncy   Warm up: heel/toe walking x4 laps across pool  side stepping x4 laps, backward walking x4 across pool, [place] walk x2 across pool.  Exercises; Slow march x20; hip extension x20; hip abduction  x20; Sit to stand x20;    Seated: board trunk flexion x10 lateral board rotation x10 each way seated   Steps step up x20 each leg; lateral step up x20 each leg.  Tandem Stance eyes open 2x30 secs.   Narrow base eyes closed 2x30 sec.  Marching with 1 sec hold on each leg 2x10 Hamstring stretch 3x20 sec hold   Pt requires buoyancy for support and to offload joints with strengthening exercises. Viscosity of the water is needed for resistance of strengthening; water current perturbations provides challenge to standing balance unsupported, requiring increased core activation.         PT Education - 05/03/21 1212     Education Details Discussed aquatic HEP. Patient has good understanding of pool rehab.    Person(s) Educated Patient    Methods Explanation    Comprehension Verbalized understanding              PT Short Term Goals - 04/05/21 1248       PT SHORT TERM GOAL #1   Status Achieved               PT Long Term Goals - 04/20/21 2117        PT LONG TERM GOAL #1   Title Gross extremity strength 5/5    Status On-going      PT LONG TERM GOAL #2   Title 5TSTS to 12s    Baseline 13s today    Status On-going      PT LONG TERM GOAL #3   Title 6MWT to improve by [place] x2    Baseline 800 ft at eval, 1095 today    Status On-going      PT LONG TERM GOAL #4   Title pt will be independent with long term HEP for strength, flexibility and mobility    Baseline requires furher progression    Status On-going                   Plan - 05/03/21 1214     Clinical Impression Statement Patient had increased knee pain from weather but was able to tolerated aquatic treatment today. Patient showed understanding of aquatic therapy HEP and states that she will [facility] soon to see if they have a pool at a location near her. Patient showed good strength and is able to complete warm at increase speed with no difficulty. Therapy should continue to progress as tolerated.    Personal Factors and Comorbidities Comorbidity 2;Time since onset of injury/illness/exacerbation;Transportation;Fitness    Comorbidities anxiety, HTN    Examination-Activity Limitations Bathing;Hygiene/Grooming;Squat;Bend;Stand;Locomotion Level;Caring for Others;Carry;Sit;Sleep    Examination-Participation Restrictions Laundry;Cleaning;Meal Prep;Community Activity    Stability/Clinical Decision Making Evolving/Moderate complexity    Clinical Decision Making Moderate    Rehab Potential Good    PT Frequency 2x / week    PT Duration Other (comment)    PT Treatment/Interventions ADLs/Self Care Home Management;Aquatic Therapy;Cryotherapy;Ultrasound;Moist Heat;Iontophoresis '4mg'$ /ml Dexamethasone;Electrical Stimulation;Gait training;Functional mobility training;Stair training;Neuromuscular re-education;Therapeutic exercise;Therapeutic activities;Patient/family education;Manual techniques;Passive range of motion;Taping    PT Next Visit Plan laminate HEP prior to DC or  when PT gets [place] and Agree with Plan of Care Patient             Patient will benefit from skilled therapeutic intervention in order to improve the following deficits and impairments:  Difficulty walking, Increased muscle spasms, Improper body mechanics, Decreased activity tolerance, Decreased strength, Impaired  flexibility, Pain, Postural dysfunction  Visit Diagnosis: Chronic low back pain without sciatica, unspecified back pain laterality  Difficulty in walking, not elsewhere classified  Chronic left shoulder pain     Problem List Patient Active Problem List   Diagnosis Date Noted   Thickened endometrium    Prediabetes 04/28/2015   Pes planus of both feet 03/28/2015   Seborrheic keratoses 08/22/2014   Headache(784.0) 05/17/2014   Lower extremity edema 09/22/2013   Vitamin D deficiency 09/22/2013   Tendinitis of left rotator cuff 06/17/2013   Right ankle pain 06/17/2013   Colon cancer screening 06/17/2013   Knee pain, bilateral 03/20/2012   Hypertension 11/04/2011   Generalized pain 10/15/2011   Back pain 02/08/2011   Obese 02/08/2011   Tobacco abuse 02/08/2011   Sleep disorder 02/08/2011   Carolyne Littles PT DPT  04/27/2021  Billey Co SPT 05/03/2021, 12:19 PM  During this treatment session, the therapist was present, participating in and directing the treatment.   [facility] [address] [place], [place], [address] Phone: [phone]   Fax:  [phone]  Name: Dawn Lucas MRN: [id number] Date of Birth: 1956/08/16

## 2021-05-08 ENCOUNTER — Encounter (HOSPITAL_BASED_OUTPATIENT_CLINIC_OR_DEPARTMENT_OTHER): Payer: Self-pay | Admitting: Physical Therapy

## 2021-05-08 ENCOUNTER — Ambulatory Visit (HOSPITAL_BASED_OUTPATIENT_CLINIC_OR_DEPARTMENT_OTHER): Payer: Medicare Other | Attending: Physical Medicine & Rehabilitation | Admitting: Physical Therapy

## 2021-05-08 ENCOUNTER — Other Ambulatory Visit: Payer: Self-pay

## 2021-05-08 DIAGNOSIS — G8929 Other chronic pain: Secondary | ICD-10-CM | POA: Diagnosis present

## 2021-05-08 DIAGNOSIS — M545 Low back pain, unspecified: Secondary | ICD-10-CM | POA: Diagnosis not present

## 2021-05-08 DIAGNOSIS — M25512 Pain in left shoulder: Secondary | ICD-10-CM | POA: Insufficient documentation

## 2021-05-08 DIAGNOSIS — R262 Difficulty in walking, not elsewhere classified: Secondary | ICD-10-CM | POA: Diagnosis present

## 2021-05-08 NOTE — Therapy (Signed)
[facility] [address] [place], [place], [address] Phone: [phone]   Fax:  [phone]  Physical Therapy Treatment  Patient Details  Name: Dawn Lucas MRN: [id number] Date of Birth: Sep 16, 1956 Referring Provider (PT): Alysia Penna, MD   Encounter Date: 05/08/2021   PT End of Session - 05/08/21 1200     Visit Number 16    Number of Visits 20    Date for PT Re-Evaluation 05/18/21    Authorization Type MCR/MCD KX at 15    Progress Note Due on Visit 20    PT Start Time 1150    PT Stop Time 1230    PT Time Calculation (min) 40 min    Activity Tolerance Patient tolerated treatment well    Behavior During Therapy [facility] for tasks assessed/performed             Past Medical History:  Diagnosis Date   Anemia    Arthritis    hands, knees   Asthma    Back pain    Fibromyalgia    Headache    Hyperlipidemia    Hypertension 11/04/2011   Poor dental hygiene    missing and chipped teeth   Sleep apnea    Uses CPAP nightly   SVD (spontaneous vaginal delivery)    x 1   UTI (lower urinary tract infection)     Past Surgical History:  Procedure Laterality Date   CHOLECYSTECTOMY     COLON SURGERY     DILATION AND CURETTAGE OF UTERUS     MAB x 4   HYSTEROSCOPY WITH D & C N/A 02/23/2018   Procedure: DILATATION AND CURETTAGE /HYSTEROSCOPY;  Surgeon: Mora Bellman, MD;  Location: [facility];  Service: Gynecology;  Laterality: N/A;   OVARIAN CYST REMOVAL      There were no vitals filed for this visit.   Subjective Assessment - 05/08/21 1153     Subjective weather is making my knees, hips and hands hurt. Rt shoulder is bothering me more today.    Patient Stated Goals need to walk 1 long block to the bus. Only stopping twice before was stopping multiple times    Currently in Pain? Yes    Pain Location --   gross pain                  AquaticREHABdocumentation: Water will allow for reduced gait deviation due  to reduced joint loading through buoyancy to help patient improve posture without excess stress and pain. , Water will aid with movement using the current and laminar flow while the buoyancy reduces weight bearing, Pt requires the buoyancy of water for active assisted exercises with buoyancy supported for strengthening & ROM exercises, and Hydrostatic pressure also supports joints by unweighting joint load by at least 50 % in 3-4 feet depth water. 80% in chest to neck deep water. Walking with heel-toe gait Side stepping Punches with yellow water dumbbells with return to upright posture bw each Triceps press down yellow dumbbells Bicycles on bench Hip abd/add with legs up- seated on bench Long arc UE, under water: flx/ext, horiz abd/add, scissors Hip extension with knee flexed                      PT Short Term Goals - 04/05/21 1248       PT SHORT TERM GOAL #1   Status Achieved               PT Long  Term Goals - 04/20/21 2117       PT LONG TERM GOAL #1   Title Gross extremity strength 5/5    Status On-going      PT LONG TERM GOAL #2   Title 5TSTS to 12s    Baseline 13s today    Status On-going      PT LONG TERM GOAL #3   Title 6MWT to improve by [place] x2    Baseline 800 ft at eval, 1095 today    Status On-going      PT LONG TERM GOAL #4   Title pt will be independent with long term HEP for strength, flexibility and mobility    Baseline requires furher progression    Status On-going                   Plan - 05/08/21 1237     Clinical Impression Statement Entered pool using single hand rail, depth 3'6" and temp 88-90 deg. Pt had great tolerance to exercises today with minimal complaints of discomfort in Rt shoulder during activities. Was able toa ppropriately feel muscle groups activating. Cues required to avoid shrugging. [facility] to see if they have a pool.    PT Treatment/Interventions ADLs/Self Care Home  Management;Aquatic Therapy;Cryotherapy;Ultrasound;Moist Heat;Iontophoresis '4mg'$ /ml Dexamethasone;Electrical Stimulation;Gait training;Functional mobility training;Stair training;Neuromuscular re-education;Therapeutic exercise;Therapeutic activities;Patient/family education;Manual techniques;Passive range of motion;Taping    PT Next Visit Plan laminate HEP prior to DC or when PT gets [place] and Agree with Plan of Care Patient             Patient will benefit from skilled therapeutic intervention in order to improve the following deficits and impairments:  Difficulty walking, Increased muscle spasms, Improper body mechanics, Decreased activity tolerance, Decreased strength, Impaired flexibility, Pain, Postural dysfunction  Visit Diagnosis: Chronic low back pain without sciatica, unspecified back pain laterality  Difficulty in walking, not elsewhere classified  Chronic left shoulder pain     Problem List Patient Active Problem List   Diagnosis Date Noted   Thickened endometrium    Prediabetes 04/28/2015   Pes planus of both feet 03/28/2015   Seborrheic keratoses 08/22/2014   Headache(784.0) 05/17/2014   Lower extremity edema 09/22/2013   Vitamin D deficiency 09/22/2013   Tendinitis of left rotator cuff 06/17/2013   Right ankle pain 06/17/2013   Colon cancer screening 06/17/2013   Knee pain, bilateral 03/20/2012   Hypertension 11/04/2011   Generalized pain 10/15/2011   Back pain 02/08/2011   Obese 02/08/2011   Tobacco abuse 02/08/2011   Sleep disorder 02/08/2011   Reno Clasby C. Alver Leete PT, DPT 05/08/21 12:41 PM  [facility] [address] [place], [place], [address] Phone: [phone]   Fax:  [phone]  Name: Dawn Lucas MRN: [id number] Date of Birth: 01/21/1956

## 2021-05-10 ENCOUNTER — Ambulatory Visit (HOSPITAL_BASED_OUTPATIENT_CLINIC_OR_DEPARTMENT_OTHER): Payer: Medicare Other | Admitting: Physical Therapy

## 2021-05-10 ENCOUNTER — Other Ambulatory Visit: Payer: Self-pay

## 2021-05-10 DIAGNOSIS — G8929 Other chronic pain: Secondary | ICD-10-CM

## 2021-05-10 DIAGNOSIS — M545 Low back pain, unspecified: Secondary | ICD-10-CM | POA: Diagnosis not present

## 2021-05-10 DIAGNOSIS — R262 Difficulty in walking, not elsewhere classified: Secondary | ICD-10-CM

## 2021-05-11 ENCOUNTER — Encounter (HOSPITAL_BASED_OUTPATIENT_CLINIC_OR_DEPARTMENT_OTHER): Payer: Self-pay | Admitting: Physical Therapy

## 2021-05-11 NOTE — Therapy (Signed)
[facility] [address] [place], [place], [address] Phone: [phone]   Fax:  [phone]  Physical Therapy Treatment  Patient Details  Name: Dawn Lucas MRN: [id number] Date of Birth: 20-Aug-1956 Referring Provider (PT): Alysia Penna, MD   Encounter Date: 05/10/2021   PT End of Session - 05/11/21 0831     Visit Number 17    Number of Visits 20    Date for PT Re-Evaluation 05/18/21    Authorization Type MCR/MCD KX at 15    Progress Note Due on Visit 20    PT Start Time 1145    PT Stop Time 1227    PT Time Calculation (min) 42 min    Equipment Utilized During Treatment Other (comment)   water equipment   Activity Tolerance Patient tolerated treatment well    Behavior During Therapy [facility] for tasks assessed/performed             Past Medical History:  Diagnosis Date   Anemia    Arthritis    hands, knees   Asthma    Back pain    Fibromyalgia    Headache    Hyperlipidemia    Hypertension 11/04/2011   Poor dental hygiene    missing and chipped teeth   Sleep apnea    Uses CPAP nightly   SVD (spontaneous vaginal delivery)    x 1   UTI (lower urinary tract infection)     Past Surgical History:  Procedure Laterality Date   CHOLECYSTECTOMY     COLON SURGERY     DILATION AND CURETTAGE OF UTERUS     MAB x 4   HYSTEROSCOPY WITH D & C N/A 02/23/2018   Procedure: DILATATION AND CURETTAGE /HYSTEROSCOPY;  Surgeon: Mora Bellman, MD;  Location: [facility];  Service: Gynecology;  Laterality: N/A;   OVARIAN CYST REMOVAL      There were no vitals filed for this visit.   Subjective Assessment - 05/11/21 0828     Subjective Patient states She has been doig pretty good. She reports she was a little sore after her last visit but not too bad.    How long can you stand comfortably? have to sit to shower    How long can you walk comfortably? I used to stop 10-20 times when walking, now I stop maybe 2 times    Patient  Stated Goals need to walk 1 long block to the bus. Only stopping twice before was stopping multiple times    Currently in Pain? Yes    Pain Score 5     Pain Location Back    Pain Orientation Left    Pain Descriptors / Indicators Aching    Pain Type Chronic pain    Pain Onset In the past 7 days    Pain Frequency Intermittent    Aggravating Factors  poor weather    Pain Relieving Factors pool    Multiple Pain Sites No              Pt seen for aquatic therapy today.  Treatment took place in water 3.25-4 ft in depth at the [facility] pool. Temp of water was 91.  Pt entered/exited the pool via stairs (step through pattern) independently with bilat rail.      Warm up: heel/toe walking x4 laps across pool  side stepping x4 laps, backward walking x4 across pool, [place] walk x2 across pool.   Exercises; Slow march x20; hip extension x20; hip abduction x20; Sit  to stand x20;     Seated: board trunk flexion x10 lateral board rotation x10 each way seated   Steps step up x20 each leg; lateral step up x20 each leg.   Tandem Stance eyes open 2x30 secs.   Narrow base eyes closed 2x30 sec.  LAQ 2lbs x20 each leg march x20 each leg     Pt requires buoyancy for support and to offload joints with strengthening exercises. Viscosity of the water is needed for resistance of strengthening; water current perturbations provides challenge to standing balance unsupported, requiring increased core activation.                           PT Education - 05/11/21 0830     Education Details Reviewed aquatic HEP and exercise to help with symptom management.    Person(s) Educated Patient    Methods Explanation    Comprehension Verbalized understanding              PT Short Term Goals - 04/05/21 1248       PT SHORT TERM GOAL #1   Status Achieved               PT Long Term Goals - 04/20/21 2117       PT LONG TERM GOAL #1   Title Gross extremity  strength 5/5    Status On-going      PT LONG TERM GOAL #2   Title 5TSTS to 12s    Baseline 13s today    Status On-going      PT LONG TERM GOAL #3   Title 6MWT to improve by [place] x2    Baseline 800 ft at eval, 1095 today    Status On-going      PT LONG TERM GOAL #4   Title pt will be independent with long term HEP for strength, flexibility and mobility    Baseline requires furher progression    Status On-going                   Plan - 05/11/21 0831     Clinical Impression Statement Patient tolerated treatment well. Therapy focused on strengthening and balance. She had no lass of balance She required increased gaurding with eyes closed balance activity.    Personal Factors and Comorbidities Comorbidity 2;Time since onset of injury/illness/exacerbation;Transportation;Fitness    Comorbidities anxiety, HTN    Examination-Activity Limitations Bathing;Hygiene/Grooming;Squat;Bend;Stand;Locomotion Level;Caring for Others;Carry;Sit;Sleep    Examination-Participation Restrictions Laundry;Cleaning;Meal Prep;Community Activity    Stability/Clinical Decision Making Evolving/Moderate complexity    Clinical Decision Making Moderate    Rehab Potential Good    PT Frequency 2x / week    PT Duration Other (comment)    PT Treatment/Interventions ADLs/Self Care Home Management;Aquatic Therapy;Cryotherapy;Ultrasound;Moist Heat;Iontophoresis '4mg'$ /ml Dexamethasone;Electrical Stimulation;Gait training;Functional mobility training;Stair training;Neuromuscular re-education;Therapeutic exercise;Therapeutic activities;Patient/family education;Manual techniques;Passive range of motion;Taping    PT Next Visit Plan laminate HEP prior to DC or when PT gets [place] and Agree with Plan of Care Patient             Patient will benefit from skilled therapeutic intervention in order to improve the following deficits and impairments:  Difficulty  walking, Increased muscle spasms, Improper body mechanics, Decreased activity tolerance, Decreased strength, Impaired flexibility, Pain, Postural dysfunction  Visit Diagnosis: Chronic low back pain without sciatica, unspecified back pain laterality  Difficulty in walking, not elsewhere classified  Chronic  left shoulder pain     Problem List Patient Active Problem List   Diagnosis Date Noted   Thickened endometrium    Prediabetes 04/28/2015   Pes planus of both feet 03/28/2015   Seborrheic keratoses 08/22/2014   Headache(784.0) 05/17/2014   Lower extremity edema 09/22/2013   Vitamin D deficiency 09/22/2013   Tendinitis of left rotator cuff 06/17/2013   Right ankle pain 06/17/2013   Colon cancer screening 06/17/2013   Knee pain, bilateral 03/20/2012   Hypertension 11/04/2011   Generalized pain 10/15/2011   Back pain 02/08/2011   Obese 02/08/2011   Tobacco abuse 02/08/2011   Sleep disorder 02/08/2011    Carney Living PT DPT  05/11/2021, 11:14 AM  [facility] [address] [place], [place], [address] Phone: [phone]   Fax:  [phone]  Name: Dawn Lucas MRN: [id number] Date of Birth: [birth year]

## 2021-05-15 ENCOUNTER — Encounter (HOSPITAL_BASED_OUTPATIENT_CLINIC_OR_DEPARTMENT_OTHER): Payer: Self-pay | Admitting: Physical Therapy

## 2021-05-15 ENCOUNTER — Other Ambulatory Visit: Payer: Self-pay

## 2021-05-15 ENCOUNTER — Ambulatory Visit (HOSPITAL_BASED_OUTPATIENT_CLINIC_OR_DEPARTMENT_OTHER): Payer: Medicare Other | Admitting: Physical Therapy

## 2021-05-15 DIAGNOSIS — R262 Difficulty in walking, not elsewhere classified: Secondary | ICD-10-CM

## 2021-05-15 DIAGNOSIS — M545 Low back pain, unspecified: Secondary | ICD-10-CM | POA: Diagnosis not present

## 2021-05-15 DIAGNOSIS — G8929 Other chronic pain: Secondary | ICD-10-CM

## 2021-05-15 NOTE — Therapy (Signed)
[facility] [address] [place], [place], [address] Phone: [phone]   Fax:  [phone]  Physical Therapy Treatment  Patient Details  Name: Dawn Lucas MRN: [id number] Date of Birth: 07/03/1956 Referring Provider (PT): Alysia Penna, MD   Encounter Date: 05/15/2021   PT End of Session - 05/15/21 1139     Visit Number 18    Number of Visits 20    Date for PT Re-Evaluation 05/18/21    Authorization Type MCR/MCD KX at 15    Progress Note Due on Visit 22    PT Start Time 1116    PT Stop Time 1200    PT Time Calculation (min) 44 min    Equipment Utilized During Treatment Other (comment)    Activity Tolerance Patient tolerated treatment well             Past Medical History:  Diagnosis Date   Anemia    Arthritis    hands, knees   Asthma    Back pain    Fibromyalgia    Headache    Hyperlipidemia    Hypertension 11/04/2011   Poor dental hygiene    missing and chipped teeth   Sleep apnea    Uses CPAP nightly   SVD (spontaneous vaginal delivery)    x 1   UTI (lower urinary tract infection)     Past Surgical History:  Procedure Laterality Date   CHOLECYSTECTOMY     COLON SURGERY     DILATION AND CURETTAGE OF UTERUS     MAB x 4   HYSTEROSCOPY WITH D & C N/A 02/23/2018   Procedure: DILATATION AND CURETTAGE /HYSTEROSCOPY;  Surgeon: Mora Bellman, MD;  Location: [facility];  Service: Gynecology;  Laterality: N/A;   OVARIAN CYST REMOVAL      There were no vitals filed for this visit.   Subjective Assessment - 05/15/21 1214     Subjective I am hurting bad Left side due to the bad weather we have been having                                         PT Short Term Goals - 04/05/21 1248       PT SHORT TERM GOAL #1   Status Achieved               PT Long Term Goals - 04/20/21 2117       PT LONG TERM GOAL #1   Title Gross extremity strength 5/5    Status On-going       PT LONG TERM GOAL #2   Title 5TSTS to 12s    Baseline 13s today    Status On-going      PT LONG TERM GOAL #3   Title 6MWT to improve by [place] x2    Baseline 800 ft at eval, 1095 today    Status On-going      PT LONG TERM GOAL #4   Title pt will be independent with long term HEP for strength, flexibility and mobility    Baseline requires furher progression    Status On-going            Pt seen for aquatic therapy today.  Treatment took place in water 3.25-4 ft in depth at the [facility] pool. Temp of water was 94.  Pt entered/exited the pool via stairs step through pattern independently with  bilat rail.   Warm up: heel/toe walking x4 laps across pool  side stepping x4 laps, backward walking x4 across pool, [place] walk x2 across pool.   Standing hip extension  2x10; hip abduction 4 x 10   Seated: Stretching gastroc, hamstring and LB ( with tactile assist)  3 x 30 sec Sit to stand cueing for hip hinges 2 x 10  board trunk flexion x10 lateral board rotation x10 each way seated, knee flex/ext 3 x10    Steps step up x20 each leg bottom step.  Added balance challenge decreasing ue support to unilateral  Water walking submerged 75% forward and backward x 6 widths.  Vertically suspended  lb rotation using yellow noodle          Pt requires buoyancy for support and to offload joints with strengthening exercises. Viscosity of the water is needed for resistance of strengthening; water current perturbations provides challenge to standing balance unsupported, requiring increased core activation.       Plan - 05/15/21 1216     Clinical Impression Statement Pt unsteady with sit to stand activity today.  Losing balnce towards left.  Pt with increased discomfort reproted left sided.  No assistance needed to regain positioning.  Added deep water activities today to unload LB and lle. No shoulder discomfort.    Personal Factors and Comorbidities Comorbidity 2;Time  since onset of injury/illness/exacerbation;Transportation;Fitness    Stability/Clinical Decision Making Evolving/Moderate complexity    Clinical Decision Making Moderate    Rehab Potential Good    PT Frequency 2x / week    PT Duration Other (comment)    PT Treatment/Interventions ADLs/Self Care Home Management;Aquatic Therapy;Cryotherapy;Ultrasound;Moist Heat;Iontophoresis '4mg'$ /ml Dexamethasone;Electrical Stimulation;Gait training;Functional mobility training;Stair training;Neuromuscular re-education;Therapeutic exercise;Therapeutic activities;Patient/family education;Manual techniques;Passive range of motion;Taping    PT Next Visit Plan laminate HEP prior to DC or when PT gets [place]             Patient will benefit from skilled therapeutic intervention in order to improve the following deficits and impairments:  Difficulty walking, Increased muscle spasms, Improper body mechanics, Decreased activity tolerance, Decreased strength, Impaired flexibility, Pain, Postural dysfunction  Visit Diagnosis: Chronic low back pain without sciatica, unspecified back pain laterality  Difficulty in walking, not elsewhere classified  Chronic left shoulder pain     Problem List Patient Active Problem List   Diagnosis Date Noted   Thickened endometrium    Prediabetes 04/28/2015   Pes planus of both feet 03/28/2015   Seborrheic keratoses 08/22/2014   Headache(784.0) 05/17/2014   Lower extremity edema 09/22/2013   Vitamin D deficiency 09/22/2013   Tendinitis of left rotator cuff 06/17/2013   Right ankle pain 06/17/2013   Colon cancer screening 06/17/2013   Knee pain, bilateral 03/20/2012   Hypertension 11/04/2011   Generalized pain 10/15/2011   Back pain 02/08/2011   Obese 02/08/2011   Tobacco abuse 02/08/2011   Sleep disorder 02/08/2011    Vedia Pereyra  MPT 05/15/2021, 12:21 PM  [facility] [address] [place], [place], [address] Phone: [phone]   Fax:  [phone]  Name: Alix Seymore MRN: [id number] Date of Birth: 10-Sep-1956

## 2021-05-17 ENCOUNTER — Other Ambulatory Visit: Payer: Self-pay

## 2021-05-17 ENCOUNTER — Ambulatory Visit (HOSPITAL_BASED_OUTPATIENT_CLINIC_OR_DEPARTMENT_OTHER): Payer: Medicare Other | Admitting: Physical Therapy

## 2021-05-17 ENCOUNTER — Encounter (HOSPITAL_BASED_OUTPATIENT_CLINIC_OR_DEPARTMENT_OTHER): Payer: Self-pay | Admitting: Physical Therapy

## 2021-05-17 DIAGNOSIS — M545 Low back pain, unspecified: Secondary | ICD-10-CM | POA: Diagnosis not present

## 2021-05-17 DIAGNOSIS — M25512 Pain in left shoulder: Secondary | ICD-10-CM

## 2021-05-17 DIAGNOSIS — R262 Difficulty in walking, not elsewhere classified: Secondary | ICD-10-CM

## 2021-05-17 DIAGNOSIS — G8929 Other chronic pain: Secondary | ICD-10-CM

## 2021-05-17 NOTE — Therapy (Addendum)
[facility] [address] [place], [place], [address] Phone: [phone]   Fax:  [phone]  Physical Therapy Treatment/Discharge   Patient Details  Name: Dawn Lucas MRN: [id number] Date of Birth: 1956/03/19 Referring Provider (PT): Alysia Penna, MD   Encounter Date: 05/17/2021   PT End of Session - 05/17/21 0958     Visit Number 19    Number of Visits 20    Date for PT Re-Evaluation 05/18/21    PT Start Time 0951    PT Stop Time 1030    PT Time Calculation (min) 39 min    Equipment Utilized During Treatment Other (comment)    Activity Tolerance Patient tolerated treatment well    Behavior During Therapy [facility] for tasks assessed/performed             Past Medical History:  Diagnosis Date   Anemia    Arthritis    hands, knees   Asthma    Back pain    Fibromyalgia    Headache    Hyperlipidemia    Hypertension 11/04/2011   Poor dental hygiene    missing and chipped teeth   Sleep apnea    Uses CPAP nightly   SVD (spontaneous vaginal delivery)    x 1   UTI (lower urinary tract infection)     Past Surgical History:  Procedure Laterality Date   CHOLECYSTECTOMY     COLON SURGERY     DILATION AND CURETTAGE OF UTERUS     MAB x 4   HYSTEROSCOPY WITH D & C N/A 02/23/2018   Procedure: DILATATION AND CURETTAGE /HYSTEROSCOPY;  Surgeon: Mora Bellman, MD;  Location: [facility];  Service: Gynecology;  Laterality: N/A;   OVARIAN CYST REMOVAL      There were no vitals filed for this visit.   Subjective Assessment - 05/17/21 1000     Subjective "Feeling great"    Currently in Pain? No/denies                                         PT Short Term Goals - 04/05/21 1248       PT SHORT TERM GOAL #1   Status Achieved               PT Long Term Goals - 04/20/21 2117       PT LONG TERM GOAL #1   Title Gross extremity strength 5/5    Status On-going      PT LONG TERM  GOAL #2   Title 5TSTS to 12s    Baseline 13s today    Status On-going      PT LONG TERM GOAL #3   Title 6MWT to improve by [place] x2    Baseline 800 ft at eval, 1095 today    Status On-going      PT LONG TERM GOAL #4   Title pt will be independent with long term HEP for strength, flexibility and mobility    Baseline requires furher progression    Status On-going                   Plan - 05/17/21 1007     Clinical Impression Statement Pt excelling.  Continued with advancing balance challenges adding Ai Chi 13 modified using yellow noodle. Pt completes after multiple tries consecutively x  5 demonstrating improved core strength and balance.  Pt  has one more aquatic session.  She will be ready for DC.    Personal Factors and Comorbidities Comorbidity 2;Time since onset of injury/illness/exacerbation;Transportation;Fitness    Examination-Activity Limitations Bathing;Hygiene/Grooming;Squat;Bend;Stand;Locomotion Level;Caring for Others;Carry;Sit;Sleep    Stability/Clinical Decision Making Evolving/Moderate complexity    Rehab Potential Good    PT Frequency 2x / week    PT Duration Other (comment)    PT Treatment/Interventions ADLs/Self Care Home Management;Aquatic Therapy;Cryotherapy;Ultrasound;Moist Heat;Iontophoresis 15m/ml Dexamethasone;Electrical Stimulation;Gait training;Functional mobility training;Stair training;Neuromuscular re-education;Therapeutic exercise;Therapeutic activities;Patient/family education;Manual techniques;Passive range of motion;Taping    PT Home Exercise Plan [id number]           Pt seen for aquatic therapy today.  Treatment took place in water 3.25-4 ft [facility] Temp of water was 94.  Pt entered/exited the pool via stairs step through pattern independently with bilat rail.   Warm up: heel/toe walking x4 laps across pool  side stepping x4 laps, backward walking x4 across pool, [place] walk x2 across pool.   Standing   hip extension  2x15 ;hip abduction 2 x 15, flex 2 x 15 Balance: Holding to noodle  hip ext, flex and abd     Seated: Stretching gastroc, hamstring and LB ( with tactile assist)  3 x 30 sec  Standing With ankle buoy cuffs advanced to supported by yellow noodle Hip add/abd, flex, extension 2 x 15 reps. VC and TC for purpose and execution. Tight core and focus on body in space. Progressed to APenrose#13 modified using yellow noodle for UE support. Pt requiring multiple trials with each LE to complete/coordinate movement.     Steps step up x20 each leg bottom step.  Added balance challenge decreasing ue support to unilateral   Aerobic capacity Water walking/running 10 widths without rest period forward and backward.  VC for increasing step length and pace.  Pt demonstrating improvement in aerobic capacity with increased distance covered without rest period.    Patient will benefit from skilled therapeutic intervention in order to improve the following deficits and impairments:  Difficulty walking, Increased muscle spasms, Improper body mechanics, Decreased activity tolerance, Decreased strength, Impaired flexibility, Pain, Postural dysfunction  Visit Diagnosis: Chronic low back pain without sciatica, unspecified back pain laterality  Difficulty in walking, not elsewhere classified  Chronic left shoulder pain  PHYSICAL THERAPY DISCHARGE SUMMARY  Visits from Start of Care: 19  Current functional level related to goals / functional outcomes: Improved pain. FUll program   Remaining deficits: Pain at times   Education / Equipment: HEP    Patient agrees to discharge. Patient goals were met. Patient is being discharged due to meeting the stated rehab goals.    Problem List Patient Active Problem List   Diagnosis Date Noted   Thickened endometrium    Prediabetes 04/28/2015   Pes planus of both feet 03/28/2015   Seborrheic keratoses 08/22/2014   Headache(784.0) 05/17/2014    Lower extremity edema 09/22/2013   Vitamin D deficiency 09/22/2013   Tendinitis of left rotator cuff 06/17/2013   Right ankle pain 06/17/2013   Colon cancer screening 06/17/2013   Knee pain, bilateral 03/20/2012   Hypertension 11/04/2011   Generalized pain 10/15/2011   Back pain 02/08/2011   Obese 02/08/2011   Tobacco abuse 02/08/2011   Sleep disorder 02/08/2011    MVedia PereyraMPT 05/17/2021, 4:38 PM  [facility] [address] [address]Phone: [phone]  Fax:  [phone] Name: Dawn GilkisonMRN:  [id number] Date of Birth: 1956/01/05

## 2021-05-21 ENCOUNTER — Emergency Department (HOSPITAL_COMMUNITY)
Admission: EM | Admit: 2021-05-21 | Discharge: 2021-05-21 | Disposition: A | Discharge status: Home / Self Care | Payer: Medicare Other | Attending: Student | Admitting: Student

## 2021-05-21 ENCOUNTER — Other Ambulatory Visit: Payer: Self-pay

## 2021-05-21 ENCOUNTER — Encounter (HOSPITAL_COMMUNITY): Payer: Self-pay

## 2021-05-21 ENCOUNTER — Emergency Department (HOSPITAL_COMMUNITY): Payer: Medicare Other

## 2021-05-21 DIAGNOSIS — Z79899 Other long term (current) drug therapy: Secondary | ICD-10-CM | POA: Insufficient documentation

## 2021-05-21 DIAGNOSIS — F17210 Nicotine dependence, cigarettes, uncomplicated: Secondary | ICD-10-CM | POA: Diagnosis not present

## 2021-05-21 DIAGNOSIS — U071 COVID-19: Secondary | ICD-10-CM | POA: Diagnosis not present

## 2021-05-21 DIAGNOSIS — Z7951 Long term (current) use of inhaled steroids: Secondary | ICD-10-CM | POA: Diagnosis not present

## 2021-05-21 DIAGNOSIS — I10 Essential (primary) hypertension: Secondary | ICD-10-CM | POA: Diagnosis not present

## 2021-05-21 DIAGNOSIS — J029 Acute pharyngitis, unspecified: Secondary | ICD-10-CM | POA: Diagnosis present

## 2021-05-21 DIAGNOSIS — J45909 Unspecified asthma, uncomplicated: Secondary | ICD-10-CM | POA: Diagnosis not present

## 2021-05-21 MED ORDER — BENZONATATE 100 MG PO CAPS: 100.0000 mg | ORAL_CAPSULE | Freq: Three times a day (TID) | ORAL | 0 refills | Status: DC

## 2021-05-21 NOTE — ED Provider Notes (Signed)
[facility] DEPT Provider Note   CSN: [id number] Arrival date & time: 05/21/21  1202     History Chief Complaint  Patient presents with   Covid Positive    Dawn Lucas is a 65 y.o. female.  HPI  Patient presents with body aches, headache, congestion, sore throat x1 day.  Had a positive at-home COVID test yesterday.  She has multiple comorbidities including asthma, has not had to use her rescue inhaler in the last 48 hours.  She reports she called her primary care doctor to inform them of her COVID diagnosis, and that they were concerned about her breathing and advised to go to the ER for further evaluation.  She she feels short of breath, but has not felt bad enough to use her inhaler at home.  She has tried Tylenol with minimal relief.  Denies any aggravating factors.  Past Medical History:  Diagnosis Date   Anemia    Arthritis    hands, knees   Asthma    Back pain    Fibromyalgia    Headache    Hyperlipidemia    Hypertension 11/04/2011   Poor dental hygiene    missing and chipped teeth   Sleep apnea    Uses CPAP nightly   SVD (spontaneous vaginal delivery)    x 1   UTI (lower urinary tract infection)     Patient Active Problem List   Diagnosis Date Noted   Thickened endometrium    Prediabetes 04/28/2015   Pes planus of both feet 03/28/2015   Seborrheic keratoses 08/22/2014   Headache(784.0) 05/17/2014   Lower extremity edema 09/22/2013   Vitamin D deficiency 09/22/2013   Tendinitis of left rotator cuff 06/17/2013   Right ankle pain 06/17/2013   Colon cancer screening 06/17/2013   Knee pain, bilateral 03/20/2012   Hypertension 11/04/2011   Generalized pain 10/15/2011   Back pain 02/08/2011   Obese 02/08/2011   Tobacco abuse 02/08/2011   Sleep disorder 02/08/2011    Past Surgical History:  Procedure Laterality Date   CHOLECYSTECTOMY     COLON SURGERY     DILATION AND CURETTAGE OF UTERUS     MAB x 4   HYSTEROSCOPY WITH  D & C N/A 02/23/2018   Procedure: DILATATION AND CURETTAGE /HYSTEROSCOPY;  Surgeon: Mora Bellman, MD;  Location: [facility];  Service: Gynecology;  Laterality: N/A;   OVARIAN CYST REMOVAL       OB History     Gravida  5   Para  1   Term  1   Preterm      AB  4   Living  1      SAB  4   IAB      Ectopic      Multiple      Live Births  1           Family History  Problem Relation Age of Onset   Colon cancer Mother    Diabetes type II Maternal Grandmother    Diabetes type II Paternal Grandmother    Heart attack Sister    HIV Brother    Cirrhosis Brother    Diabetes Sister    HIV Brother     Social History   Tobacco Use   Smoking status: Every Day    Packs/day: 0.50    Years: 50.00    Pack years: 25.00    Types: Cigarettes   Smokeless tobacco: Former    Types: Snuff, Chew  Vaping  Use   Vaping Use: Never used  Substance Use Topics   Alcohol use: Yes    Comment: rarely    Drug use: No    Home Medications Prior to Admission medications   Medication Sig Start Date End Date Taking? Authorizing Provider  albuterol (VENTOLIN HFA) 108 (90 Base) MCG/ACT inhaler Inhale 2 puffs into the lungs every 6 (six) hours as needed for wheezing or shortness of breath. 11/03/19   Fulp, Cammie, MD  Ascorbic Acid (VITAMIN C) 125 MG CHEW Chew 1 tablet by mouth daily.    [provider]  atorvastatin (LIPITOR) 20 MG tablet Take 20 mg by mouth daily. 11/03/20   [provider]  calcium-vitamin D (OSCAL WITH D) 500-200 MG-UNIT tablet Take 1 tablet by mouth.    [provider]  Cholecalciferol (VITAMIN D3) 25 MCG (1000 UT) CAPS Take 1 capsule by mouth daily.    [provider]  colchicine 0.6 MG tablet Take 0.6 mg by mouth 2 (two) times daily. As needed for gout flare 08/15/20   [provider]  cromolyn (OPTICROM) 4 % ophthalmic solution Place 1 drop into both eyes daily.  Patient not taking: Reported on 03/08/2021 04/06/19   [provider]  diphenhydrAMINE (BENADRYL) 25 MG tablet Take 1 tablet (25 mg total) by mouth 3 (three) times daily. Take one tablet three times daily for two days 01/10/21   Carmin Muskrat, MD  DULoxetine (CYMBALTA) 30 MG capsule TAKE 1 CAPSULE(30 MG) BY MOUTH DAILY 03/06/21   Kirsteins, Luanna Salk, MD  famotidine (PEPCID) 20 MG tablet Take 1 tablet (20 mg total) by mouth 2 (two) times daily. Take one tablet twice daily for two days 01/10/21   Carmin Muskrat, MD  furosemide (LASIX) 40 MG tablet Take 40 mg by mouth daily. 10/27/20   [provider]  glipiZIDE (GLUCOTROL) 10 MG tablet Take 10 mg by mouth daily. 02/23/20   [provider]  losartan (COZAAR) 50 MG tablet Take 50 mg by mouth daily. 08/15/20   [provider]  meloxicam (MOBIC) 15 MG tablet Take 15 mg by mouth daily. 11/17/20   [provider]  montelukast (SINGULAIR) 10 MG tablet Take 10 mg by mouth daily. 11/09/20   [provider]  pantoprazole (PROTONIX) 20 MG tablet Take 1 tablet by mouth daily.    [provider]  predniSONE (DELTASONE) 20 MG tablet Take 2 tablets (40 mg total) by mouth daily with breakfast. For the next four days 01/10/21   Carmin Muskrat, MD  pregabalin (LYRICA) 200 MG capsule Take 1 capsule (200 mg total) by mouth 2 (two) times daily. 03/08/21   Kirsteins, Luanna Salk, MD  Probiotic Product (PROBIOTIC-10 PO) Take by mouth.    [provider]  SYMBICORT 80-4.5 MCG/ACT inhaler Inhale 2 puffs into the lungs 2 (two) times daily. 08/29/20   [provider]    Allergies    Contrast media [iodinated diagnostic agents], Quinolones, and Sulfa antibiotics  Review of Systems   Review of Systems  Constitutional:  Positive for fever.  HENT:  Positive for congestion and sore throat.   Respiratory:  Positive for cough and shortness of breath.   Cardiovascular:  Negative for chest pain.  Musculoskeletal:  Positive for myalgias.  Neurological:  Positive for  headaches.   Physical Exam Updated Vital Signs BP 133/86   Pulse 73   Temp 100.2 F (37.9 C) (Oral)   Resp 14   SpO2 100%   Physical Exam Vitals and  nursing note reviewed. Exam conducted with a chaperone present.  Constitutional:      General: She is not in acute distress.    Appearance: Normal appearance.  HENT:     Head: Normocephalic and atraumatic.     Nose: Congestion present.     Mouth/Throat:     Pharynx: Posterior oropharyngeal erythema present.  Eyes:     General: No scleral icterus.    Extraocular Movements: Extraocular movements intact.     Pupils: Pupils are equal, round, and reactive to light.  Cardiovascular:     Rate and Rhythm: Normal rate and regular rhythm.     Pulses: Normal pulses.     Heart sounds: Normal heart sounds.  Pulmonary:     Effort: Pulmonary effort is normal.     Breath sounds: Normal breath sounds.  Musculoskeletal:     Cervical back: Normal range of motion and neck supple.  Skin:    Coloration: Skin is not jaundiced.  Neurological:     Mental Status: She is alert. Mental status is at baseline.     Coordination: Coordination normal.    ED Results / Procedures / Treatments   Labs (all labs ordered are listed, but only abnormal results are displayed) Labs Reviewed  BASIC METABOLIC PANEL    EKG None  Radiology No results found.  Procedures Procedures   Medications Ordered in ED Medications - No data to display  ED Course  I have reviewed the triage vital signs and the nursing notes.  Pertinent labs & imaging results that were available during my care of the patient were reviewed by me and considered in my medical decision making (see chart for details).    MDM Rules/Calculators/A&P                           Patient vitals are stable, she is nontoxic-appearing.  She is 100% on room air without any tachypnea or accessory muscle use.  Does not appear to be in respiratory distress.  Will get a chest x-ray to evaluate  for possible pneumonia or underlying pulmonary pathology.  We will also check kidney function as patient is a candidate for Paxlovid.  Patient refused to have the BMP drawn, discussed the risk and benefits of being on Paxil Oved and patient does not wish to be on Paxloved.  We will proceed with chest x-ray.  No signs of pneumonia on the x-ray.  Patient is satting appropriately, not in any respiratory distress.  She is 100% on room air.  Do not think she needs further evaluation or admission at this point.  Will discharge with return precautions and symptomatic management.  Final Clinical Impression(s) / ED Diagnoses Final diagnoses:  None    Rx / DC Orders ED Discharge Orders     None        Sherrill Raring, PA-C 05/21/21 1414    Kommor, Carrabelle, MD 05/21/21 1623

## 2021-05-21 NOTE — ED Notes (Signed)
Patient states she can not take Tylenol or Motrin.

## 2021-05-21 NOTE — ED Notes (Signed)
Patient refuses labs.

## 2021-05-21 NOTE — ED Triage Notes (Signed)
Pt BIB EMS from home. Pt reports congestion, cough, body aches, headache, and fatigue. Pt reports taking at home COVID test which was positive. PCP sent her here of evaluation.

## 2021-05-21 NOTE — Discharge Instructions (Addendum)
Take Tylenol as needed for body aches and fever. He can also take Motrin if that works better. You will need to quarantine at home for 5 days.  After that you can return back to public if you are feeling better. If things change or worsen please come back to the ED as needed for further evaluation.

## 2021-05-22 ENCOUNTER — Ambulatory Visit (HOSPITAL_BASED_OUTPATIENT_CLINIC_OR_DEPARTMENT_OTHER): Payer: Medicare Other | Admitting: Physical Therapy

## 2021-05-24 ENCOUNTER — Ambulatory Visit (HOSPITAL_BASED_OUTPATIENT_CLINIC_OR_DEPARTMENT_OTHER): Payer: Medicare Other | Admitting: Physical Therapy

## 2021-05-28 ENCOUNTER — Ambulatory Visit
Admission: RE | Admit: 2021-05-28 | Discharge: 2021-05-28 | Disposition: A | Discharge status: Home / Self Care | Payer: Medicare Other | Source: Ambulatory Visit | Attending: Family | Admitting: Family

## 2021-05-28 ENCOUNTER — Other Ambulatory Visit: Payer: Self-pay

## 2021-05-28 DIAGNOSIS — Z1231 Encounter for screening mammogram for malignant neoplasm of breast: Secondary | ICD-10-CM

## 2021-06-07 ENCOUNTER — Other Ambulatory Visit: Payer: Self-pay | Admitting: Physical Medicine & Rehabilitation

## 2021-06-12 ENCOUNTER — Other Ambulatory Visit: Payer: Self-pay

## 2021-06-12 ENCOUNTER — Encounter: Payer: Self-pay | Admitting: Physical Medicine & Rehabilitation

## 2021-06-12 ENCOUNTER — Encounter: Payer: Medicare Other | Attending: Physical Medicine & Rehabilitation | Admitting: Physical Medicine & Rehabilitation

## 2021-06-12 VITALS — BP 126/83 | HR 65 | Temp 99.3°F | Ht 62.0 in | Wt 222.0 lb

## 2021-06-12 DIAGNOSIS — M797 Fibromyalgia: Secondary | ICD-10-CM | POA: Diagnosis present

## 2021-06-12 NOTE — Patient Instructions (Signed)
Try diclofenac gel to hands and knee 3 times a day  Stop Meloxicam  Remember to take Lyrica (pregabalin) twice a day  Continue duloxetine once a day

## 2021-06-12 NOTE — Progress Notes (Signed)
Subjective:    Patient ID: Dawn Lucas, female    DOB: [birth year], 65 y.o.   MRN: [id number]  HPI CC: Widespread body pain 65 year old female with history of fibromyalgia syndrome returns with complaints of fairly widespread body pain. Was attending aquatic PT prior to getting sick, missed last 2 visits  Had Covid 19 last month and needed ED visit last month 8/15 due to breathing issues but did not require hospitalization.  Did not know to take Lyrica BID , takes only once a day at various times  Continues to take duloxetine 30 mg/day Remains independent with all self care and mobility   Some exacerbation of pain and increased fatigue post COVID   Hx of OSA uses CPAP, history of asthma is using inhalers more since her COVID infection Meloxicam not helping pain , has not tried diclofenac gel Pain Inventory Average Pain 4 Pain Right Now 3 My pain is intermittent and aching  In the last 24 hours, has pain interfered with the following? General activity 3 Relation with others 0 Enjoyment of life 0 What TIME of day is your pain at its worst? morning , daytime, evening, and night Sleep (in general) Fair  Pain is worse with: walking, sitting, standing, and damp air Pain improves with: rest, therapy/exercise, and water therapy Relief from Meds: 3  Family History  Problem Relation Age of Onset   Colon cancer Mother    Diabetes type II Maternal Grandmother    Diabetes type II Paternal Grandmother    Heart attack Sister    HIV Brother    Cirrhosis Brother    Diabetes Sister    HIV Brother    Social History   Socioeconomic History   Marital status: Single    Spouse name: Not on file   Number of children: 1   Years of education: Not on file   Highest education level: 10th grade  Occupational History   Occupation: disabled  Tobacco Use   Smoking status: Every Day    Packs/day: 0.50    Years: 50.00    Pack years: 25.00    Types: Cigarettes   Smokeless tobacco:  Former    Types: Snuff, Chew  Vaping Use   Vaping Use: Never used  Substance and Sexual Activity   Alcohol use: Yes    Comment: rarely    Drug use: No   Sexual activity: Yes    Birth control/protection: Condom, Post-menopausal  Other Topics Concern   Not on file  Social History Narrative   Patient is right-handed. She lives alone.She drinks 3-4 cups of coffee a week, and green tea most days.   Social Determinants of Health   Financial Resource Strain: Not on file  Food Insecurity: Not on file  Transportation Needs: Not on file  Physical Activity: Not on file  Stress: Not on file  Social Connections: Not on file   Past Surgical History:  Procedure Laterality Date   CHOLECYSTECTOMY     COLON SURGERY     DILATION AND CURETTAGE OF UTERUS     MAB x 4   HYSTEROSCOPY WITH D & C N/A 02/23/2018   Procedure: DILATATION AND CURETTAGE /HYSTEROSCOPY;  Surgeon: Mora Bellman, MD;  Location: [facility];  Service: Gynecology;  Laterality: N/A;   OVARIAN CYST REMOVAL     Past Surgical History:  Procedure Laterality Date   CHOLECYSTECTOMY     COLON SURGERY     DILATION AND CURETTAGE OF UTERUS     MAB x  4   HYSTEROSCOPY WITH D & C N/A 02/23/2018   Procedure: DILATATION AND CURETTAGE /HYSTEROSCOPY;  Surgeon: Mora Bellman, MD;  Location: [facility];  Service: Gynecology;  Laterality: N/A;   OVARIAN CYST REMOVAL     Past Medical History:  Diagnosis Date   Anemia    Arthritis    hands, knees   Asthma    Back pain    Fibromyalgia    Headache    Hyperlipidemia    Hypertension 11/04/2011   Poor dental hygiene    missing and chipped teeth   Sleep apnea    Uses CPAP nightly   SVD (spontaneous vaginal delivery)    x 1   UTI (lower urinary tract infection)    BP 126/83   Pulse 65   Temp 99.3 F (37.4 C)   Ht '5\' 2"'$  (1.575 m)   Wt 222 lb (100.7 kg)   SpO2 97%   BMI 40.60 kg/m   Opioid Risk Score:   Fall Risk Score:  `1  Depression screen PHQ 2/9  Depression screen [facility] 2/9  03/08/2021 12/14/2020 03/10/2019 11/02/2018 08/23/2015 04/25/2015 04/07/2015  Decreased Interest 0 0 0 0 0 0 0  Down, Depressed, Hopeless - 0 0 2 0 0 0  PHQ - 2 Score 0 0 0 2 0 0 0  Altered sleeping - 1 0 3 - - -  Tired, decreased energy - 1 0 3 - - -  Change in appetite - 3 0 3 - - -  Feeling bad or failure about yourself  - 0 0 0 - - -  Trouble concentrating - 0 0 0 - - -  Moving slowly or fidgety/restless - 0 0 0 - - -  Suicidal thoughts - 0 0 0 - - -  PHQ-9 Score - 5 0 11 - - -  Difficult doing work/chores - Somewhat difficult Not difficult at all - - - -    Review of Systems  Musculoskeletal:  Positive for back pain. Negative for gait problem.       Left shoulder, pain in both hips,hands, knees & feet  All other systems reviewed and are negative.     Objective:   Physical Exam Vitals and nursing note reviewed.  Constitutional:      Appearance: She is obese.  HENT:     Head: Normocephalic and atraumatic.  Eyes:     Extraocular Movements: Extraocular movements intact.     Conjunctiva/sclera: Conjunctivae normal.     Pupils: Pupils are equal, round, and reactive to light.  Cardiovascular:     Rate and Rhythm: Normal rate and regular rhythm.  Pulmonary:     Effort: Pulmonary effort is normal.     Breath sounds: Normal breath sounds.  Abdominal:     General: Abdomen is flat. Bowel sounds are normal.     Palpations: Abdomen is soft.  Musculoskeletal:     Comments: Tenderness to palpation bilateral upper traps bilateral paraspinal in thoracic as well as lumbar regions.  Tenderness over bilateral greater trochanters of the hip. No evidence of knee ankle or foot deformity No evidence of joint swelling in the hands or wrists.  Skin:    General: Skin is warm and dry.     Findings: No erythema.  Neurological:     General: No focal deficit present.     Mental Status: She is alert and oriented to person, place, and time.     Comments: Motor strength is 5/5 bilateral deltoid, bicep,  tricep,  grip, hip flexor, knee extensor, ankle dorsiflexor and plantar          Assessment & Plan:  1.  Fibromyalgia syndrome continue duloxetine 30 mg daily, as discussed with patient she was supposed to be taking pregabalin 200 mg twice daily but has been only taking this once a day.  Monitor for sedation 2.  Fatigue post COVID discussed this would likely improve after another month or 2.  Have encouraged patient to schedule aquatic therapy make up sessions. Physical medicine rehab follow-up in 6 months

## 2021-08-02 LAB — LAB REPORT - SCANNED
EGFR: 77
TSH: 3.2 (ref 0.41–5.90)

## 2021-11-13 ENCOUNTER — Encounter: Payer: Medicare Other | Admitting: Psychology

## 2021-12-11 ENCOUNTER — Encounter: Payer: Medicare Other | Attending: Physical Medicine & Rehabilitation | Admitting: Physical Medicine & Rehabilitation

## 2021-12-11 ENCOUNTER — Other Ambulatory Visit: Payer: Self-pay

## 2021-12-11 ENCOUNTER — Encounter: Payer: Self-pay | Admitting: Physical Medicine & Rehabilitation

## 2021-12-11 VITALS — BP 154/82 | HR 67 | Ht 62.0 in | Wt 227.0 lb

## 2021-12-11 DIAGNOSIS — G8929 Other chronic pain: Secondary | ICD-10-CM | POA: Insufficient documentation

## 2021-12-11 DIAGNOSIS — M79675 Pain in left toe(s): Secondary | ICD-10-CM | POA: Insufficient documentation

## 2021-12-11 DIAGNOSIS — R5381 Other malaise: Secondary | ICD-10-CM | POA: Diagnosis present

## 2021-12-11 MED ORDER — PREGABALIN 200 MG PO CAPS: 200.0000 mg | ORAL_CAPSULE | Freq: Two times a day (BID) | ORAL | 5 refills | Status: DC

## 2021-12-11 NOTE — Progress Notes (Signed)
? ?Subjective:  ? ? Patient ID: Dawn Lucas, female    DOB: September 12, 1956, 66 y.o.   MRN: [id number] ? ?HPI ? ?66 year old female with history of fibromyalgia syndrome who returns today with multifactorial pains widespread body pains.  She complains of back pain neck pain torso pain shoulder pain hip pain but her most concerning pain at this time is ?Left second toe pain.  The patient states that she did not have any obvious injury.  She has not noted any significant swelling.  She has no numbness or tingling.  Patient does not feel like the pain starts in the back and goes down to her toe.  Other comorbidities include asthma GERD and hyperlipidemia ?The patient also feels generally weak, she had COVID at the end of August and has not felt the same since.  Prior to [facility] she underwent aquatic therapy at [facility] [place] ?Did well after water therapy but after covid pt had a set back  ? ?Bilateral ankle swelling, the patient cannot tell me how long this has been going on, no shortness of breath.  We discussed that Lyrica dose was increased last June.  The patient thinks her swelling increased over the last couple months. ?The patient has normal systolic function on her echo dated 09/25/2020.  There was grade 1 diastolic dysfunction, no evidence of neck and valvular heart disease.  Right ankle x-ray from 2018 demonstrates some soft tissue swelling but no joint abnormalities. ?On colchicine for gout ? ?Pain Inventory ?Average Pain 8 ?Pain Right Now 8 ?My pain is sharp and aching ? ?In the last 24 hours, has pain interfered with the following? ?General activity 7 ?Relation with others 8 ?Enjoyment of life 10 ?What TIME of day is your pain at its worst? morning , daytime, evening, and night ?Sleep (in general) Fair ? ?Pain is worse with: walking, bending, sitting, and standing ?Pain improves with: rest and heat/ice ?Relief from Meds:  na ? ?Family History  ?Problem Relation Age of Onset  ? Colon cancer Mother   ?  Diabetes type II Maternal Grandmother   ? Diabetes type II Paternal Grandmother   ? Heart attack Sister   ? HIV Brother   ? Cirrhosis Brother   ? Diabetes Sister   ? HIV Brother   ? ?Social History  ? ?Socioeconomic History  ? Marital status: Single  ?  Spouse name: Not on file  ? Number of children: 1  ? Years of education: Not on file  ? Highest education level: 10th grade  ?Occupational History  ? Occupation: disabled  ?Tobacco Use  ? Smoking status: Every Day  ?  Packs/day: 0.50  ?  Years: 50.00  ?  Pack years: 25.00  ?  Types: Cigarettes  ? Smokeless tobacco: Former  ?  Types: Snuff, Chew  ?Vaping Use  ? Vaping Use: Never used  ?Substance and Sexual Activity  ? Alcohol use: Yes  ?  Comment: rarely   ? Drug use: No  ? Sexual activity: Yes  ?  Birth control/protection: Condom, Post-menopausal  ?Other Topics Concern  ? Not on file  ?Social History Narrative  ? Patient is right-handed. She lives alone.She drinks 3-4 cups of coffee a week, and green tea most days.  ? ?Social Determinants of Health  ? ?Financial Resource Strain: Not on file  ?Food Insecurity: Not on file  ?Transportation Needs: Not on file  ?Physical Activity: Not on file  ?Stress: Not on file  ?Social Connections: Not  on file  ? ?Past Surgical History:  ?Procedure Laterality Date  ? CHOLECYSTECTOMY    ? COLON SURGERY    ? DILATION AND CURETTAGE OF UTERUS    ? MAB x 4  ? HYSTEROSCOPY WITH D & C N/A 02/23/2018  ? Procedure: DILATATION AND CURETTAGE /HYSTEROSCOPY;  Surgeon: Mora Bellman, MD;  Location: [facility];  Service: Gynecology;  Laterality: N/A;  ? OVARIAN CYST REMOVAL    ? ?Past Surgical History:  ?Procedure Laterality Date  ? CHOLECYSTECTOMY    ? COLON SURGERY    ? DILATION AND CURETTAGE OF UTERUS    ? MAB x 4  ? HYSTEROSCOPY WITH D & C N/A 02/23/2018  ? Procedure: DILATATION AND CURETTAGE /HYSTEROSCOPY;  Surgeon: Mora Bellman, MD;  Location: [facility];  Service: Gynecology;  Laterality: N/A;  ? OVARIAN CYST REMOVAL    ? ?Past Medical History:   ?Diagnosis Date  ? Anemia   ? Arthritis   ? hands, knees  ? Asthma   ? Back pain   ? Fibromyalgia   ? Headache   ? Hyperlipidemia   ? Hypertension 11/04/2011  ? Poor dental hygiene   ? missing and chipped teeth  ? Sleep apnea   ? Uses CPAP nightly  ? SVD (spontaneous vaginal delivery)   ? x 1  ? UTI (lower urinary tract infection)   ? ?BP (!) 154/82   Pulse 67   Ht '5\' 2"'$  (1.575 m)   Wt 227 lb (103 kg)   SpO2 99%   BMI 41.52 kg/m?  ? ?Opioid Risk Score:   ?Fall Risk Score:  `1 ? ?Depression screen PHQ 2/9 ? ?Depression screen [facility] 2/9 12/11/2021 06/12/2021 03/08/2021 12/14/2020 03/10/2019 11/02/2018 08/23/2015  ?Decreased Interest 0 0 0 0 0 0 0  ?Down, Depressed, Hopeless 1 0 - 0 0 2 0  ?PHQ - 2 Score 1 0 0 0 0 2 0  ?Altered sleeping - - - 1 0 3 -  ?Tired, decreased energy - - - 1 0 3 -  ?Change in appetite - - - 3 0 3 -  ?Feeling bad or failure about yourself  - - - 0 0 0 -  ?Trouble concentrating - - - 0 0 0 -  ?Moving slowly or fidgety/restless - - - 0 0 0 -  ?Suicidal thoughts - - - 0 0 0 -  ?PHQ-9 Score - - - 5 0 11 -  ?Difficult doing work/chores - - - Somewhat difficult Not difficult at all - -  ?  ? ?Review of Systems  ?Musculoskeletal:  Positive for back pain.  ?     Bilateral shoulder, knee, hips, heel, wrist pain  ?All other systems reviewed and are negative. ? ?   ?Objective:  ? Physical Exam ?Vitals and nursing note reviewed.  ?Constitutional:   ?   Appearance: She is obese.  ?HENT:  ?   Head: Normocephalic and atraumatic.  ?Eyes:  ?   Extraocular Movements: Extraocular movements intact.  ?   Conjunctiva/sclera: Conjunctivae normal.  ?   Pupils: Pupils are equal, round, and reactive to light.  ?Neurological:  ?   Mental Status: She is alert.  ? ?Left foot no evidence of swelling at the toes, no pain with active range of motion of the toes the patient does have some pain with left second toe passive flexion extension, there is no evidence of erythema or skin discoloration, nailbed looks normal.  Feet are warm  bilaterally. ?There is pretibial edema bilaterally  as well as ankle edema bilaterally.  She can ambulate without assistive device no evidence of antalgia. ?Musculoskeletal patient has tenderness palpation along the cervical thoracic and lumbar paraspinal region as well as around bilateral hips. ?Pain is with even light palpation. ?The patient has good shoulder range of motion. ? ? ? ? ?   ?Assessment & Plan:  ?1.  Fibromyalgia syndrome with widespread body pain continue Lyrica 200 mg twice daily, we discussed that this could be a cause of ankle edema however the onset of her edema seems to be about 6 months after she started the Lyrica increased dose.  She is listed as taking furosemide 40 mg/day although she could not tell me whether or not she is actually taking this medicine.  Last recorded prescription was submitted 1 year ago by historical provider ?Refer for aquatic physical therapy, was doing well after she completed this in August but shortly thereafter developed COVID.  She still has some deconditioning after her COVID infection. ?2.  Left second toe pain exam unremarkable except for pain with range of motion.  She has no history of trauma.  Do not think this is radicular pain, she is on colchicine for gout, do not see any recent sed rate or uric acid level in epic results review, will make referral to podiatry ?Medical make referral to ? ?

## 2021-12-11 NOTE — Patient Instructions (Signed)
PLEASE BUY DICLOFENAC GEL AND RUB ON FINGERS 3 TIMES A DAY ?

## 2021-12-20 ENCOUNTER — Ambulatory Visit (INDEPENDENT_AMBULATORY_CARE_PROVIDER_SITE_OTHER): Payer: Medicare Other

## 2021-12-20 ENCOUNTER — Encounter: Payer: Self-pay | Admitting: Podiatry

## 2021-12-20 ENCOUNTER — Other Ambulatory Visit: Payer: Self-pay

## 2021-12-20 ENCOUNTER — Ambulatory Visit (INDEPENDENT_AMBULATORY_CARE_PROVIDER_SITE_OTHER): Payer: Medicare Other | Admitting: Podiatry

## 2021-12-20 DIAGNOSIS — Z860100 Personal history of colon polyps, unspecified: Secondary | ICD-10-CM | POA: Insufficient documentation

## 2021-12-20 DIAGNOSIS — M7751 Other enthesopathy of right foot: Secondary | ICD-10-CM | POA: Diagnosis not present

## 2021-12-20 DIAGNOSIS — M7752 Other enthesopathy of left foot: Secondary | ICD-10-CM | POA: Diagnosis not present

## 2021-12-20 MED ORDER — TRIAMCINOLONE ACETONIDE 40 MG/ML IJ SUSP
40.0000 mg | Freq: Once | INTRAMUSCULAR | Status: AC
  Administered 2021-12-20: 40 mg

## 2021-12-20 NOTE — Progress Notes (Signed)
?Subjective:  ?Patient ID: AN LANNAN, female    DOB: 01-Jul-1956,  MRN: [id number] ?HPI ?No chief complaint on file. ? ? ?66 y.o. female presents with the above complaint.  ? ?ROS: Denies fever chills nausea vomiting muscle aches pains calf pain back pain chest pain shortness of breath. ? ?Past Medical History:  ?Diagnosis Date  ? Anemia   ? Arthritis   ? hands, knees  ? Asthma   ? Back pain   ? Fibromyalgia   ? Headache   ? Hyperlipidemia   ? Hypertension 11/04/2011  ? Poor dental hygiene   ? missing and chipped teeth  ? Sleep apnea   ? Uses CPAP nightly  ? SVD (spontaneous vaginal delivery)   ? x 1  ? UTI (lower urinary tract infection)   ? ?Past Surgical History:  ?Procedure Laterality Date  ? CHOLECYSTECTOMY    ? COLON SURGERY    ? DILATION AND CURETTAGE OF UTERUS    ? MAB x 4  ? HYSTEROSCOPY WITH D & C N/A 02/23/2018  ? Procedure: DILATATION AND CURETTAGE /HYSTEROSCOPY;  Surgeon: Mora Bellman, MD;  Location: [facility];  Service: Gynecology;  Laterality: N/A;  ? OVARIAN CYST REMOVAL    ? ? ?Current Outpatient Medications:  ?  albuterol (VENTOLIN HFA) 108 (90 Base) MCG/ACT inhaler, Inhale 2 puffs into the lungs every 6 (six) hours as needed for wheezing or shortness of breath., Disp: 18 g, Rfl: 2 ?  Ascorbic Acid (VITAMIN C) 125 MG CHEW, Chew 1 tablet by mouth daily., Disp: , Rfl:  ?  atorvastatin (LIPITOR) 20 MG tablet, Take 20 mg by mouth daily., Disp: , Rfl:  ?  calcium-vitamin D (OSCAL WITH D) 500-200 MG-UNIT tablet, Take 1 tablet by mouth., Disp: , Rfl:  ?  Cholecalciferol (VITAMIN D3) 25 MCG (1000 UT) CAPS, Take 1 capsule by mouth daily., Disp: , Rfl:  ?  colchicine 0.6 MG tablet, Take 0.6 mg by mouth 2 (two) times daily. As needed for gout flare, Disp: , Rfl:  ?  cromolyn (OPTICROM) 4 % ophthalmic solution, Place 1 drop into both eyes daily., Disp: , Rfl:  ?  diphenhydrAMINE (BENADRYL) 25 MG tablet, Take 1 tablet (25 mg total) by mouth 3 (three) times daily. Take one tablet three times daily for two  days, Disp: 10 tablet, Rfl: 0 ?  DULoxetine (CYMBALTA) 60 MG capsule, Take 60 mg by mouth daily., Disp: , Rfl:  ?  furosemide (LASIX) 40 MG tablet, Take 40 mg by mouth daily., Disp: , Rfl:  ?  glipiZIDE (GLUCOTROL) 10 MG tablet, Take 10 mg by mouth daily., Disp: , Rfl:  ?  losartan (COZAAR) 50 MG tablet, Take 50 mg by mouth daily., Disp: , Rfl:  ?  meloxicam (MOBIC) 15 MG tablet, Take 15 mg by mouth daily., Disp: , Rfl:  ?  montelukast (SINGULAIR) 10 MG tablet, Take 10 mg by mouth daily., Disp: , Rfl:  ?  omeprazole (PRILOSEC) 40 MG capsule, Take 40 mg by mouth daily., Disp: , Rfl:  ?  pantoprazole (PROTONIX) 20 MG tablet, Take 1 tablet by mouth daily., Disp: , Rfl:  ?  pregabalin (LYRICA) 200 MG capsule, Take 1 capsule (200 mg total) by mouth 2 (two) times daily., Disp: 60 capsule, Rfl: 5 ?  Probiotic Product (PROBIOTIC-10 PO), Take by mouth., Disp: , Rfl:  ?  SYMBICORT 80-4.5 MCG/ACT inhaler, Inhale 2 puffs into the lungs 2 (two) times daily., Disp: , Rfl:  ? ?Allergies  ?Allergen Reactions  ?  Contrast Media [Iodinated Contrast Media] Itching, Swelling and Rash  ?  Cardiac CTA on 01/08/21. Began having rash, hives, itching, facial swelling, and neck swelling on 01/09/21. No improvement with PO benadryl on 01/09/21.  ? Quinolones Itching and Rash  ?  Other reaction(s): rash  ? Sulfa Antibiotics Rash  ? ?Review of Systems ?Objective:  ?There were no vitals filed for this visit. ? ?General: Well developed, nourished, in no acute distress, alert and oriented x3  ? ?Dermatological: Skin is warm, dry and supple bilateral. Nails x 10 are well maintained; remaining integument appears unremarkable at this time. There are no open sores, no preulcerative lesions, no rash or signs of infection present. ? ?Vascular: Dorsalis Pedis artery and Posterior Tibial artery pedal pulses are 2/4 bilateral with immedate capillary fill time. Pedal hair growth present. No varicosities and no lower extremity edema present bilateral.   ? ?Neruologic: Grossly intact via light touch bilateral. Vibratory intact via tuning fork bilateral. Protective threshold with Semmes Wienstein monofilament intact to all pedal sites bilateral. Patellar and Achilles deep tendon reflexes 2+ bilateral. No Babinski or clonus noted bilateral.  ? ?Musculoskeletal: No gross boney pedal deformities bilateral. No pain, crepitus, or limitation noted with foot and ankle range of motion bilateral. Muscular strength 5/5 in all groups tested bilateral. ? ?Gait: Unassisted, Nonantalgic.  ? ? ?Radiographs: ? ?Radiographs taken today demonstrate an osseously mature individual right ankle does not demonstrate any type of significant osseous abnormalities within the ankle of the subtalar joint does demonstrate some posterior facet arthritis very early in nature she does have pes planus that is noted. ? ?Contralateral foot demonstrates osseously mature individual with mild to moderate osteopenia she has an increase in the first intermetatarsal angle and a mild hammertoe deformity at the level of the second metatarsal phalangeal joint.  No fractures are identified either foot.  Right ankle demonstrates no pain on range of motion though she does have pain on direct palpation of the sinus tarsi and pain on end range of motion of the subtalar joint right foot.  Hallux valgus deformity is reducible on the left foot with a hammertoe deformity that is painfully reducible at the level of the metatarsal phalangeal joint.  No erythema edema cellulitis drainage or odor. ? ?Assessment & Plan:  ? ?Assessment: Hallux valgus deformity left foot.  Capsulitis with hammertoe deformity second metatarsal phalangeal joint left.  Capsulitis of the subtalar joint right foot. ? ?Plan: Discussed etiology pathology conservative versus surgical therapies at this point I injected her around her second metatarsophalangeal joint with 10 mg of Kenalog 5 mg of Marcaine.  I also injected the subtalar joint right  with 10 mg Kenalog.  Discussed appropriate shoe gear stretching exercise ice therapy sugar modifications. ? ? ? ? ?Aairah Negrette T. Nondalton, DPM ?

## 2021-12-26 ENCOUNTER — Encounter (HOSPITAL_BASED_OUTPATIENT_CLINIC_OR_DEPARTMENT_OTHER): Payer: Self-pay | Admitting: Physical Therapy

## 2021-12-26 ENCOUNTER — Other Ambulatory Visit: Payer: Self-pay

## 2021-12-26 ENCOUNTER — Ambulatory Visit (HOSPITAL_BASED_OUTPATIENT_CLINIC_OR_DEPARTMENT_OTHER): Payer: Medicare Other | Attending: Physical Medicine & Rehabilitation | Admitting: Physical Therapy

## 2021-12-26 DIAGNOSIS — M545 Low back pain, unspecified: Secondary | ICD-10-CM | POA: Insufficient documentation

## 2021-12-26 DIAGNOSIS — G8929 Other chronic pain: Secondary | ICD-10-CM | POA: Diagnosis present

## 2021-12-26 DIAGNOSIS — M6281 Muscle weakness (generalized): Secondary | ICD-10-CM | POA: Insufficient documentation

## 2021-12-26 DIAGNOSIS — R5381 Other malaise: Secondary | ICD-10-CM | POA: Insufficient documentation

## 2021-12-26 DIAGNOSIS — R262 Difficulty in walking, not elsewhere classified: Secondary | ICD-10-CM | POA: Insufficient documentation

## 2021-12-26 NOTE — Therapy (Signed)
?OUTPATIENT PHYSICAL THERAPY LOWER EXTREMITY EVALUATION ? ? ?Patient Name: Dawn Lucas ?MRN: [id number] ?DOB:03/17/1956, 66 y.o., female ?Today's Date: 12/26/2021 ? ? PT End of Session - 12/26/21 1014   ? ? Visit Number 1   ? Number of Visits 16   ? Date for PT Re-Evaluation 02/20/22   ? Authorization Type [facility]/med[place]   ? Progress Note Due on Visit 10   ? PT Start Time 1015   ? PT Stop Time 1100   ? PT Time Calculation (min) 45 min   ? Activity Tolerance Patient limited by fatigue;Patient tolerated treatment well   ? Behavior During Therapy [facility] for tasks assessed/performed   ? ?  ?  ? ?  ? ? ?Past Medical History:  ?Diagnosis Date  ? Anemia   ? Arthritis   ? hands, knees  ? Asthma   ? Back pain   ? Fibromyalgia   ? Headache   ? Hyperlipidemia   ? Hypertension 11/04/2011  ? Poor dental hygiene   ? missing and chipped teeth  ? Sleep apnea   ? Uses CPAP nightly  ? SVD (spontaneous vaginal delivery)   ? x 1  ? UTI (lower urinary tract infection)   ? ?Past Surgical History:  ?Procedure Laterality Date  ? CHOLECYSTECTOMY    ? COLON SURGERY    ? DILATION AND CURETTAGE OF UTERUS    ? MAB x 4  ? HYSTEROSCOPY WITH D & C N/A 02/23/2018  ? Procedure: DILATATION AND CURETTAGE /HYSTEROSCOPY;  Surgeon: Mora Bellman, MD;  Location: [facility];  Service: Gynecology;  Laterality: N/A;  ? OVARIAN CYST REMOVAL    ? ?Patient Active Problem List  ? Diagnosis Date Noted  ? Family history of malignant neoplasm of gastrointestinal tract 12/20/2021  ? Personal history of colonic polyps 12/20/2021  ? Thickened endometrium   ? Prediabetes 04/28/2015  ? Pes planus of both feet 03/28/2015  ? Seborrheic keratoses 08/22/2014  ? Headache(784.0) 05/17/2014  ? Lower extremity edema 09/22/2013  ? Vitamin D deficiency 09/22/2013  ? Tendinitis of left rotator cuff 06/17/2013  ? Right ankle pain 06/17/2013  ? Colon cancer screening 06/17/2013  ? Knee pain, bilateral 03/20/2012  ? Hypertension 11/04/2011  ? Generalized pain 10/15/2011  ? Back pain  02/08/2011  ? Obese 02/08/2011  ? Tobacco abuse 02/08/2011  ? Sleep disorder 02/08/2011  ? ? ?PCP: Sonia Side., FNP ? ?REFERRING PROVIDER: Charlett Blake, MD ? ?REFERRING DIAG: R53.81 (ICD-10-CM) - Debility ? ?THERAPY DIAG:  ?Muscle weakness (generalized) ? ?Difficulty in walking, not elsewhere classified ? ?Chronic bilateral low back pain without sciatica ? ?ONSET DATE: Aug 2022 ? ?SUBJECTIVE:  ? ?SUBJECTIVE STATEMENT: ?"I haven't been right since having covid, can't breathe well, tired all the time, back hurts. Had to stop multiple times on my way down to the pool, using my inhaler often"  ? ?PERTINENT HISTORY: ?Debility due to covid ?Fibromyalgia syndrome with widespread body pain ?Ankle edema ?Gout ?Asthma ?HTN ? ?PAIN:  ?Are you having pain? Yes: NPRS scale: 2/10 current worse 6/10 min 1/10 ?Pain location: LB, buttocks,hips  ?Pain description: achey, occasionally sharp ?Aggravating factors: walking, standing ?Relieving factors: sitting, resting ? ?PRECAUTIONS: None ? ?WEIGHT BEARING RESTRICTIONS No ? ?FALLS:  ?Has patient fallen in last 6 months? No, Number of falls: 0 ? ?LIVING ENVIRONMENT: ?Lives with: lives alone ?Lives in: House/apartment ?Stairs: No;  ?Has following equipment at home: None ? ?OCCUPATION: retired ? ?PLOF: Independent with basic ADLs ? ?PATIENT GOALS  walk better with less pain and less SOB ? ? ?OBJECTIVE:  ? ?DIAGNOSTIC FINDINGS: Hallux valgus deformity left foot.  Capsulitis with hammertoe deformity second metatarsal phalangeal joint left.  Capsulitis of the subtalar joint right foot. ? ?PATIENT SURVEYS:  ?FOTO 52  with predicted goal of: 79 ? ?COGNITION: ? Overall cognitive status: Within functional limits for tasks assessed   ?  ?SENSATION: ?WFL ?Ankles/feet swollen ? ? ?POSTURE:  ?Knee hyper extension, bilateral valgus deformity knees ? ?PALPATION: ?Minimally sensitive with deep pressure LB ? ?LE ROM: ? ?Active ROM Right ?12/26/2021 Left ?12/26/2021  ?Hip flexion 110 limited by  pain wfl  ?Hip extension    ?Hip abduction    ?Hip adduction    ?Hip internal rotation    ?Hip external rotation    ?Knee flexion    ?Knee extension    ?Ankle dorsiflexion    ?Ankle plantarflexion    ?Ankle inversion    ?Ankle eversion    ? (Blank rows = not tested) ? ?LE MMT: ? ?MMT Right ?12/26/2021 Left ?12/26/2021  ?Hip flexion 3+/5 limited to pain 4/5  ?Hip extension    ?Hip abduction 5 5  ?Hip adduction 5 5  ?    ?    ?Knee flexion 5 5  ?Knee extension 5 5  ? Ankle 5/5 ? ?Lumbar spine ROM: flexion 10% limited to pain ?   Side bending R/L 75%  ?    Left rotation 25% limited to pain/Right 50% ? ?FUNCTIONAL TESTS:  ?5 times sit to stand: 22 ?Timed up and go (TUG): 20 ?3 minute walk test O2 sats drop to 85% 321f ? ?GAIT: ?Distance walked: 300 ?Assistive device utilized: None ?Level of assistance: Modified independence requires frequent rest periods ?Comments: cadence slowed, decreased arm swing, guarded ? ? ? ?TODAY'S TREATMENT: ?Evaluation; HEP instruction; gait training ? ? ?PATIENT EDUCATION:  ?Education details: management of condition ?Person educated: Patient ?Education method: Explanation ?Education comprehension: verbalized understanding ? ? ?HOME EXERCISE PROGRAM: ?[id number] from previous episode ?Pt instructed to walk daily x 10 minutes resting as needed. ? ?ASSESSMENT: ? ?CLINICAL IMPRESSION: ?Patient is a 66y.o. F who was seen today for physical therapy evaluation and treatment for debility after bout with covid in Aug 2022. She was seen here and dc'ed in early aug 2022 progressing very well met all goals with resolved LBP, shoulder pain and improved aerobic capacity, walking to and from bus stop (up hill) and walking through grocery stores.  She is reporting she has lost all gains and gained weight since covid, has not been able to get her energy up constantly fatigued with return of LBP.  She will benefit from skilled PT for return to PLOF ? ? ?OBJECTIVE IMPAIRMENTS cardiopulmonary status limiting  activity, decreased activity tolerance, decreased endurance, decreased mobility, difficulty walking, and decreased strength.  ? ?ACTIVITY LIMITATIONS cleaning, community activity, meal prep, shopping, and church.  ? ?PERSONAL FACTORS Age, Fitness, and 3+ comorbidities: asthma, fibromyalgia, HTN  and gout are also affecting patient's functional outcome.  ? ? ?REHAB POTENTIAL: Good ? ?CLINICAL DECISION MAKING: Evolving/moderate complexity ? ?EVALUATION COMPLEXITY: Moderate ? ? ?GOALS: ?Goals reviewed with patient? Yes ? ?SHORT TERM GOALS: Target date: 01/23/2022 ? ?Pt will walk from parking lot to pool without rest period ?Baseline: 2 ?Goal status: INITIAL ? ?2.  Pt to be indep and complaint with land based HEP ?Baseline: stopped completing ?Goal status: INITIAL ? ?3.  Pt will decrease max LBP to <4/10 ?Baseline: 6/10 ?Goal status: INITIAL ? ?  4.  Pt will improve on 5x STS test to under 18s to demonstrate improved strength and decrease pain ?Baseline: 22s ?Goal status: INITIAL ? ? ? ?LONG TERM GOALS: Target date: 02/20/2022 ? ?Pt will amb distance from bus stop to pool with no more than 2 rest periods ?Baseline: 5-6 ?Goal status: INITIAL ? ?2.  Foto to meet stated goal ?Baseline: 52 ?Goal status: INITIAL ? ?3.  Pt will improve on 3 minute walk test to >=650 ft ?Baseline: 354f ?Goal status: INITIAL ? ?4.  Pt will improve on TUG test to <=12s to demonstrate improved mobility and balance ?Timed up and go (TUG): 20 ?Baseline: 20s ?Goal status: INITIAL ? ? ? ? ?PLAN: ?PT FREQUENCY: 1-2x/week ? ?PT DURATION: 8 weeks ? ?PLANNED INTERVENTIONS: Therapeutic exercises, Therapeutic activity, Neuromuscular re-education, Balance training, Gait training, Patient/Family education, Joint mobilization, Stair training, Aquatic Therapy, Taping, and Manual therapy ? ?PLAN FOR NEXT SESSION: aquatics for core and LE strengthening, aerobic capacity retraining, Balance and stretching ? ? ?MStanton Kidney(Tharon Aquas Draycen Leichter MPT ?12/26/2021, 12:50 PM  ?

## 2022-01-08 ENCOUNTER — Ambulatory Visit (HOSPITAL_BASED_OUTPATIENT_CLINIC_OR_DEPARTMENT_OTHER): Payer: Medicare Other | Attending: Physical Medicine & Rehabilitation | Admitting: Physical Therapy

## 2022-01-08 ENCOUNTER — Encounter (HOSPITAL_BASED_OUTPATIENT_CLINIC_OR_DEPARTMENT_OTHER): Payer: Self-pay | Admitting: Physical Therapy

## 2022-01-08 DIAGNOSIS — M6281 Muscle weakness (generalized): Secondary | ICD-10-CM | POA: Insufficient documentation

## 2022-01-08 DIAGNOSIS — R262 Difficulty in walking, not elsewhere classified: Secondary | ICD-10-CM | POA: Insufficient documentation

## 2022-01-08 DIAGNOSIS — G8929 Other chronic pain: Secondary | ICD-10-CM | POA: Insufficient documentation

## 2022-01-08 DIAGNOSIS — M5459 Other low back pain: Secondary | ICD-10-CM | POA: Insufficient documentation

## 2022-01-08 DIAGNOSIS — M545 Low back pain, unspecified: Secondary | ICD-10-CM | POA: Insufficient documentation

## 2022-01-08 NOTE — Therapy (Signed)
?OUTPATIENT PHYSICAL THERAPY TREATMENT NOTE ? ? ?Patient Name: Dawn Lucas ?MRN: [id number] ?DOB:02-Mar-1956, 66 y.o., female ?Today's Date: 01/08/2022 ? ?PCP: Sonia Side., FNP ?REFERRING PROVIDER: Sonia Side., FNP ? ?END OF SESSION:  ? PT End of Session - 01/08/22 1633   ? ? Visit Number 2   ? Number of Visits 16   ? Date for PT Re-Evaluation 02/20/22   ? Authorization Type [employer]/med[place]   ? PT Start Time 1615   ? PT Stop Time 3202   ? PT Time Calculation (min) 35 min   ? Activity Tolerance Patient limited by pain   ? ?  ?  ? ?  ? ? ?Past Medical History:  ?Diagnosis Date  ? Anemia   ? Arthritis   ? hands, knees  ? Asthma   ? Back pain   ? Fibromyalgia   ? Headache   ? Hyperlipidemia   ? Hypertension 11/04/2011  ? Poor dental hygiene   ? missing and chipped teeth  ? Sleep apnea   ? Uses CPAP nightly  ? SVD (spontaneous vaginal delivery)   ? x 1  ? UTI (lower urinary tract infection)   ? ?Past Surgical History:  ?Procedure Laterality Date  ? CHOLECYSTECTOMY    ? COLON SURGERY    ? DILATION AND CURETTAGE OF UTERUS    ? MAB x 4  ? HYSTEROSCOPY WITH D & C N/A 02/23/2018  ? Procedure: DILATATION AND CURETTAGE /HYSTEROSCOPY;  Surgeon: Mora Bellman, MD;  Location: [facility];  Service: Gynecology;  Laterality: N/A;  ? OVARIAN CYST REMOVAL    ? ?Patient Active Problem List  ? Diagnosis Date Noted  ? Family history of malignant neoplasm of gastrointestinal tract 12/20/2021  ? Personal history of colonic polyps 12/20/2021  ? Thickened endometrium   ? Prediabetes 04/28/2015  ? Pes planus of both feet 03/28/2015  ? Seborrheic keratoses 08/22/2014  ? Headache(784.0) 05/17/2014  ? Lower extremity edema 09/22/2013  ? Vitamin D deficiency 09/22/2013  ? Tendinitis of left rotator cuff 06/17/2013  ? Right ankle pain 06/17/2013  ? Colon cancer screening 06/17/2013  ? Knee pain, bilateral 03/20/2012  ? Hypertension 11/04/2011  ? Generalized pain 10/15/2011  ? Back pain 02/08/2011  ? Obese 02/08/2011  ? Tobacco abuse 02/08/2011   ? Sleep disorder 02/08/2011  ? ? ?REFERRING DIAG:  R53.81 (ICD-10-CM) - Debility ?  ? ?THERAPY DIAG:  ?Muscle weakness (generalized) ? ?Difficulty in walking, not elsewhere classified ? ?Chronic bilateral low back pain without sciatica ? ?PERTINENT HISTORY: Debility due to covid ?Fibromyalgia syndrome with widespread body pain ?Ankle edema ?Gout ?Asthma ?HTN ? ?PRECAUTIONS: none ? ?SUBJECTIVE: "I'm under a lot of stress, hurting bad" ? ?PAIN:  ?Are you having pain? Yes: NPRS scale: 6/10 current worse 6/10 min 1/10 ?Pain location: LB, buttocks,hips  ?Pain description: achey, occasionally sharp ?Aggravating factors: walking, standing ?Relieving factors: sitting, resting ?  ?PRECAUTIONS: None ?  ?WEIGHT BEARING RESTRICTIONS No ?  ?FALLS:  ?Has patient fallen in last 6 months? No, Number of falls: 0 ?  ?LIVING ENVIRONMENT: ?Lives with: lives alone ?Lives in: House/apartment ?Stairs: No;  ?Has following equipment at home: None ?  ?OCCUPATION: retired ?  ?PLOF: Independent with basic ADLs ?  ?PATIENT GOALS  walk better with less pain and less SOB ?  ?  ?OBJECTIVE:  ?  ?DIAGNOSTIC FINDINGS: Hallux valgus deformity left foot.  Capsulitis with hammertoe deformity second metatarsal phalangeal joint left.  Capsulitis of the subtalar joint right  foot. ?  ?PATIENT SURVEYS:  ?FOTO 52  with predicted goal of: 82 ?  ?COGNITION: ?          Overall cognitive status: Within functional limits for tasks assessed               ?           ?SENSATION: ?WFL ?Ankles/feet swollen ?  ?  ?POSTURE:  ?Knee hyper extension, bilateral valgus deformity knees ?  ?PALPATION: ?Minimally sensitive with deep pressure LB ?  ?LE ROM: ?  ?Active ROM Right ?12/26/2021 Left ?12/26/2021  ?Hip flexion 110 limited by pain wfl  ?Hip extension      ?Hip abduction      ?Hip adduction      ?Hip internal rotation      ?Hip external rotation      ?Knee flexion      ?Knee extension      ?Ankle dorsiflexion      ?Ankle plantarflexion      ?Ankle inversion      ?Ankle  eversion      ? (Blank rows = not tested) ?  ?LE MMT: ?  ?MMT Right ?12/26/2021 Left ?12/26/2021  ?Hip flexion 3+/5 limited to pain 4/5  ?Hip extension      ?Hip abduction 5 5  ?Hip adduction 5 5  ?       ?       ?Knee flexion 5 5  ?Knee extension 5 5  ? Ankle 5/5 ?  ?Lumbar spine ROM: flexion 10% limited to pain ?                                 Side bending R/L 75%  ?                                           Left rotation 25% limited to pain/Right 50% ?  ?FUNCTIONAL TESTS:  ?5 times sit to stand: 22 ?Timed up and go (TUG): 20 ?3 minute walk test O2 sats drop to 85% 343f ?  ?GAIT: ?Distance walked: 300 ?Assistive device utilized: None ?Level of assistance: Modified independence requires frequent rest periods ?Comments: cadence slowed, decreased arm swing, guarded ?  ?  ?  ?TODAY'S TREATMENT: ?Pt seen for aquatic therapy today.  Treatment took place in water 3.25-4.8 ft [facility] Temp of water was 91?.  Pt entered/exited the pool via stairs step to independently with bilat rail.  ? ?Re-intro to setting ?Walking ue supported by yellow noodle 8 widths forward, back and side stepping ? ?Standing ue supported by wall: ?LB stretch 3x30s ?-hip flex and quad stretch R/L 3x20s ?-hip extension x10; hip add/abd 2x10 ?-Kick board push down with core engagement 3x20 ?-lumbar rotation 3x20 ? ?Side lunge ue supported on yellow noodle ? ? ?Pt requires buoyancy for support and to offload joints with strengthening exercises. Viscosity of the water is needed for resistance of strengthening; water current perturbations provides challenge to standing balance unsupported, requiring increased core activation. ? ?  ?  ?PATIENT EDUCATION:  ?Education details: management of condition ?Person educated: Patient ?Education method: Explanation ?Education comprehension: verbalized understanding ?  ?  ?HOME EXERCISE PROGRAM: ?[id number] from previous episode ?Pt instructed to walk daily x 10 minutes resting as  needed. ?  ?  ASSESSMENT: ?  ?CLINICAL IMPRESSION: ?Pt worked up over personal issue with increased overall pain reports through shoulders, c-spine and LB. She requires constant verbal cues for shoulder relaxation and deep breathing.  Had difficulty getting pt's time on task. She tolerates session fair cutting visit short due to pain intolerance. She uses Jacuzzi (unbilled) for water massage at end of session.   ? ?Patient is a 66 y.o. F who was seen today for physical therapy evaluation and treatment for debility after bout with covid in Aug 2022. She was seen here and dc'ed in early aug 2022 progressing very well met all goals with resolved LBP, shoulder pain and improved aerobic capacity, walking to and from bus stop (up hill) and walking through grocery stores.  She is reporting she has lost all gains and gained weight since covid, has not been able to get her energy up constantly fatigued with return of LBP.  She will benefit from skilled PT for return to PLOF ?  ?  ?OBJECTIVE IMPAIRMENTS cardiopulmonary status limiting activity, decreased activity tolerance, decreased endurance, decreased mobility, difficulty walking, and decreased strength.  ?  ?ACTIVITY LIMITATIONS cleaning, community activity, meal prep, shopping, and church.  ?  ?PERSONAL FACTORS Age, Fitness, and 3+ comorbidities: asthma, fibromyalgia, HTN  and gout are also affecting patient's functional outcome.  ?  ?  ?REHAB POTENTIAL: Good ?  ?CLINICAL DECISION MAKING: Evolving/moderate complexity ?  ?EVALUATION COMPLEXITY: Moderate ?  ?  ?GOALS: ?Goals reviewed with patient? Yes ?  ?SHORT TERM GOALS: Target date: 01/23/2022 ?  ?Pt will walk from parking lot to pool without rest period ?Baseline: 2 ?Goal status: INITIAL ?  ?2.  Pt to be indep and complaint with land based HEP ?Baseline: stopped completing ?Goal status: INITIAL ?  ?3.  Pt will decrease max LBP to <4/10 ?Baseline: 6/10 ?Goal status: INITIAL ?  ?4.  Pt will improve on 5x STS test to under  18s to demonstrate improved strength and decrease pain ?Baseline: 22s ?Goal status: INITIAL ?  ?  ?  ?LONG TERM GOALS: Target date: 02/20/2022 ?  ?Pt will amb distance from bus stop to pool with no more than

## 2022-01-14 ENCOUNTER — Ambulatory Visit (HOSPITAL_BASED_OUTPATIENT_CLINIC_OR_DEPARTMENT_OTHER): Payer: Medicare Other | Admitting: Physical Therapy

## 2022-01-14 ENCOUNTER — Telehealth (HOSPITAL_BASED_OUTPATIENT_CLINIC_OR_DEPARTMENT_OTHER): Payer: Self-pay | Admitting: Physical Therapy

## 2022-01-14 NOTE — Telephone Encounter (Signed)
Patient did not show for PT appointment.  Called and left HIPAA compliant voice mail regarding missed appt; stated next upcoming appt.  Requested she call back to confirm next appt.   [phone] ?Kerin Perna, PTA ?01/14/22 10:33 AM ? ?

## 2022-01-18 ENCOUNTER — Ambulatory Visit (HOSPITAL_BASED_OUTPATIENT_CLINIC_OR_DEPARTMENT_OTHER): Payer: Medicare Other | Admitting: Physical Therapy

## 2022-01-18 ENCOUNTER — Encounter (HOSPITAL_BASED_OUTPATIENT_CLINIC_OR_DEPARTMENT_OTHER): Payer: Self-pay | Admitting: Physical Therapy

## 2022-01-18 DIAGNOSIS — R262 Difficulty in walking, not elsewhere classified: Secondary | ICD-10-CM

## 2022-01-18 DIAGNOSIS — M6281 Muscle weakness (generalized): Secondary | ICD-10-CM | POA: Diagnosis not present

## 2022-01-18 DIAGNOSIS — M545 Other chronic pain: Secondary | ICD-10-CM

## 2022-01-18 NOTE — Therapy (Signed)
?OUTPATIENT PHYSICAL THERAPY TREATMENT NOTE ? ? ?Patient Name: Dawn Lucas ?MRN: [id number] ?DOB:08/22/1956, 66 y.o., female ?Today's Date: 01/18/2022 ? ?PCP: Sonia Side., FNP ?REFERRING PROVIDER: Sonia Side., FNP ? ?END OF SESSION:  ? PT End of Session - 01/18/22 1207   ? ? Visit Number 3   ? Number of Visits 16   ? Date for PT Re-Evaluation 02/20/22   ? Authorization Type [place]med[place]   ? PT Start Time 1157   ? PT Stop Time 1238   ? PT Time Calculation (min) 41 min   ? Activity Tolerance Patient tolerated treatment well   ? Behavior During Therapy [facility] for tasks assessed/performed   ? ?  ?  ? ?  ? ? ?Past Medical History:  ?Diagnosis Date  ? Anemia   ? Arthritis   ? hands, knees  ? Asthma   ? Back pain   ? Fibromyalgia   ? Headache   ? Hyperlipidemia   ? Hypertension 11/04/2011  ? Poor dental hygiene   ? missing and chipped teeth  ? Sleep apnea   ? Uses CPAP nightly  ? SVD (spontaneous vaginal delivery)   ? x 1  ? UTI (lower urinary tract infection)   ? ?Past Surgical History:  ?Procedure Laterality Date  ? CHOLECYSTECTOMY    ? COLON SURGERY    ? DILATION AND CURETTAGE OF UTERUS    ? MAB x 4  ? HYSTEROSCOPY WITH D & C N/A 02/23/2018  ? Procedure: DILATATION AND CURETTAGE /HYSTEROSCOPY;  Surgeon: Mora Bellman, MD;  Location: [facility];  Service: Gynecology;  Laterality: N/A;  ? OVARIAN CYST REMOVAL    ? ?Patient Active Problem List  ? Diagnosis Date Noted  ? Family history of malignant neoplasm of gastrointestinal tract 12/20/2021  ? Personal history of colonic polyps 12/20/2021  ? Thickened endometrium   ? Prediabetes 04/28/2015  ? Pes planus of both feet 03/28/2015  ? Seborrheic keratoses 08/22/2014  ? Headache(784.0) 05/17/2014  ? Lower extremity edema 09/22/2013  ? Vitamin D deficiency 09/22/2013  ? Tendinitis of left rotator cuff 06/17/2013  ? Right ankle pain 06/17/2013  ? Colon cancer screening 06/17/2013  ? Knee pain, bilateral 03/20/2012  ? Hypertension 11/04/2011  ? Generalized pain 10/15/2011   ? Back pain 02/08/2011  ? Obese 02/08/2011  ? Tobacco abuse 02/08/2011  ? Sleep disorder 02/08/2011  ? ? ?REFERRING DIAG:  R53.81 (ICD-10-CM) - Debility ?  ? ?THERAPY DIAG:  ?Muscle weakness (generalized) ? ?Difficulty in walking, not elsewhere classified ? ?Chronic bilateral low back pain without sciatica ? ?PERTINENT HISTORY: Debility due to covid ?Fibromyalgia syndrome with widespread body pain ?Ankle edema ?Gout ?Asthma ?HTN ? ?PRECAUTIONS: none ? ?SUBJECTIVE: Pt voices frustration with transportation making her arrive late.  She reports that her feet feel swollen today. She voices that she is discouraged that she has to start all over again with therapy; that she was doing so well before she had covid last year.  ? ?PAIN:  ?Are you having pain? Yes ?NPRS scale: 4/10  ?Pain location: LB ?Pain description: achey, occasionally sharp ?Aggravating factors: walking, standing ?Relieving factors: sitting, resting ?  ?PRECAUTIONS: None ?  ?WEIGHT BEARING RESTRICTIONS No ?  ?FALLS:  ?Has patient fallen in last 6 months? No, Number of falls: 0 ?  ?LIVING ENVIRONMENT: ?Lives with: lives alone ?Lives in: House/apartment ?Stairs: No;  ?Has following equipment at home: None ?  ?OCCUPATION: retired ?  ?PLOF: Independent with basic ADLs ?  ?  PATIENT GOALS  walk better with less pain and less SOB ?  ?  ?OBJECTIVE:  ?  ?DIAGNOSTIC FINDINGS: Hallux valgus deformity left foot.  Capsulitis with hammertoe deformity second metatarsal phalangeal joint left.  Capsulitis of the subtalar joint right foot. ?  ?PATIENT SURVEYS:  ?FOTO 52  with predicted goal of: 45 ?  ?COGNITION: ?          Overall cognitive status: Within functional limits for tasks assessed               ?           ?SENSATION: ?WFL ?Ankles/feet swollen ?  ?  ?POSTURE:  ?Knee hyper extension, bilateral valgus deformity knees ?  ?PALPATION: ?Minimally sensitive with deep pressure LB ?  ?LE ROM: ?  ?Active ROM Right ?12/26/2021 Left ?12/26/2021  ?Hip flexion 110 limited by  pain wfl  ?Hip extension      ?Hip abduction      ?Hip adduction      ?Hip internal rotation      ?Hip external rotation      ?Knee flexion      ?Knee extension      ?Ankle dorsiflexion      ?Ankle plantarflexion      ?Ankle inversion      ?Ankle eversion      ? (Blank rows = not tested) ?  ?LE MMT: ?  ?MMT Right ?12/26/2021 Left ?12/26/2021  ?Hip flexion 3+/5 limited to pain 4/5  ?Hip extension      ?Hip abduction 5 5  ?Hip adduction 5 5  ?       ?       ?Knee flexion 5 5  ?Knee extension 5 5  ? Ankle 5/5 ?  ?Lumbar spine ROM: flexion 10% limited to pain ?                                 Side bending R/L 75%  ?                                           Left rotation 25% limited to pain/Right 50% ?  ?FUNCTIONAL TESTS:  ?5 times sit to stand: 22 ?Timed up and go (TUG): 20 ?3 minute walk test O2 sats drop to 85% 351f ?  ?GAIT: ?Distance walked: 300 ?Assistive device utilized: None ?Level of assistance: Modified independence requires frequent rest periods ?Comments: cadence slowed, decreased arm swing, guarded ?  ?  ?  ?TODAY'S TREATMENT: ?Pt seen for aquatic therapy today.  Treatment took place in water 3.25-4 ft [facility] Temp of water was 93?.Marland Kitchen Pt entered/exited the pool via stairs step to independently with bilat rail.  ? ? ?Warm up:  Walking 3 laps each forward, back and side stepping, without UE support ? ?Standing UE supported by wall: ?Heel / toe raises x 10  ?L stretch for shoulder / LB  x 10 sec x 2 ?hip extension x10 each ?hip add/abd x 10 ?Squats x 6 ?Forward/ backward / side stepping 1 lap each repeated ?Hip openers (hip flex/knee flex) x 5 each side, alternating LE; then crossing midline with hip flex/knee flex x 5 each side ?Sit to/from stand on bench in water x 10 ? ?Pt sat in jacuzzi after session (  unbilled).  ?Pt requires buoyancy for support and to offload joints with strengthening exercises. Viscosity of the water is needed for resistance of strengthening; water  current perturbations provides challenge to standing balance unsupported, requiring increased core activation. ? ?  ?  ?PATIENT EDUCATION:  ?Education details: management of condition ?Person educated: Patient ?Education method: Explanation ?Education comprehension: verbalized understanding ?  ?  ?HOME EXERCISE PROGRAM: ?[id number] from previous episode ?Pt instructed to walk daily x 10 minutes resting as needed. ?  ?ASSESSMENT: ?  ?CLINICAL IMPRESSION: ?Pt arrives ~13 min late due to transportation issues. Pt had difficulty tolerating hip ext, but was able to complete set of 10 with cues to decrease height of leg.  She continues to require cues to stay on task.  Besides hip ext exercise, pt had good tolerance for remainder of exercises. She was able to ambulate in water without UE support and receive direction from therapist on deck without any issues.  Goals are ongoing.  ?  ?OBJECTIVE IMPAIRMENTS cardiopulmonary status limiting activity, decreased activity tolerance, decreased endurance, decreased mobility, difficulty walking, and decreased strength.  ?  ?ACTIVITY LIMITATIONS cleaning, community activity, meal prep, shopping, and church.  ?  ?PERSONAL FACTORS Age, Fitness, and 3+ comorbidities: asthma, fibromyalgia, HTN  and gout are also affecting patient's functional outcome.  ?  ?  ?REHAB POTENTIAL: Good ?  ?CLINICAL DECISION MAKING: Evolving/moderate complexity ?  ?EVALUATION COMPLEXITY: Moderate ?  ?  ?GOALS: ?Goals reviewed with patient? Yes ?  ?SHORT TERM GOALS: Target date: 01/23/2022 ?  ?Pt will walk from parking lot to pool without rest period ?Baseline:  ?Goal status: INITIAL ?  ?2.  Pt to be indep and complaint with land based HEP ?Baseline: stopped completing ?Goal status: INITIAL ?  ?3.  Pt will decrease max LBP to <4/10 ?Baseline: 6/10 ?Goal status: INITIAL ?  ?4.  Pt will improve on 5x STS test to under 18s to demonstrate improved strength and decrease pain ?Baseline: 22s ?Goal status: INITIAL ?  ?   ?  ?LONG TERM GOALS: Target date: 02/20/2022 ?  ?Pt will amb distance from bus stop to pool with no more than 2 rest periods ?Baseline: 5-6 ?Goal status: INITIAL ?  ?2.  Foto to meet stated goal ?Baseline: 52

## 2022-01-23 ENCOUNTER — Encounter (HOSPITAL_BASED_OUTPATIENT_CLINIC_OR_DEPARTMENT_OTHER): Payer: Self-pay | Admitting: Physical Therapy

## 2022-01-23 ENCOUNTER — Ambulatory Visit (HOSPITAL_BASED_OUTPATIENT_CLINIC_OR_DEPARTMENT_OTHER): Payer: Medicare Other | Admitting: Physical Therapy

## 2022-01-23 DIAGNOSIS — R262 Difficulty in walking, not elsewhere classified: Secondary | ICD-10-CM

## 2022-01-23 DIAGNOSIS — M6281 Muscle weakness (generalized): Secondary | ICD-10-CM

## 2022-01-23 DIAGNOSIS — M545 Low back pain, unspecified: Secondary | ICD-10-CM

## 2022-01-23 DIAGNOSIS — M5459 Other low back pain: Secondary | ICD-10-CM

## 2022-01-23 NOTE — Therapy (Addendum)
?OUTPATIENT PHYSICAL THERAPY TREATMENT NOTE ?Recert due to change in dx code ? ?Patient Name: Dawn Lucas ?MRN: [id number] ?DOB:08/02/1956, 66 y.o., female ?Today's Date: 01/23/2022 ? ?PCP: Sonia Side., FNP ?REFERRING PROVIDER: Sonia Side., FNP ? ?END OF SESSION:  ? PT End of Session - 01/23/22 1056   ? ? Visit Number 4   ? Number of Visits 16   ? Date for PT Re-Evaluation 02/20/22   ? Authorization Type [employer]/med[employer]   ? PT Start Time 1030   ? PT Stop Time 1115   ? PT Time Calculation (min) 45 min   ? Activity Tolerance Patient tolerated treatment well   ? Behavior During Therapy [facility] for tasks assessed/performed   ? ?  ?  ? ?  ? ? ?Past Medical History:  ?Diagnosis Date  ? Anemia   ? Arthritis   ? hands, knees  ? Asthma   ? Back pain   ? Fibromyalgia   ? Headache   ? Hyperlipidemia   ? Hypertension 11/04/2011  ? Poor dental hygiene   ? missing and chipped teeth  ? Sleep apnea   ? Uses CPAP nightly  ? SVD (spontaneous vaginal delivery)   ? x 1  ? UTI (lower urinary tract infection)   ? ?Past Surgical History:  ?Procedure Laterality Date  ? CHOLECYSTECTOMY    ? COLON SURGERY    ? DILATION AND CURETTAGE OF UTERUS    ? MAB x 4  ? HYSTEROSCOPY WITH D & C N/A 02/23/2018  ? Procedure: DILATATION AND CURETTAGE /HYSTEROSCOPY;  Surgeon: Mora Bellman, MD;  Location: [facility];  Service: Gynecology;  Laterality: N/A;  ? OVARIAN CYST REMOVAL    ? ?Patient Active Problem List  ? Diagnosis Date Noted  ? Family history of malignant neoplasm of gastrointestinal tract 12/20/2021  ? Personal history of colonic polyps 12/20/2021  ? Thickened endometrium   ? Prediabetes 04/28/2015  ? Pes planus of both feet 03/28/2015  ? Seborrheic keratoses 08/22/2014  ? Headache(784.0) 05/17/2014  ? Lower extremity edema 09/22/2013  ? Vitamin D deficiency 09/22/2013  ? Tendinitis of left rotator cuff 06/17/2013  ? Right ankle pain 06/17/2013  ? Colon cancer screening 06/17/2013  ? Knee pain, bilateral 03/20/2012  ? Hypertension 11/04/2011   ? Generalized pain 10/15/2011  ? Back pain 02/08/2011  ? Obese 02/08/2011  ? Tobacco abuse 02/08/2011  ? Sleep disorder 02/08/2011  ? ? ?REFERRING DIAG:  R53.81 (ICD-10-CM) - Debility ?  ? ?THERAPY DIAG:  ?Muscle weakness (generalized) ? ?Difficulty in walking, not elsewhere classified ? ?Chronic bilateral low back pain without sciatica ? ?PERTINENT HISTORY: Debility due to covid ?Fibromyalgia syndrome with widespread body pain ?Ankle edema ?Gout ?Asthma ?HTN ? ?PRECAUTIONS: none ? ?SUBJECTIVE: Pt with complaints of transportation issues.  Also reports being tight in her chest, feeling like she has to belch ? ?PAIN:  ?Are you having pain? Yes ?NPRS scale: 6/10  ?Pain location: LB ?Pain description: achey, occasionally sharp ?Aggravating factors: walking, standing ?Relieving factors: sitting, resting ?  ?PRECAUTIONS: None ?  ?WEIGHT BEARING RESTRICTIONS No ?  ?FALLS:  ?Has patient fallen in last 6 months? No, Number of falls: 0 ?  ?LIVING ENVIRONMENT: ?Lives with: lives alone ?Lives in: House/apartment ?Stairs: No;  ?Has following equipment at home: None ?  ?OCCUPATION: retired ?  ?PLOF: Independent with basic ADLs ?  ?PATIENT GOALS  walk better with less pain and less SOB ?  ?  ?OBJECTIVE:  ?  ?DIAGNOSTIC FINDINGS:  Hallux valgus deformity left foot.  Capsulitis with hammertoe deformity second metatarsal phalangeal joint left.  Capsulitis of the subtalar joint right foot. ?  ?PATIENT SURVEYS:  ?FOTO 52  with predicted goal of: 43 ?  ?COGNITION: ?          Overall cognitive status: Within functional limits for tasks assessed               ?           ?SENSATION: ?WFL ?Ankles/feet swollen ?  ?  ?POSTURE:  ?Knee hyper extension, bilateral valgus deformity knees ?  ?PALPATION: ?Minimally sensitive with deep pressure LB ?  ?LE ROM: ?  ?Active ROM Right ?12/26/2021 Left ?12/26/2021  ?Hip flexion 110 limited by pain wfl  ?Hip extension      ?Hip abduction      ?Hip adduction      ?Hip internal rotation      ?Hip external  rotation      ?Knee flexion      ?Knee extension      ?Ankle dorsiflexion      ?Ankle plantarflexion      ?Ankle inversion      ?Ankle eversion      ? (Blank rows = not tested) ?  ?LE MMT: ?  ?MMT Right ?12/26/2021 Left ?12/26/2021  ?Hip flexion 3+/5 limited to pain 4/5  ?Hip extension      ?Hip abduction 5 5  ?Hip adduction 5 5  ?       ?       ?Knee flexion 5 5  ?Knee extension 5 5  ? Ankle 5/5 ?  ?Lumbar spine ROM: flexion 10% limited to pain ?                                 Side bending R/L 75%  ?                                           Left rotation 25% limited to pain/Right 50% ?  ?FUNCTIONAL TESTS:  ?5 times sit to stand: 22 ?Timed up and go (TUG): 20 ?3 minute walk test O2 sats drop to 85% 372f ?  ?GAIT: ?Distance walked: 300 ?Assistive device utilized: None ?Level of assistance: Modified independence requires frequent rest periods ?Comments: cadence slowed, decreased arm swing, guarded ?  ?  ?  ?TODAY'S TREATMENT: ?Pt seen for aquatic therapy today.  Treatment took place in water 3.25-4 ft [facility] Temp of water was 93?.Marland Kitchen Pt entered/exited the pool via stairs step to independently with bilat rail.  ? ? ?Warm up:  Walking 4 widths each forward, back and side stepping, without UE support ? ?Standing UE supported by wall: ?Stretch for shoulder / LB  x 30 sec x 3  ?Heel / toe raises x 10  ?  hip extension x10 R/L ?  hip add/abd x 10 ?Using yellow hand buoys ?-shoulder horizontal abd/add x15 for shoulder stretch ? ?Pt requires buoyancy for support and to offload joints with strengthening exercises. Viscosity of the water is needed for resistance of strengthening; water current perturbations provides challenge to standing balance unsupported, requiring increased core activation. ? ?BP/pulse/O2 sats monitored (taken consecutively) ?Right ue: 180/105; 165/97 ?Left ue: 157/100 ? ?HR 60 ?O2 Sat 99% ? ?  ?  ?  PATIENT EDUCATION:  ?Education details: management of condition ?Person  educated: Patient ?Education method: Explanation ?Education comprehension: verbalized understanding ?  ?  ?HOME EXERCISE PROGRAM: ?[id number] from previous episode ?Pt instructed to walk daily x 10 minutes resting as needed. ?  ?ASSESSMENT: ?  ?CLINICAL IMPRESSION: ?Pt with complaints of chest tightness on and off for first 20 minutes of treatment. She is belching throughout. Decided to take BP and it is found to be slightly elevated.  Attempted to message PCP but not in system.  Attempted to call without an answer or ability to leave message.  Pt instructed to contact MD when returning home.  Pt edu/instruction on signs and symptoms of stroke (BE FAST) and heart attack (as per safety card on name tag).  She is instructed to call EMS in event she has any symptoms. ? ?  ?  ?OBJECTIVE IMPAIRMENTS cardiopulmonary status limiting activity, decreased activity tolerance, decreased endurance, decreased mobility, difficulty walking, and decreased strength.  ?  ?ACTIVITY LIMITATIONS cleaning, community activity, meal prep, shopping, and church.  ?  ?PERSONAL FACTORS Age, Fitness, and 3+ comorbidities: asthma, fibromyalgia, HTN  and gout are also affecting patient's functional outcome.  ?  ?  ?REHAB POTENTIAL: Good ?  ?CLINICAL DECISION MAKING: Evolving/moderate complexity ?  ?EVALUATION COMPLEXITY: Moderate ?  ?  ?GOALS: ?Goals reviewed with patient? Yes ?  ?SHORT TERM GOALS: Target date: 01/23/2022 ?  ?Pt will walk from parking lot to pool without rest period ?Baseline:  ?Goal status: INITIAL ?  ?2.  Pt to be indep and complaint with land based HEP ?Baseline: stopped completing ?Goal status: INITIAL ?  ?3.  Pt will decrease max LBP to <4/10 ?Baseline: 6/10 ?Goal status: Ongoing ?  ?4.  Pt will improve on 5x STS test to under 18s to demonstrate improved strength and decrease pain ?Baseline: 22s ?Goal status: INITIAL ?  ?  ?  ?LONG TERM GOALS: Target date: 02/20/2022 ?  ?Pt will amb distance from bus stop to pool with no more  than 2 rest periods ?Baseline: 5-6 ?Goal status: INITIAL ?  ?2.  Foto to meet stated goal ?Baseline: 52 ?Goal status: INITIAL ?  ?3.  Pt will improve on 3 minute walk test to >=650 ft ?Baseline: 358f ?Goal st

## 2022-01-24 NOTE — Addendum Note (Signed)
Addended by: Felicie Morn F on: 01/24/2022 01:36 PM ? ? Modules accepted: Orders ? ?

## 2022-01-25 ENCOUNTER — Ambulatory Visit (HOSPITAL_BASED_OUTPATIENT_CLINIC_OR_DEPARTMENT_OTHER): Payer: Medicare Other | Admitting: Physical Therapy

## 2022-01-25 ENCOUNTER — Encounter (HOSPITAL_BASED_OUTPATIENT_CLINIC_OR_DEPARTMENT_OTHER): Payer: Self-pay | Admitting: Physical Therapy

## 2022-01-25 DIAGNOSIS — R262 Difficulty in walking, not elsewhere classified: Secondary | ICD-10-CM

## 2022-01-25 DIAGNOSIS — M5459 Other low back pain: Secondary | ICD-10-CM

## 2022-01-25 DIAGNOSIS — M6281 Muscle weakness (generalized): Secondary | ICD-10-CM | POA: Diagnosis not present

## 2022-01-25 NOTE — Therapy (Signed)
?OUTPATIENT PHYSICAL THERAPY TREATMENT NOTE ? ? ?Patient Name: Dawn Lucas ?MRN: [id number] ?DOB:1956-03-26, 66 y.o., female ?Today's Date: 01/25/2022 ? ?PCP: Sonia Side., FNP ?REFERRING PROVIDER: Sonia Side., FNP ? ?END OF SESSION:  ? PT End of Session - 01/25/22 1313   ? ? Visit Number 5   ? Number of Visits 16   ? Date for PT Re-Evaluation 02/20/22   ? Authorization Type MCR/ medicaid   ? Progress Note Due on Visit 10   ? PT Start Time 1258   ? PT Stop Time 1340   ? PT Time Calculation (min) 42 min   ? Activity Tolerance Patient tolerated treatment well   ? Behavior During Therapy [facility] for tasks assessed/performed   ? ?  ?  ? ?  ? ? ?Past Medical History:  ?Diagnosis Date  ? Anemia   ? Arthritis   ? hands, knees  ? Asthma   ? Back pain   ? Fibromyalgia   ? Headache   ? Hyperlipidemia   ? Hypertension 11/04/2011  ? Poor dental hygiene   ? missing and chipped teeth  ? Sleep apnea   ? Uses CPAP nightly  ? SVD (spontaneous vaginal delivery)   ? x 1  ? UTI (lower urinary tract infection)   ? ?Past Surgical History:  ?Procedure Laterality Date  ? CHOLECYSTECTOMY    ? COLON SURGERY    ? DILATION AND CURETTAGE OF UTERUS    ? MAB x 4  ? HYSTEROSCOPY WITH D & C N/A 02/23/2018  ? Procedure: DILATATION AND CURETTAGE /HYSTEROSCOPY;  Surgeon: Mora Bellman, MD;  Location: [facility];  Service: Gynecology;  Laterality: N/A;  ? OVARIAN CYST REMOVAL    ? ?Patient Active Problem List  ? Diagnosis Date Noted  ? Family history of malignant neoplasm of gastrointestinal tract 12/20/2021  ? Personal history of colonic polyps 12/20/2021  ? Thickened endometrium   ? Prediabetes 04/28/2015  ? Pes planus of both feet 03/28/2015  ? Seborrheic keratoses 08/22/2014  ? Headache(784.0) 05/17/2014  ? Lower extremity edema 09/22/2013  ? Vitamin D deficiency 09/22/2013  ? Tendinitis of left rotator cuff 06/17/2013  ? Right ankle pain 06/17/2013  ? Colon cancer screening 06/17/2013  ? Knee pain, bilateral 03/20/2012  ? Hypertension  11/04/2011  ? Generalized pain 10/15/2011  ? Back pain 02/08/2011  ? Obese 02/08/2011  ? Tobacco abuse 02/08/2011  ? Sleep disorder 02/08/2011  ? ? ?REFERRING DIAG:  R53.81 (ICD-10-CM) - Debility ?  ? ?THERAPY DIAG:  ?Muscle weakness (generalized) ? ?Difficulty in walking, not elsewhere classified ? ?Discogenic lumbar pain ? ?PERTINENT HISTORY: Debility due to covid ?Fibromyalgia syndrome with widespread body pain ?Ankle edema ?Gout ?Asthma ?HTN ? ?PRECAUTIONS: none ? ?SUBJECTIVE: Pt reports she is no longer having chest pain.  She slept well last night and transportation got her here on time.   ? ?PAIN:  ?Are you having pain? Yes ?NPRS scale: 2/10  ?Pain location: LB ?Pain description: achey, occasionally sharp ?Aggravating factors: walking, standing ?Relieving factors: sitting, resting ?  ?PRECAUTIONS: None ?  ?WEIGHT BEARING RESTRICTIONS No ?  ?FALLS:  ?Has patient fallen in last 6 months? No, Number of falls: 0 ?  ?LIVING ENVIRONMENT: ?Lives with: lives alone ?Lives in: House/apartment ?Stairs: No;  ?Has following equipment at home: None ?  ?OCCUPATION: retired ?  ?PLOF: Independent with basic ADLs ?  ?PATIENT GOALS  walk better with less pain and less SOB ?  ?  ?OBJECTIVE:  ?  ?  DIAGNOSTIC FINDINGS: Hallux valgus deformity left foot.  Capsulitis with hammertoe deformity second metatarsal phalangeal joint left.  Capsulitis of the subtalar joint right foot. ?  ?PATIENT SURVEYS:  ?FOTO 52  with predicted goal of: 52 ?  ?COGNITION: ?          Overall cognitive status: Within functional limits for tasks assessed               ?           ?SENSATION: ?WFL ?Ankles/feet swollen ?  ?  ?POSTURE:  ?Knee hyper extension, bilateral valgus deformity knees ?  ?PALPATION: ?Minimally sensitive with deep pressure LB ?  ?LE ROM: ?  ?Active ROM Right ?12/26/2021 Left ?12/26/2021  ?Hip flexion 110 limited by pain wfl  ?Hip extension      ?Hip abduction      ?Hip adduction      ?Hip internal rotation      ?Hip external rotation       ?Knee flexion      ?Knee extension      ?Ankle dorsiflexion      ?Ankle plantarflexion      ?Ankle inversion      ?Ankle eversion      ? (Blank rows = not tested) ?  ?LE MMT: ?  ?MMT Right ?12/26/2021 Left ?12/26/2021  ?Hip flexion 3+/5 limited to pain 4/5  ?Hip extension      ?Hip abduction 5 5  ?Hip adduction 5 5  ?       ?       ?Knee flexion 5 5  ?Knee extension 5 5  ? Ankle 5/5 ?  ?Lumbar spine ROM: flexion 10% limited to pain ?                                 Side bending R/L 75%  ?                                           Left rotation 25% limited to pain/Right 50% ?  ?FUNCTIONAL TESTS:  ?5 times sit to stand: 22 ?Timed up and go (TUG): 20 ?3 minute walk test O2 sats drop to 85% 361f ?  ?GAIT: ?Distance walked: 300 ?Assistive device utilized: None ?Level of assistance: Modified independence requires frequent rest periods ?Comments: cadence slowed, decreased arm swing, guarded ?  ?  ?  ?TODAY'S TREATMENT: ?Pt seen for aquatic therapy today.  Treatment took place in water 3.25-4 ft [facility] Temp of water was 92?.  Pt entered/exited the pool via stairs step to independently with bilat rail.  ? ? ?Warm up:  Walking 4 laps each forward, back and side stepping, with UE support on aqua jogger barbell ? ?Standing UE supported by wall: ?Heel / toe raises x 15 ?  hip flex to/from extension x10 R/L ?  hip add/abd x 15 ?  Squats x 10 ? ?Using yellow hand buoys: -shoulder horizontal abd/add x10 for shoulder stretch;  row x 10, cues for scap retraction x 15 ?Forward/backward walking in between exercises for rest/recovery ?Attempted Warrior 1 lifting arched noodle off of pool x 1 rep LLE forward; unable tolerated RLE forward ?    Hip abdct/knee flexion x 10 each leg holding wall ?  ?Pt requires buoyancy for support  and to offload joints with strengthening exercises. Viscosity of the water is needed for resistance of strengthening; water current perturbations provides challenge to standing  balance unsupported, requiring increased core activation. ? ?  ?PATIENT EDUCATION:  ?Education details: management of condition ?Person educated: Patient ?Education method: Explanation ?Education comprehension: verbalized understanding ?  ?  ?HOME EXERCISE PROGRAM: ?[id number] from previous episode ?Pt instructed to walk daily x 10 minutes resting as needed. ?  ?ASSESSMENT: ?  ?CLINICAL IMPRESSION: ?Pt arrives in much more relaxed manor; transportation picked her up on time and she appears not as flustered as previous sessions. She reported slight increase in LBP with side stepping; minimal change with core engaged. She reported increased Rt post hip pain with Rt stance exercises and attempt at Rt warrior 1. Goals are ongoing.  ?  ?  ?OBJECTIVE IMPAIRMENTS cardiopulmonary status limiting activity, decreased activity tolerance, decreased endurance, decreased mobility, difficulty walking, and decreased strength.  ?  ?ACTIVITY LIMITATIONS cleaning, community activity, meal prep, shopping, and church.  ?  ?PERSONAL FACTORS Age, Fitness, and 3+ comorbidities: asthma, fibromyalgia, HTN  and gout are also affecting patient's functional outcome.  ?  ?  ?REHAB POTENTIAL: Good ?  ?CLINICAL DECISION MAKING: Evolving/moderate complexity ?  ?EVALUATION COMPLEXITY: Moderate ?  ?  ?GOALS: ?Goals reviewed with patient? Yes ?  ?SHORT TERM GOALS: Target date: 01/23/2022 ?  ?Pt will walk from parking lot to pool without rest period ?Baseline:  ?Goal status: INITIAL ?  ?2.  Pt to be indep and complaint with land based HEP ?Baseline: stopped completing ?Goal status: INITIAL ?  ?3.  Pt will decrease max LBP to <4/10 ?Baseline: 6/10 ?Goal status: Ongoing ?  ?4.  Pt will improve on 5x STS test to under 18s to demonstrate improved strength and decrease pain ?Baseline: 22s ?Goal status: INITIAL ?  ?  ?  ?LONG TERM GOALS: Target date: 02/20/2022 ?  ?Pt will amb distance from bus stop to pool with no more than 2 rest periods ?Baseline: 5-6 ?Goal  status: INITIAL ?  ?2.  Foto to meet stated goal ?Baseline: 52 ?Goal status: INITIAL ?  ?3.  Pt will improve on 3 minute walk test to >=650 ft ?Baseline: 338f ?Goal status: INITIAL ?  ?4.  Pt will improve on TU

## 2022-01-29 ENCOUNTER — Encounter (HOSPITAL_BASED_OUTPATIENT_CLINIC_OR_DEPARTMENT_OTHER): Payer: Self-pay | Admitting: Physical Therapy

## 2022-01-29 ENCOUNTER — Ambulatory Visit (HOSPITAL_BASED_OUTPATIENT_CLINIC_OR_DEPARTMENT_OTHER): Payer: Medicare Other | Admitting: Physical Therapy

## 2022-01-29 DIAGNOSIS — R262 Difficulty in walking, not elsewhere classified: Secondary | ICD-10-CM

## 2022-01-29 DIAGNOSIS — M6281 Muscle weakness (generalized): Secondary | ICD-10-CM | POA: Diagnosis not present

## 2022-01-29 DIAGNOSIS — M5459 Other low back pain: Secondary | ICD-10-CM

## 2022-01-29 NOTE — Therapy (Signed)
?OUTPATIENT PHYSICAL THERAPY TREATMENT NOTE ? ? ?Patient Name: Dawn Lucas ?MRN: [id number] ?DOB:14-Aug-1956, 66 y.o., female ?Today's Date: 01/29/2022 ? ?PCP: Sonia Side., FNP ?REFERRING PROVIDER: Sonia Side., FNP ? ?END OF SESSION:  ? PT End of Session - 01/25/22 1313   ? ? Visit Number 5   ? Number of Visits 16   ? Date for PT Re-Evaluation 02/20/22   ? Authorization Type MCR/ medicaid   ? Progress Note Due on Visit 10   ? PT Start Time 1258   ? PT Stop Time 1340   ? PT Time Calculation (min) 42 min   ? Activity Tolerance Patient tolerated treatment well   ? Behavior During Therapy [facility] for tasks assessed/performed   ? ?  ?  ? ?  ? ? ?Past Medical History:  ?Diagnosis Date  ? Anemia   ? Arthritis   ? hands, knees  ? Asthma   ? Back pain   ? Fibromyalgia   ? Headache   ? Hyperlipidemia   ? Hypertension 11/04/2011  ? Poor dental hygiene   ? missing and chipped teeth  ? Sleep apnea   ? Uses CPAP nightly  ? SVD (spontaneous vaginal delivery)   ? x 1  ? UTI (lower urinary tract infection)   ? ?Past Surgical History:  ?Procedure Laterality Date  ? CHOLECYSTECTOMY    ? COLON SURGERY    ? DILATION AND CURETTAGE OF UTERUS    ? MAB x 4  ? HYSTEROSCOPY WITH D & C N/A 02/23/2018  ? Procedure: DILATATION AND CURETTAGE /HYSTEROSCOPY;  Surgeon: Mora Bellman, MD;  Location: [facility];  Service: Gynecology;  Laterality: N/A;  ? OVARIAN CYST REMOVAL    ? ?Patient Active Problem List  ? Diagnosis Date Noted  ? Family history of malignant neoplasm of gastrointestinal tract 12/20/2021  ? Personal history of colonic polyps 12/20/2021  ? Thickened endometrium   ? Prediabetes 04/28/2015  ? Pes planus of both feet 03/28/2015  ? Seborrheic keratoses 08/22/2014  ? Headache(784.0) 05/17/2014  ? Lower extremity edema 09/22/2013  ? Vitamin D deficiency 09/22/2013  ? Tendinitis of left rotator cuff 06/17/2013  ? Right ankle pain 06/17/2013  ? Colon cancer screening 06/17/2013  ? Knee pain, bilateral 03/20/2012  ? Hypertension  11/04/2011  ? Generalized pain 10/15/2011  ? Back pain 02/08/2011  ? Obese 02/08/2011  ? Tobacco abuse 02/08/2011  ? Sleep disorder 02/08/2011  ? ? ?REFERRING DIAG:  R53.81 (ICD-10-CM) - Debility ?  ? ?THERAPY DIAG:  ?Muscle weakness (generalized) ? ?Difficulty in walking, not elsewhere classified ? ?Discogenic lumbar pain ? ?PERTINENT HISTORY: Debility due to covid ?Fibromyalgia syndrome with widespread body pain ?Ankle edema ?Gout ?Asthma ?HTN ? ?PRECAUTIONS: none ? ?SUBJECTIVE: Pt reports her back is hurting a little less today.  She voices disappointment of having to start over since having COVID. ?PAIN:  ?Are you having pain? Yes ?NPRS scale: 2/10  ?Pain location: LB ?Pain description: achey, occasionally sharp ?Aggravating factors: walking, standing ?Relieving factors: sitting, resting ?  ?PRECAUTIONS: None ?  ?WEIGHT BEARING RESTRICTIONS No ?  ?FALLS:  ?Has patient fallen in last 6 months? No, Number of falls: 0 ?  ?LIVING ENVIRONMENT: ?Lives with: lives alone ?Lives in: House/apartment ?Stairs: No;  ?Has following equipment at home: None ?  ?OCCUPATION: retired ?  ?PLOF: Independent with basic ADLs ?  ?PATIENT GOALS  walk better with less pain and less SOB ?  ?  ?OBJECTIVE:  ?  ?DIAGNOSTIC  FINDINGS: Hallux valgus deformity left foot.  Capsulitis with hammertoe deformity second metatarsal phalangeal joint left.  Capsulitis of the subtalar joint right foot. ?  ?PATIENT SURVEYS:  ?FOTO 52  with predicted goal of: 81 ?  ?COGNITION: ?          Overall cognitive status: Within functional limits for tasks assessed               ?           ?SENSATION: ?WFL ?Ankles/feet swollen ?  ?  ?POSTURE:  ?Knee hyper extension, bilateral valgus deformity knees ?  ?PALPATION: ?Minimally sensitive with deep pressure LB ?  ?LE ROM: ?  ?Active ROM Right ?12/26/2021 Left ?12/26/2021  ?Hip flexion 110 limited by pain wfl  ?Hip extension      ?Hip abduction      ?Hip adduction      ?Hip internal rotation      ?Hip external rotation       ?Knee flexion      ?Knee extension      ?Ankle dorsiflexion      ?Ankle plantarflexion      ?Ankle inversion      ?Ankle eversion      ? (Blank rows = not tested) ?  ?LE MMT: ?  ?MMT Right ?12/26/2021 Left ?12/26/2021  ?Hip flexion 3+/5 limited to pain 4/5  ?Hip extension      ?Hip abduction 5 5  ?Hip adduction 5 5  ?       ?       ?Knee flexion 5 5  ?Knee extension 5 5  ? Ankle 5/5 ?  ?Lumbar spine ROM: flexion 10% limited to pain ?                                 Side bending R/L 75%  ?                                           Left rotation 25% limited to pain/Right 50% ?  ?FUNCTIONAL TESTS:  ?5 times sit to stand: 22 ?Timed up and go (TUG): 20 ?3 minute walk test O2 sats drop to 85% 346f ?  ?GAIT: ?Distance walked: 300 ?Assistive device utilized: None ?Level of assistance: Modified independence requires frequent rest periods ?Comments: cadence slowed, decreased arm swing, guarded ?  ?  ?  ?TODAY'S TREATMENT: ?Pt seen for aquatic therapy today.  Treatment took place in water 3.25-4 ft [facility] Temp of water was 92?.  Pt entered/exited the pool via stairs step to independently with bilat rail.  ? ? ?Warm up:  Walking 4-5 laps each forward, back and side stepping, without UE support ?High knee marching in place with rainbow hand buoys under water ? ?Standing UE supported by wall: ?  hip flex to/from extension x10 R/L ?  hip add/abd x 12 ?  Heel raise to/from squat x 12 ?  Using yellow hand buoys: -shoulder horizontal abd/add x10 for shoulder stretch;  row x 10; SLS x 5-10 sec each leg, 2 sets ?Forward/backward walking in between exercises for rest/recovery ?Lt forward step ups / Rt forward step ups x 5 each  ?Seated on 4th step: flutter kick x 20;  sit to/from stand with use of UE on rails x  5 ?    ?  ?Pt requires buoyancy for support and to offload joints with strengthening exercises. Viscosity of the water is needed for resistance of strengthening; water current perturbations  provides challenge to standing balance unsupported, requiring increased core activation. ? ?  ?PATIENT EDUCATION:  ?Education details: management of condition ?Person educated: Patient ?Education method: Explanation ?Education comprehension: verbalized understanding ?  ?  ?HOME EXERCISE PROGRAM: ?[id number] from previous episode ?Pt instructed to walk daily x 10 minutes resting as needed. ?  ?ASSESSMENT: ?  ?CLINICAL IMPRESSION: ?SLS was challenging today, but improved with 2nd rep.  She did not have any pain with side stepping as she did last session.  Leaning back on stairs for flutter kick irritated back; reduced with change in exercise. Overall her exercise tolerance is improving each visit; She was able to tolerate increased number of exercises. She reports noticeable decrease in LLE strength compared to RLE; more difficulty ascending step with L.  Goals are ongoing.  ?  ?  ?OBJECTIVE IMPAIRMENTS cardiopulmonary status limiting activity, decreased activity tolerance, decreased endurance, decreased mobility, difficulty walking, and decreased strength.  ?  ?ACTIVITY LIMITATIONS cleaning, community activity, meal prep, shopping, and church.  ?  ?PERSONAL FACTORS Age, Fitness, and 3+ comorbidities: asthma, fibromyalgia, HTN  and gout are also affecting patient's functional outcome.  ?  ?  ?REHAB POTENTIAL: Good ?  ?CLINICAL DECISION MAKING: Evolving/moderate complexity ?  ?EVALUATION COMPLEXITY: Moderate ?  ?  ?GOALS: ?Goals reviewed with patient? Yes ?  ?SHORT TERM GOALS: Target date: 01/23/2022 ?  ?Pt will walk from parking lot to pool without rest period ?Baseline:  ?Goal status: INITIAL ?  ?2.  Pt to be indep and complaint with land based HEP ?Baseline: stopped completing ?Goal status: INITIAL ?  ?3.  Pt will decrease max LBP to <4/10 ?Baseline: 6/10 ?Goal status: Ongoing ?  ?4.  Pt will improve on 5x STS test to under 18s to demonstrate improved strength and decrease pain ?Baseline: 22s ?Goal status: INITIAL ?  ?   ?  ?LONG TERM GOALS: Target date: 02/20/2022 ?  ?Pt will amb distance from bus stop to pool with no more than 2 rest periods ?Baseline: 5-6 ?Goal status: INITIAL ?  ?2.  Foto to meet stated goal ?Baseline: 52

## 2022-01-31 ENCOUNTER — Ambulatory Visit: Payer: Medicare Other | Admitting: Podiatry

## 2022-02-01 ENCOUNTER — Encounter (HOSPITAL_BASED_OUTPATIENT_CLINIC_OR_DEPARTMENT_OTHER): Payer: Self-pay | Admitting: Physical Therapy

## 2022-02-01 ENCOUNTER — Ambulatory Visit (HOSPITAL_BASED_OUTPATIENT_CLINIC_OR_DEPARTMENT_OTHER): Payer: Medicare Other | Admitting: Physical Therapy

## 2022-02-01 DIAGNOSIS — R262 Difficulty in walking, not elsewhere classified: Secondary | ICD-10-CM

## 2022-02-01 DIAGNOSIS — M6281 Muscle weakness (generalized): Secondary | ICD-10-CM | POA: Diagnosis not present

## 2022-02-01 DIAGNOSIS — M5459 Other low back pain: Secondary | ICD-10-CM

## 2022-02-01 NOTE — Therapy (Signed)
?OUTPATIENT PHYSICAL THERAPY TREATMENT NOTE ? ? ?Patient Name: Dawn Lucas ?MRN: [id number] ?DOB:27-Mar-1956, 66 y.o., female ?Today's Date: 02/01/2022 ? ?PCP: Sonia Side., FNP ?REFERRING PROVIDER: Sonia Side., FNP ? ?END OF SESSION:  ? PT End of Session - 01/25/22 1313   ? ? Visit Number 5   ? Number of Visits 16   ? Date for PT Re-Evaluation 02/20/22   ? Authorization Type MCR/ medicaid   ? Progress Note Due on Visit 10   ? PT Start Time 1258   ? PT Stop Time 1340   ? PT Time Calculation (min) 42 min   ? Activity Tolerance Patient tolerated treatment well   ? Behavior During Therapy [facility] for tasks assessed/performed   ? ?  ?  ? ?  ? ? ?Past Medical History:  ?Diagnosis Date  ? Anemia   ? Arthritis   ? hands, knees  ? Asthma   ? Back pain   ? Fibromyalgia   ? Headache   ? Hyperlipidemia   ? Hypertension 11/04/2011  ? Poor dental hygiene   ? missing and chipped teeth  ? Sleep apnea   ? Uses CPAP nightly  ? SVD (spontaneous vaginal delivery)   ? x 1  ? UTI (lower urinary tract infection)   ? ?Past Surgical History:  ?Procedure Laterality Date  ? CHOLECYSTECTOMY    ? COLON SURGERY    ? DILATION AND CURETTAGE OF UTERUS    ? MAB x 4  ? HYSTEROSCOPY WITH D & C N/A 02/23/2018  ? Procedure: DILATATION AND CURETTAGE /HYSTEROSCOPY;  Surgeon: Mora Bellman, MD;  Location: [facility];  Service: Gynecology;  Laterality: N/A;  ? OVARIAN CYST REMOVAL    ? ?Patient Active Problem List  ? Diagnosis Date Noted  ? Family history of malignant neoplasm of gastrointestinal tract 12/20/2021  ? Personal history of colonic polyps 12/20/2021  ? Thickened endometrium   ? Prediabetes 04/28/2015  ? Pes planus of both feet 03/28/2015  ? Seborrheic keratoses 08/22/2014  ? Headache(784.0) 05/17/2014  ? Lower extremity edema 09/22/2013  ? Vitamin D deficiency 09/22/2013  ? Tendinitis of left rotator cuff 06/17/2013  ? Right ankle pain 06/17/2013  ? Colon cancer screening 06/17/2013  ? Knee pain, bilateral 03/20/2012  ? Hypertension  11/04/2011  ? Generalized pain 10/15/2011  ? Back pain 02/08/2011  ? Obese 02/08/2011  ? Tobacco abuse 02/08/2011  ? Sleep disorder 02/08/2011  ? ? ?REFERRING DIAG:  R53.81 (ICD-10-CM) - Debility ?  ? ?THERAPY DIAG:  ?Muscle weakness (generalized) ? ?Difficulty in walking, not elsewhere classified ? ?Discogenic lumbar pain ? ?PERTINENT HISTORY: Debility due to covid ?Fibromyalgia syndrome with widespread body pain ?Ankle edema ?Gout ?Asthma ?HTN ? ?PRECAUTIONS: none ? ?SUBJECTIVE: Pt reports her back is not too bad today, "Pain is getting less".   ?PAIN:  ?Are you having pain? Yes ?NPRS scale: 1?/10  ?Pain location: LB ?Pain description: achey, occasionally sharp ?Aggravating factors: walking, standing ?Relieving factors: sitting, resting ?  ?PRECAUTIONS: None ?  ?WEIGHT BEARING RESTRICTIONS No ?  ?FALLS:  ?Has patient fallen in last 6 months? No, Number of falls: 0 ?  ?LIVING ENVIRONMENT: ?Lives with: lives alone ?Lives in: House/apartment ?Stairs: No;  ?Has following equipment at home: None ?  ?OCCUPATION: retired ?  ?PLOF: Independent with basic ADLs ?  ?PATIENT GOALS  walk better with less pain and less SOB ?  ?  ?OBJECTIVE:  ?  ?DIAGNOSTIC FINDINGS: Hallux valgus deformity left foot.  Capsulitis with hammertoe deformity second metatarsal phalangeal joint left.  Capsulitis of the subtalar joint right foot. ?  ?PATIENT SURVEYS:  ?FOTO 52  with predicted goal of: 54 ?  ?COGNITION: ?          Overall cognitive status: Within functional limits for tasks assessed               ?           ?SENSATION: ?WFL ?Ankles/feet swollen ?  ?  ?POSTURE:  ?Knee hyper extension, bilateral valgus deformity knees ?  ?PALPATION: ?Minimally sensitive with deep pressure LB ?  ?LE ROM: ?  ?Active ROM Right ?12/26/2021 Left ?12/26/2021  ?Hip flexion 110 limited by pain wfl  ?Hip extension      ?Hip abduction      ?Hip adduction      ?Hip internal rotation      ?Hip external rotation      ?Knee flexion      ?Knee extension      ?Ankle  dorsiflexion      ?Ankle plantarflexion      ?Ankle inversion      ?Ankle eversion      ? (Blank rows = not tested) ?  ?LE MMT: ?  ?MMT Right ?12/26/2021 Left ?12/26/2021  ?Hip flexion 3+/5 limited to pain 4/5  ?Hip extension      ?Hip abduction 5 5  ?Hip adduction 5 5  ?       ?       ?Knee flexion 5 5  ?Knee extension 5 5  ? Ankle 5/5 ?  ?Lumbar spine ROM: flexion 10% limited to pain ?                                 Side bending R/L 75%  ?                                           Left rotation 25% limited to pain/Right 50% ?  ?FUNCTIONAL TESTS:  ?5 times sit to stand: 22 ?Timed up and go (TUG): 20 ?3 minute walk test O2 sats drop to 85% 370f ?  ?GAIT: ?Distance walked: 300 ?Assistive device utilized: None ?Level of assistance: Modified independence requires frequent rest periods ?Comments: cadence slowed, decreased arm swing, guarded ?  ?  ?  ?TODAY'S TREATMENT: ?Pt seen for aquatic therapy today.  Treatment took place in [facility] 3.25-[facility] Temp of water was 92?.  Pt entered/exited the pool via stairs step to independently with bilat rail.  ? ? ?Warm up without UE support:  Walking 4-5 laps each forward and back. Side stepping x 3 laps ?Standing UE supported by wall: ? hip flex to/from extension x10 R/L; heel raises x 10 x 2;  hip add/abd x 10 ?High knee marching in place with rainbow hand buoys under waterUsing yellow hand buoys ?Kickboard push down 5 sec x 10 (board in vertical position)  ?Lt forward step ups / Rt forward step ups x 10 each LE with unilateral support ?Forward/backward walking in between exercises for rest/recovery ?Seated on 4th step: flutter kick x 10x2;  sit to/from stand without use of UE x 4 ?    ?  ?Pt requires buoyancy for support and to offload joints with strengthening  exercises. Viscosity of the water is needed for resistance of strengthening; water current perturbations provides challenge to standing balance unsupported, requiring increased core  activation. ? ?  ?PATIENT EDUCATION:  ?Education details: management of condition ?Person educated: Patient ?Education method: Explanation ?Education comprehension: verbalized understanding ?  ?  ?HOME EXERCISE PROGRAM: ?[id number] from previous episode ?Pt instructed to walk daily x 10 minutes resting as needed. ?  ?ASSESSMENT: ?  ?CLINICAL IMPRESSION: ?Pt reported increased LBP again with side stepping; pain reduces with change in exercise.  Overall her exercise tolerance is improving each visit. Pt was able to complete 4 reps of sit to/from stand on low surface (stairs) without use of arms.  Improved tolerance for step ups. Progressing well towards goals.   ?  ?  ?OBJECTIVE IMPAIRMENTS cardiopulmonary status limiting activity, decreased activity tolerance, decreased endurance, decreased mobility, difficulty walking, and decreased strength.  ?  ?ACTIVITY LIMITATIONS cleaning, community activity, meal prep, shopping, and church.  ?  ?PERSONAL FACTORS Age, Fitness, and 3+ comorbidities: asthma, fibromyalgia, HTN  and gout are also affecting patient's functional outcome.  ?  ?  ?REHAB POTENTIAL: Good ?  ?CLINICAL DECISION MAKING: Evolving/moderate complexity ?  ?EVALUATION COMPLEXITY: Moderate ?  ?  ?GOALS: ?Goals reviewed with patient? Yes ?  ?SHORT TERM GOALS: Target date: 01/23/2022 ?  ?Pt will walk from parking lot to pool without rest period ?Baseline:  ?Goal status: Ongoing  ?  ?2.  Pt to be indep and complaint with land based HEP ?Baseline: stopped completing ?Goal status: ongoing ?  ?3.  Pt will decrease max LBP to <4/10 ?Baseline: 6/10 ?Goal status: Ongoing ?  ?4.  Pt will improve on 5x STS test to under 18s to demonstrate improved strength and decrease pain ?Baseline: 22s ?Goal status: Ongoing ?  ?  ?  ?LONG TERM GOALS: Target date: 02/20/2022 ?  ?Pt will amb distance from bus stop to pool with no more than 2 rest periods ?Baseline: 5-6 ?Goal status: INITIAL ?  ?2.  Foto to meet stated goal ?Baseline: 52 ?Goal  status: INITIAL ?  ?3.  Pt will improve on 3 minute walk test to >=650 ft ?Baseline: 376f ?Goal status: INITIAL ?  ?4.  Pt will improve on TUG test to <=12s to demonstrate improved mobility and balance ?Timed u

## 2022-02-05 ENCOUNTER — Encounter (HOSPITAL_BASED_OUTPATIENT_CLINIC_OR_DEPARTMENT_OTHER): Payer: Self-pay | Admitting: Physical Therapy

## 2022-02-05 ENCOUNTER — Ambulatory Visit (HOSPITAL_BASED_OUTPATIENT_CLINIC_OR_DEPARTMENT_OTHER): Payer: Medicare Other | Attending: Physical Medicine & Rehabilitation | Admitting: Physical Therapy

## 2022-02-05 DIAGNOSIS — M5459 Other low back pain: Secondary | ICD-10-CM | POA: Diagnosis present

## 2022-02-05 DIAGNOSIS — M6281 Muscle weakness (generalized): Secondary | ICD-10-CM | POA: Diagnosis present

## 2022-02-05 DIAGNOSIS — R262 Difficulty in walking, not elsewhere classified: Secondary | ICD-10-CM | POA: Insufficient documentation

## 2022-02-05 NOTE — Therapy (Signed)
?OUTPATIENT PHYSICAL THERAPY TREATMENT NOTE ? ? ?Patient Name: Dawn Lucas ?MRN: [id number] ?DOB:04-07-1956, 66 y.o., female ?Today's Date: 02/05/2022 ? ?PCP: Sonia Side., FNP ?REFERRING PROVIDER: Sonia Side., FNP ? ?END OF SESSION:  ? PT End of Session - 01/25/22 1313   ? ? Visit Number 8  ? Number of Visits 16   ? Date for PT Re-Evaluation 02/20/22   ? Authorization Type MCR/ medicaid   ? Progress Note Due on Visit 10   ? PT Start Time 1884 - pt arrived late due to transportation  ? PT Stop Time 1660  ? PT Time Calculation (min) 30 min   ? Activity Tolerance Patient tolerated treatment well   ? Behavior During Therapy [facility] for tasks assessed/performed   ? ?  ?  ? ?  ? ? ?Past Medical History:  ?Diagnosis Date  ? Anemia   ? Arthritis   ? hands, knees  ? Asthma   ? Back pain   ? Fibromyalgia   ? Headache   ? Hyperlipidemia   ? Hypertension 11/04/2011  ? Poor dental hygiene   ? missing and chipped teeth  ? Sleep apnea   ? Uses CPAP nightly  ? SVD (spontaneous vaginal delivery)   ? x 1  ? UTI (lower urinary tract infection)   ? ?Past Surgical History:  ?Procedure Laterality Date  ? CHOLECYSTECTOMY    ? COLON SURGERY    ? DILATION AND CURETTAGE OF UTERUS    ? MAB x 4  ? HYSTEROSCOPY WITH D & C N/A 02/23/2018  ? Procedure: DILATATION AND CURETTAGE /HYSTEROSCOPY;  Surgeon: Mora Bellman, MD;  Location: [facility];  Service: Gynecology;  Laterality: N/A;  ? OVARIAN CYST REMOVAL    ? ?Patient Active Problem List  ? Diagnosis Date Noted  ? Family history of malignant neoplasm of gastrointestinal tract 12/20/2021  ? Personal history of colonic polyps 12/20/2021  ? Thickened endometrium   ? Prediabetes 04/28/2015  ? Pes planus of both feet 03/28/2015  ? Seborrheic keratoses 08/22/2014  ? Headache(784.0) 05/17/2014  ? Lower extremity edema 09/22/2013  ? Vitamin D deficiency 09/22/2013  ? Tendinitis of left rotator cuff 06/17/2013  ? Right ankle pain 06/17/2013  ? Colon cancer screening 06/17/2013  ? Knee pain,  bilateral 03/20/2012  ? Hypertension 11/04/2011  ? Generalized pain 10/15/2011  ? Back pain 02/08/2011  ? Obese 02/08/2011  ? Tobacco abuse 02/08/2011  ? Sleep disorder 02/08/2011  ? ? ?REFERRING DIAG:  R53.81 (ICD-10-CM) - Debility ?  ? ?THERAPY DIAG:  ?Muscle weakness (generalized) ? ?Difficulty in walking, not elsewhere classified ? ?Discogenic lumbar pain ? ?PERTINENT HISTORY: Debility due to covid ?Fibromyalgia syndrome with widespread body pain ?Ankle edema ?Gout ?Asthma ?HTN ? ?PRECAUTIONS: none ? ?SUBJECTIVE: Pt reports she slept a lot this weekend, so her back isn't feeling much back pain.  ?PAIN:  ?Are you having pain? Yes ?NPRS scale: 1/10  ?Pain location: LB ?Pain description: achey, occasionally sharp ?Aggravating factors: walking, standing ?Relieving factors: sitting, resting ?  ?PRECAUTIONS: None ?  ?WEIGHT BEARING RESTRICTIONS No ?  ?FALLS:  ?Has patient fallen in last 6 months? No, Number of falls: 0 ?  ?LIVING ENVIRONMENT: ?Lives with: lives alone ?Lives in: House/apartment ?Stairs: No;  ?Has following equipment at home: None ?  ?OCCUPATION: retired ?  ?PLOF: Independent with basic ADLs ?  ?PATIENT GOALS  walk better with less pain and less SOB ?  ?  ?OBJECTIVE: * Findings taken at [facility]  unless otherwise noted.  ?  ?DIAGNOSTIC FINDINGS: Hallux valgus deformity left foot.  Capsulitis with hammertoe deformity second metatarsal phalangeal joint left.  Capsulitis of the subtalar joint right foot. ?  ?PATIENT SURVEYS:  ?FOTO 52  with predicted goal of: 40 ?  ?COGNITION: ?          Overall cognitive status: Within functional limits for tasks assessed               ?           ?SENSATION: ?WFL ?Ankles/feet swollen ?  ?  ?POSTURE:  ?Knee hyper extension, bilateral valgus deformity knees ?  ?PALPATION: ?Minimally sensitive with deep pressure LB ?  ?LE ROM: ?  ?Active ROM Right ?12/26/2021 Left ?12/26/2021  ?Hip flexion 110 limited by pain wfl  ?Hip extension      ?Hip abduction      ?Hip adduction      ?Hip  internal rotation      ?Hip external rotation      ?Knee flexion      ?Knee extension      ?Ankle dorsiflexion      ?Ankle plantarflexion      ?Ankle inversion      ?Ankle eversion      ? (Blank rows = not tested) ?  ?LE MMT: ?  ?MMT Right ?12/26/2021 Left ?12/26/2021  ?Hip flexion 3+/5 limited to pain 4/5  ?Hip extension      ?Hip abduction 5 5  ?Hip adduction 5 5  ?       ?       ?Knee flexion 5 5  ?Knee extension 5 5  ? Ankle 5/5 ?  ?Lumbar spine ROM: flexion 10% limited to pain ?                                 Side bending R/L 75%  ?                                           Left rotation 25% limited to pain/Right 50% ?  ?FUNCTIONAL TESTS:  ?5 times sit to stand: 22 ?Timed up and go (TUG): 20 ?3 minute walk test O2 sats drop to 85% 370f ?  ?GAIT: ?Distance walked: 300 ?Assistive device utilized: None ?Level of assistance: Modified independence requires frequent rest periods ?Comments: cadence slowed, decreased arm swing, guarded ?  ?  ?  ?TODAY'S TREATMENT: ?Pt seen for aquatic therapy today.  Treatment took place in water 3.25-4 ft [facility] Temp of water was 92?.  Pt entered/exited the pool via stairs step to independently with bilat rail.  ? ? ?Warm up without UE support:  Walking 3 laps each forward and back. Side stepping x 3 laps ?Blue noodle push down with high knee marching to noodle  ?Seated on bench in water (blue step under feet): STS x 10 ?Lt forward step ups / Rt forward step ups x 10 each LE with unilateral support ?Light prancing forward 1 lap ?Quick backwards walking  x 2 laps  ? ?Standing UE supported by wall: ? hip flex to/from extension x10 R/L; heel raises x 20;  hip add/abd x 10    ?  ?Pt requires buoyancy for support and to offload joints with strengthening exercises. Viscosity of  the water is needed for resistance of strengthening; water current perturbations provides challenge to standing balance unsupported, requiring increased core activation. ? ?   ?PATIENT EDUCATION:  ?Education details: management of condition ?Person educated: Patient ?Education method: Explanation ?Education comprehension: verbalized understanding ?  ?  ?HOME EXERCISE PROGRAM: ?[id number] from previous episode ?Pt instructed to walk daily x 10 minutes resting as needed. ?  ?ASSESSMENT: ?  ?CLINICAL IMPRESSION: ?Pt's session shortened due to late arrival (pt utilizes transportation services).  She tolerated exercises except heel/toe raises - she reports that they increased her ankle pain. She was able to increase speed (=increased resistance) without difficulty.   Overall her exercise tolerance is improving each visit.   Improved tolerance for step ups. Progressing well towards goals.   ?  ?  ?OBJECTIVE IMPAIRMENTS cardiopulmonary status limiting activity, decreased activity tolerance, decreased endurance, decreased mobility, difficulty walking, and decreased strength.  ?  ?ACTIVITY LIMITATIONS cleaning, community activity, meal prep, shopping, and church.  ?  ?PERSONAL FACTORS Age, Fitness, and 3+ comorbidities: asthma, fibromyalgia, HTN  and gout are also affecting patient's functional outcome.  ?  ?  ?REHAB POTENTIAL: Good ?  ?CLINICAL DECISION MAKING: Evolving/moderate complexity ?  ?EVALUATION COMPLEXITY: Moderate ?  ?  ?GOALS: ?Goals reviewed with patient? Yes ?  ?SHORT TERM GOALS: Target date: 01/23/2022 ?  ?Pt will walk from parking lot to pool without rest period ?Baseline:  ?Goal status:  Achieved ?  ?2.  Pt to be indep and complaint with land based HEP ?Baseline: stopped completing ?Goal status: Partially met; is only walking (for land HEP) ?  ?3.  Pt will decrease max LBP to <4/10 ?Baseline: 5/10 - 02/05/22 ?Goal status: Ongoing ?  ?4.  Pt will improve on 5x STS test to under 18s to demonstrate improved strength and decrease pain ?Baseline: 22s ?Goal status: Ongoing ?  ?  ?  ?LONG TERM GOALS: Target date: 02/20/2022 ?  ?Pt will amb distance from bus stop to pool with no more than 2  rest periods ?Baseline: 5-6 ?Goal status: INITIAL ?  ?2.  Foto to meet stated goal ?Baseline: 52 ?Goal status: INITIAL ?  ?3.  Pt will improve on 3 minute walk test to >=650 ft ?Baseline: 315f ?Goal status: INITIAL ?

## 2022-02-08 ENCOUNTER — Encounter (HOSPITAL_BASED_OUTPATIENT_CLINIC_OR_DEPARTMENT_OTHER): Payer: Self-pay | Admitting: Physical Therapy

## 2022-02-08 ENCOUNTER — Ambulatory Visit (HOSPITAL_BASED_OUTPATIENT_CLINIC_OR_DEPARTMENT_OTHER): Payer: Medicare Other | Admitting: Physical Therapy

## 2022-02-08 DIAGNOSIS — M6281 Muscle weakness (generalized): Secondary | ICD-10-CM | POA: Diagnosis not present

## 2022-02-08 DIAGNOSIS — R262 Difficulty in walking, not elsewhere classified: Secondary | ICD-10-CM

## 2022-02-08 DIAGNOSIS — M5459 Other low back pain: Secondary | ICD-10-CM

## 2022-02-08 NOTE — Therapy (Signed)
?OUTPATIENT PHYSICAL THERAPY TREATMENT NOTE ? ? ?Patient Name: Dawn Lucas ?MRN: [id number] ?DOB:March 26, 1956, 66 y.o., female ?Today's Date: 02/08/2022 ? ?PCP: Sonia Side., FNP ?REFERRING PROVIDER: Sonia Side., FNP ? ?END OF SESSION:  ? PT End of Session - 02/08/2022   ? ? Visit Number 9  ? Number of Visits 16   ? Date for PT Re-Evaluation 02/20/22   ? Authorization Type MCR/ medicaid   ? Progress Note Due on Visit 10   ? PT Start Time 1034  ? PT Stop Time 1117  ? PT Time Calculation (min) 43 min   ? Activity Tolerance Patient tolerated treatment well   ? Behavior During Therapy [facility] for tasks assessed/performed   ? ?  ?  ? ?  ? ? ?Past Medical History:  ?Diagnosis Date  ? Anemia   ? Arthritis   ? hands, knees  ? Asthma   ? Back pain   ? Fibromyalgia   ? Headache   ? Hyperlipidemia   ? Hypertension 11/04/2011  ? Poor dental hygiene   ? missing and chipped teeth  ? Sleep apnea   ? Uses CPAP nightly  ? SVD (spontaneous vaginal delivery)   ? x 1  ? UTI (lower urinary tract infection)   ? ?Past Surgical History:  ?Procedure Laterality Date  ? CHOLECYSTECTOMY    ? COLON SURGERY    ? DILATION AND CURETTAGE OF UTERUS    ? MAB x 4  ? HYSTEROSCOPY WITH D & C N/A 02/23/2018  ? Procedure: DILATATION AND CURETTAGE /HYSTEROSCOPY;  Surgeon: Mora Bellman, MD;  Location: [facility];  Service: Gynecology;  Laterality: N/A;  ? OVARIAN CYST REMOVAL    ? ?Patient Active Problem List  ? Diagnosis Date Noted  ? Family history of malignant neoplasm of gastrointestinal tract 12/20/2021  ? Personal history of colonic polyps 12/20/2021  ? Thickened endometrium   ? Prediabetes 04/28/2015  ? Pes planus of both feet 03/28/2015  ? Seborrheic keratoses 08/22/2014  ? Headache(784.0) 05/17/2014  ? Lower extremity edema 09/22/2013  ? Vitamin D deficiency 09/22/2013  ? Tendinitis of left rotator cuff 06/17/2013  ? Right ankle pain 06/17/2013  ? Colon cancer screening 06/17/2013  ? Knee pain, bilateral 03/20/2012  ? Hypertension 11/04/2011  ?  Generalized pain 10/15/2011  ? Back pain 02/08/2011  ? Obese 02/08/2011  ? Tobacco abuse 02/08/2011  ? Sleep disorder 02/08/2011  ? ? ?REFERRING DIAG:  R53.81 (ICD-10-CM) - Debility ?  ? ?THERAPY DIAG:  ?Muscle weakness (generalized) ? ?Difficulty in walking, not elsewhere classified ? ?Discogenic lumbar pain ? ?PERTINENT HISTORY: Debility due to covid ?Fibromyalgia syndrome with widespread body pain ?Ankle edema ?Gout ?Asthma ?HTN ? ?PRECAUTIONS: none ? ?SUBJECTIVE: Pt reports she has been busy, so she is very tired.  Her back is feeling better.  She plans to see her primary Dr today; she's concerned about persistent ankle swelling.  "When I'm up walking, my ankles swell."  When asked if she wears her compression socks when walking, she states, "no, they hurt".   ? ?PAIN:  ?Are you having pain? No ?NPRS scale: 0/10  ?Pain location: LB ?Pain description: achey, occasionally sharp ?Aggravating factors: walking, standing ?Relieving factors: sitting, resting ?  ?PRECAUTIONS: None ?  ?WEIGHT BEARING RESTRICTIONS No ?  ?FALLS:  ?Has patient fallen in last 6 months? No, Number of falls: 0 ?  ?LIVING ENVIRONMENT: ?Lives with: lives alone ?Lives in: House/apartment ?Stairs: No;  ?Has following equipment at home: None ?  ?  OCCUPATION: retired ?  ?PLOF: Independent with basic ADLs ?  ?PATIENT GOALS  walk better with less pain and less SOB ?  ?  ?OBJECTIVE: * Findings taken at [facility] unless otherwise noted.  ?  ?DIAGNOSTIC FINDINGS: Hallux valgus deformity left foot.  Capsulitis with hammertoe deformity second metatarsal phalangeal joint left.  Capsulitis of the subtalar joint right foot. ?  ?PATIENT SURVEYS:  ?FOTO 52  with predicted goal of: 69 ?  ?COGNITION: ?          Overall cognitive status: Within functional limits for tasks assessed               ?           ?SENSATION: ?WFL ?Ankles/feet swollen ?  ?  ?POSTURE:  ?Knee hyper extension, bilateral valgus deformity knees ?  ?PALPATION: ?Minimally sensitive with deep pressure  LB ?  ?LE ROM: ?  ?Active ROM Right ?12/26/2021 Left ?12/26/2021  ?Hip flexion 110 limited by pain wfl  ?Hip extension      ?Hip abduction      ?Hip adduction      ?Hip internal rotation      ?Hip external rotation      ?Knee flexion      ?Knee extension      ?Ankle dorsiflexion      ?Ankle plantarflexion      ?Ankle inversion      ?Ankle eversion      ? (Blank rows = not tested) ?  ?LE MMT: ?  ?MMT Right ?12/26/2021 Left ?12/26/2021  ?Hip flexion 3+/5 limited to pain 4/5  ?Hip extension      ?Hip abduction 5 5  ?Hip adduction 5 5  ?       ?       ?Knee flexion 5 5  ?Knee extension 5 5  ? Ankle 5/5 ?  ?Lumbar spine ROM: flexion 10% limited to pain ?                                 Side bending R/L 75%  ?                                           Left rotation 25% limited to pain/Right 50% ?  ?FUNCTIONAL TESTS:  ?5 times sit to stand: 22 ?Timed up and go (TUG): 20 ?3 minute walk test O2 sats drop to 85% 350f ?  ?GAIT: ?Distance walked: 300 ?Assistive device utilized: None ?Level of assistance: Modified independence requires frequent rest periods ?Comments: cadence slowed, decreased arm swing, guarded ?  ?  ?  ?TODAY'S TREATMENT: ?Pt seen for aquatic therapy today.  Treatment took place in water 3.25-4 ft [facility] Temp of water was 92?.  Pt entered/exited the pool via stairs step to independently with bilat rail.  ? ? ?Warm up without UE support:  Walking 3 laps each forward and back. Side stepping x 3 laps ?Walking forward with rainbow hand buoys under water ?Holding wall:  squats x 10; hip flex to/from extension x10 R/L; hip openers x 10; hip /knee flex crossing midline x 10 ?Holding yellow noodle:  Heel raises x 10;  side step into squat followed by gentle press down for ab set -1 lap;   ?Without support: Quick backwards walking  x 2 laps  ?Seated on bench in water (blue step under feet): STS x 10 ?Lt forward step ups / Rt forward step ups x 10 each LE with intermittent unilateral  support ?Trial of lumbar rotation with yellow kickboard, 1 rep - painful with R rotation  ?Kickboard pushdowns x 5 (board flat on water) ? ? ?Pt requires buoyancy for support and to offload joints with strengthening exercises. Viscosity of the water is needed for resistance of strengthening; water current perturbations provides challenge to standing balance unsupported, requiring increased core activation. ? ?  ?PATIENT EDUCATION:  ?Education details: management of condition ?Person educated: Patient ?Education method: Explanation ?Education comprehension: verbalized understanding ?  ?  ?HOME EXERCISE PROGRAM: ?[id number] from previous episode ?Pt instructed to walk daily x 10 minutes resting as needed. ?  ?ASSESSMENT: ?  ?CLINICAL IMPRESSION: ?Pt continues to have limited tolerance for Heel raises due to increased ankle pain. She also did not tolerate trunk rotation R with kickboard; will avoid this in future.  Otherwise, exercises tolerated well, without production of LBP.  Overall her exercise tolerance is improving each visit.   Improved tolerance for step ups; completing with less UE support. Progressing well towards goals.   ?  ?  ?OBJECTIVE IMPAIRMENTS cardiopulmonary status limiting activity, decreased activity tolerance, decreased endurance, decreased mobility, difficulty walking, and decreased strength.  ?  ?ACTIVITY LIMITATIONS cleaning, community activity, meal prep, shopping, and church.  ?  ?PERSONAL FACTORS Age, Fitness, and 3+ comorbidities: asthma, fibromyalgia, HTN  and gout are also affecting patient's functional outcome.  ?  ?  ?REHAB POTENTIAL: Good ?  ?CLINICAL DECISION MAKING: Evolving/moderate complexity ?  ?EVALUATION COMPLEXITY: Moderate ?  ?  ?GOALS: ?Goals reviewed with patient? Yes ?  ?SHORT TERM GOALS: Target date: 01/23/2022 ?  ?Pt will walk from parking lot to pool without rest period ?Baseline:  ?Goal status:  Achieved ?  ?2.  Pt to be indep and complaint with land based HEP ?Baseline:  stopped completing ?Goal status: Partially met; is only walking (for land HEP) ?  ?3.  Pt will decrease max LBP to <4/10 ?Baseline: 5/10 - 02/05/22 ?Goal status: Ongoing ?  ?4.  Pt will improve on 5x STS tes

## 2022-02-10 LAB — LAB REPORT - SCANNED
Calcium: 9.3
EGFR: 65
EGFR: 65
PTH: 152

## 2022-02-12 ENCOUNTER — Encounter (HOSPITAL_BASED_OUTPATIENT_CLINIC_OR_DEPARTMENT_OTHER): Payer: Self-pay | Admitting: Physical Therapy

## 2022-02-12 ENCOUNTER — Ambulatory Visit (HOSPITAL_BASED_OUTPATIENT_CLINIC_OR_DEPARTMENT_OTHER): Payer: Medicare Other | Admitting: Physical Therapy

## 2022-02-12 ENCOUNTER — Encounter (HOSPITAL_BASED_OUTPATIENT_CLINIC_OR_DEPARTMENT_OTHER): Payer: Self-pay

## 2022-02-12 DIAGNOSIS — R262 Difficulty in walking, not elsewhere classified: Secondary | ICD-10-CM

## 2022-02-12 DIAGNOSIS — M5459 Other low back pain: Secondary | ICD-10-CM

## 2022-02-12 DIAGNOSIS — M6281 Muscle weakness (generalized): Secondary | ICD-10-CM

## 2022-02-12 NOTE — Therapy (Signed)
[facility] ?[address] ?[place], [place], [address] ?Phone: [phone]   Fax:  [phone] ? ?Patient Details  ?Name: Dawn Lucas ?MRN: [id number] ?Date of Birth: 1956/05/25 ?Referring Provider:  Sonia Side., FNP ? ?Encounter Date: 02/12/2022 ? ?Pt arrived and reported she had seen her Dr on Friday, May 5th and was told her BP was elevated.  Per her report, she was given a dose of BP medicine in his office and was issued Rx for BP medicine.  She requested I check her BP prior to starting session.   ? ?Seated, in pool environment:  169/112, HR 71 ?Seated, in hallway 5 min later: 155/107, HR 67 ?Pt states she has not yet picked up her prescriptions.  ? ?Pt not seen for treatment due to elevated BP.  Encouraged pt to begin BP meds as directed by Dr.  Aletha Halim continue to monitor as indicated. ? ?Kerin Perna, PTA ?02/12/22 12:26 PM ? ?[facility] ?[address] ?[address] ?[place], [place], [address] ?Phone: [phone]   Fax:  [phone] ?

## 2022-02-14 ENCOUNTER — Encounter (HOSPITAL_BASED_OUTPATIENT_CLINIC_OR_DEPARTMENT_OTHER): Payer: Self-pay | Admitting: Physical Therapy

## 2022-02-14 ENCOUNTER — Ambulatory Visit (HOSPITAL_BASED_OUTPATIENT_CLINIC_OR_DEPARTMENT_OTHER): Payer: Medicare Other | Admitting: Physical Therapy

## 2022-02-14 DIAGNOSIS — M5459 Other low back pain: Secondary | ICD-10-CM

## 2022-02-14 DIAGNOSIS — M5136 Other intervertebral disc degeneration, lumbar region with discogenic back pain only: Secondary | ICD-10-CM

## 2022-02-14 DIAGNOSIS — M6281 Muscle weakness (generalized): Secondary | ICD-10-CM | POA: Diagnosis not present

## 2022-02-14 DIAGNOSIS — R262 Difficulty in walking, not elsewhere classified: Secondary | ICD-10-CM

## 2022-02-14 NOTE — Therapy (Signed)
?OUTPATIENT PHYSICAL THERAPY TREATMENT NOTE ? ?Progress Note/Recert ? ?Reporting Period 12/26/21 to 02/14/22 ? ?See note below for Objective Data and Assessment of Progress/Goals.  ? ?  ?Patient Name: Dawn Lucas ?MRN: [id number] ?DOB:01/24/1956, 66 y.o., female ?Today's Date: 02/14/2022 ? ?PCP: Smith, Fred A Jr., FNP ?REFERRING PROVIDER: Smith, Fred A Jr., FNP ? ?END OF SESSION:  ? PT End of Session - 02/08/2022   ? ? Visit Number 9  ? Number of Visits 26  ? Date for PT Re-Evaluation 04/11/22   ? Authorization Type MCR/ medicaid   ? Progress Note Due on Visit 10   ? PT Start Time 1034  ? PT Stop Time 1117  ? PT Time Calculation (min) 43 min   ? Activity Tolerance Patient tolerated treatment well   ? Behavior During Therapy WFL for tasks assessed/performed   ? ?  ?  ? ?  ? ? ?Past Medical History:  ?Diagnosis Date  ? Anemia   ? Arthritis   ? hands, knees  ? Asthma   ? Back pain   ? Fibromyalgia   ? Headache   ? Hyperlipidemia   ? Hypertension 11/04/2011  ? Poor dental hygiene   ? missing and chipped teeth  ? Sleep apnea   ? Uses CPAP nightly  ? SVD (spontaneous vaginal delivery)   ? x 1  ? UTI (lower urinary tract infection)   ? ?Past Surgical History:  ?Procedure Laterality Date  ? CHOLECYSTECTOMY    ? COLON SURGERY    ? DILATION AND CURETTAGE OF UTERUS    ? MAB x 4  ? HYSTEROSCOPY WITH D & C N/A 02/23/2018  ? Procedure: DILATATION AND CURETTAGE /HYSTEROSCOPY;  Surgeon: Constant, Peggy, MD;  Location: [facility];  Service: Gynecology;  Laterality: N/A;  ? OVARIAN CYST REMOVAL    ? ?Patient Active Problem List  ? Diagnosis Date Noted  ? Family history of malignant neoplasm of gastrointestinal tract 12/20/2021  ? Personal history of colonic polyps 12/20/2021  ? Thickened endometrium   ? Prediabetes 04/28/2015  ? Pes planus of both feet 03/28/2015  ? Seborrheic keratoses 08/22/2014  ? Headache(784.0) 05/17/2014  ? Lower extremity edema 09/22/2013  ? Vitamin D deficiency 09/22/2013  ? Tendinitis of left rotator cuff 06/17/2013   ? Right ankle pain 06/17/2013  ? Colon cancer screening 06/17/2013  ? Knee pain, bilateral 03/20/2012  ? Hypertension 11/04/2011  ? Generalized pain 10/15/2011  ? Back pain 02/08/2011  ? Obese 02/08/2011  ? Tobacco abuse 02/08/2011  ? Sleep disorder 02/08/2011  ? ? ?REFERRING DIAG:  R53.81 (ICD-10-CM) - Debility ?  ? ?THERAPY DIAG:  ?Muscle weakness (generalized) - Plan: PT plan of care cert/re-cert ? ?Difficulty in walking, not elsewhere classified - Plan: PT plan of care cert/re-cert ? ?Discogenic lumbar pain - Plan: PT plan of care cert/re-cert ? ?PERTINENT HISTORY: Debility due to covid ?Fibromyalgia syndrome with widespread body pain ?Ankle edema ?Gout ?Asthma ?HTN ? ?PRECAUTIONS: none ? ?SUBJECTIVE: pt has gotten and has been taking her BP meds.  I feel pressure in my back not pain. Relieves after I go to bathroom. They want me to have an colonoscopy.   ? ?PAIN:  ?Are you having pain? No ?NPRS scale: 0/10  ?Pain location: LB ?Pain description: achey, occasionally sharp ?Aggravating factors: walking, standing ?Relieving factors: sitting, resting ?  ?PRECAUTIONS: None ?  ?WEIGHT BEARING RESTRICTIONS No ?  ?FALLS:  ?Has patient fallen in last 6 months? No, Number of falls: 0 ?  ?  LIVING ENVIRONMENT: ?Lives with: lives alone ?Lives in: House/apartment ?Stairs: No;  ?Has following equipment at home: None ?  ?OCCUPATION: retired ?  ?PLOF: Independent with basic ADLs ?  ?PATIENT GOALS  walk better with less pain and less SOB ?  ?  ?OBJECTIVE: * Findings taken at [facility] unless otherwise noted.  ?  ?DIAGNOSTIC FINDINGS: Hallux valgus deformity left foot.  Capsulitis with hammertoe deformity second metatarsal phalangeal joint left.  Capsulitis of the subtalar joint right foot. ?  ?PATIENT SURVEYS:  ?FOTO 52  with predicted goal of: 28 ?  ?COGNITION: ?          Overall cognitive status: Within functional limits for tasks assessed               ?           ?SENSATION: ?WFL ?Ankles/feet swollen ?  ?  ?POSTURE:  ?Knee hyper  extension, bilateral valgus deformity knees ?  ?PALPATION: ?Minimally sensitive with deep pressure LB ?  ?LE ROM: ?  ?Active ROM Right ?12/26/2021 Left ?12/26/2021  ?Hip flexion 110 limited by pain wfl  ?Hip extension      ?Hip abduction      ?Hip adduction      ?Hip internal rotation      ?Hip external rotation      ?Knee flexion      ?Knee extension      ?Ankle dorsiflexion      ?Ankle plantarflexion      ?Ankle inversion      ?Ankle eversion      ? (Blank rows = not tested) ?  ?LE MMT: ?  ?MMT Right ?12/26/2021 Left ?12/26/2021  ?Hip flexion 3+/5 limited to pain 4/5  ?Hip extension      ?Hip abduction 5 5  ?Hip adduction 5 5  ?       ?       ?Knee flexion 5 5  ?Knee extension 5 5  ? Ankle 5/5 ?  ?Lumbar spine ROM: flexion 10% limited to pain ?                                 Side bending R/L 75%  ?                                           Left rotation 25% limited to pain/Right 50% ?  ?FUNCTIONAL TESTS:  ?5 times sit to stand: 22   02/14/22 18s ?Timed up and go (TUG): 20  02/14/22 13 ?3 minute walk test O2 sats drop to 85% 317f  02/14/22  475 ft  O2 Sats 92% ?  ?GAIT: ?Distance walked: 300 ?Assistive device utilized: None ?Level of assistance: Modified independence requires frequent rest periods ?Comments: cadence slowed, decreased arm swing, guarded ?  ?  ?  ?TODAY'S TREATMENT:   ? ?132/72 BP Sats 97, pulse 71 ? ? ?Pt seen for aquatic therapy today.  Treatment took place in water 3.25-4 ft [facility] Temp of water was 92?.  Pt entered/exited the pool via stairs step to independently with bilat rail.  ? ?Wlaking forward, back and side stepping x 3 widths ? ?Lumbar rotation with yellow kickboard, 2x10. Some discomfort with right rotation but tolerated ? ? ?Warm up without UE support:  Walking 3  laps each forward and back. Side stepping x 3 laps ?Walking forward with rainbow hand buoys under water ?Holding wall:  squats x 10; hip flex to/from extension x10 R/L; hip openers x 10; hip /knee  flex crossing midline x 10 ?Holding yellow noodle:  Heel raises x 10;  side step into squat followed by gentle press down for ab set -1 lap;   ?Without support: Quick backwards walking  x 2 laps  ?Seated on bench in water (blue step under feet): STS x 10 ?Lt forward step ups / Rt forward step ups x 10 each LE with intermittent unilateral support ? ?Kickboard pushdowns x 5 (board flat on water) ? ? ?Pt requires buoyancy for support and to offload joints with strengthening exercises. Viscosity of the water is needed for resistance of strengthening; water current perturbations provides challenge to standing balance unsupported, requiring increased core activation. ? ?  ?PATIENT EDUCATION:  ?Education details: management of condition ?Person educated: Patient ?Education method: Explanation ?Education comprehension: verbalized understanding ?  ?  ?HOME EXERCISE PROGRAM: ?[id number] from previous episode ?Pt instructed to walk daily x 10 minutes resting as needed. ?  ?ASSESSMENT: ?  ?CLINICAL IMPRESSION: ?Pt has demonstrated improvements in all areas of function. She has not been walking from the bus stop to therapy pool due to more convenient transportation but she does use bus system on a weekly basis. She reports needing to stop 4x on way from home up hill to her bus stop to access community.  All testing (STS, TUG and 3 MWT) demonstrates improvement as noted above in bold, although has not yet met goals associated with them. Significant improvement with O2 sats with 3 MWT remaining above 92%.  She reports her pain she was having in her lb at initial eval has greatly improved but "pressure" remains. MD addressing.  She will continue to benefit from skilled aquatic physical therapy to progress towards meeting all goals returning her to PLOF. ? ? ?  ?  ?OBJECTIVE IMPAIRMENTS cardiopulmonary status limiting activity, decreased activity tolerance, decreased endurance, decreased mobility, difficulty walking, and decreased  strength.  ?  ?ACTIVITY LIMITATIONS cleaning, community activity, meal prep, shopping, and church.  ?  ?PERSONAL FACTORS Age, Fitness, and 3+ comorbidities: asthma, fibromyalgia, HTN  and gout are also a

## 2022-02-19 ENCOUNTER — Ambulatory Visit (HOSPITAL_BASED_OUTPATIENT_CLINIC_OR_DEPARTMENT_OTHER): Payer: Medicare Other | Admitting: Physical Therapy

## 2022-02-19 DIAGNOSIS — R262 Difficulty in walking, not elsewhere classified: Secondary | ICD-10-CM

## 2022-02-19 DIAGNOSIS — M6281 Muscle weakness (generalized): Secondary | ICD-10-CM | POA: Diagnosis not present

## 2022-02-19 DIAGNOSIS — M5459 Other low back pain: Secondary | ICD-10-CM

## 2022-02-19 NOTE — Therapy (Signed)
?OUTPATIENT PHYSICAL THERAPY TREATMENT NOTE ? ? ?  ?Patient Name: Dawn Lucas ?MRN: [id number] ?DOB:01/28/1956, 66 y.o., female ?Today's Date: 02/19/2022 ? ?PCP: Sonia Side., FNP ?REFERRING PROVIDER: Sonia Side., FNP ? ?END OF SESSION:  ? PT End of Session -  ? ? Visit Number 11  ? Number of Visits 26  ? Date for PT Re-Evaluation 04/11/22   ? Authorization Type MCR/ medicaid   ? Progress Note Due on Visit 20  ? PT Start Time 1146  ? PT Stop Time 9449  ? PT Time Calculation (min) 43 min   ? Activity Tolerance Patient tolerated treatment well   ? Behavior During Therapy [facility] for tasks assessed/performed   ? ?  ?  ? ?  ? ? ?Past Medical History:  ?Diagnosis Date  ? Anemia   ? Arthritis   ? hands, knees  ? Asthma   ? Back pain   ? Fibromyalgia   ? Headache   ? Hyperlipidemia   ? Hypertension 11/04/2011  ? Poor dental hygiene   ? missing and chipped teeth  ? Sleep apnea   ? Uses CPAP nightly  ? SVD (spontaneous vaginal delivery)   ? x 1  ? UTI (lower urinary tract infection)   ? ?Past Surgical History:  ?Procedure Laterality Date  ? CHOLECYSTECTOMY    ? COLON SURGERY    ? DILATION AND CURETTAGE OF UTERUS    ? MAB x 4  ? HYSTEROSCOPY WITH D & C N/A 02/23/2018  ? Procedure: DILATATION AND CURETTAGE /HYSTEROSCOPY;  Surgeon: Mora Bellman, MD;  Location: [facility];  Service: Gynecology;  Laterality: N/A;  ? OVARIAN CYST REMOVAL    ? ?Patient Active Problem List  ? Diagnosis Date Noted  ? Family history of malignant neoplasm of gastrointestinal tract 12/20/2021  ? Personal history of colonic polyps 12/20/2021  ? Thickened endometrium   ? Prediabetes 04/28/2015  ? Pes planus of both feet 03/28/2015  ? Seborrheic keratoses 08/22/2014  ? Headache(784.0) 05/17/2014  ? Lower extremity edema 09/22/2013  ? Vitamin D deficiency 09/22/2013  ? Tendinitis of left rotator cuff 06/17/2013  ? Right ankle pain 06/17/2013  ? Colon cancer screening 06/17/2013  ? Knee pain, bilateral 03/20/2012  ? Hypertension 11/04/2011  ?  Generalized pain 10/15/2011  ? Back pain 02/08/2011  ? Obese 02/08/2011  ? Tobacco abuse 02/08/2011  ? Sleep disorder 02/08/2011  ? ? ?REFERRING DIAG:  R53.81 (ICD-10-CM) - Debility ?  ? ?THERAPY DIAG:  ?No diagnosis found. ? ?PERTINENT HISTORY: Debility due to covid ?Fibromyalgia syndrome with widespread body pain ?Ankle edema ?Gout ?Asthma ?HTN ? ?PRECAUTIONS: none ? ?SUBJECTIVE:  Pt reports that she has been taking her new BP medicine daily.  She reports her tailbone hurts, "just enough to know it's there".   "I feel great".  ?PAIN:  ?Are you having pain? yes ?NPRS scale: 1-2/10  ?Pain location: coccyx  ?Pain description: achey, occasionally sharp ?Aggravating factors: walking, standing ?Relieving factors: sitting, resting ?  ?PRECAUTIONS: None ?  ?WEIGHT BEARING RESTRICTIONS No ?  ?FALLS:  ?Has patient fallen in last 6 months? No, Number of falls: 0 ?  ?LIVING ENVIRONMENT: ?Lives with: lives alone ?Lives in: House/apartment ?Stairs: No;  ?Has following equipment at home: None ?  ?OCCUPATION: retired ?  ?PLOF: Independent with basic ADLs ?  ?PATIENT GOALS  walk better with less pain and less SOB ?  ?  ?OBJECTIVE: * Findings taken at [facility] unless otherwise noted.  ?  ?  DIAGNOSTIC FINDINGS: Hallux valgus deformity left foot.  Capsulitis with hammertoe deformity second metatarsal phalangeal joint left.  Capsulitis of the subtalar joint right foot. ?  ?PATIENT SURVEYS:  ?FOTO 52  with predicted goal of: 46 ?  ?COGNITION: ?          Overall cognitive status: Within functional limits for tasks assessed               ?           ?SENSATION: ?WFL ?Ankles/feet swollen ?  ?  ?POSTURE:  ?Knee hyper extension, bilateral valgus deformity knees ?  ?PALPATION: ?Minimally sensitive with deep pressure LB ?  ?LE ROM: ?  ?Active ROM Right ?12/26/2021 Left ?12/26/2021  ?Hip flexion 110 limited by pain wfl  ?Hip extension      ?Hip abduction      ?Hip adduction      ?Hip internal rotation      ?Hip external rotation      ?Knee flexion       ?Knee extension      ?Ankle dorsiflexion      ?Ankle plantarflexion      ?Ankle inversion      ?Ankle eversion      ? (Blank rows = not tested) ?  ?LE MMT: ?  ?MMT Right ?12/26/2021 Left ?12/26/2021  ?Hip flexion 3+/5 limited to pain 4/5  ?Hip extension      ?Hip abduction 5 5  ?Hip adduction 5 5  ?       ?       ?Knee flexion 5 5  ?Knee extension 5 5  ? Ankle 5/5 ?  ?Lumbar spine ROM: flexion 10% limited to pain ?                                 Side bending R/L 75%  ?                                           Left rotation 25% limited to pain/Right 50% ?  ?FUNCTIONAL TESTS:  ?5 times sit to stand: 22   02/14/22 18s ?Timed up and go (TUG): 20  02/14/22 13 ?3 minute walk test O2 sats drop to 85% 345f  02/14/22  475 ft  O2 Sats 92% ?  ?GAIT: ?Distance walked: 300 ?Assistive device utilized: None ?Level of assistance: Modified independence requires frequent rest periods ?Comments: cadence slowed, decreased arm swing, guarded ?  ?  ?  ?TODAY'S TREATMENT:   ?Pt seen for aquatic therapy today.  Treatment took place in water 3.25-[facility] Temp of water was 92?.  Pt entered/exited the pool via stairs step to independently with bilat rail.  ? ?Walking forward, back and side stepping x 3 laps ?Split squats holding wall x 10 each ?TUG like exercise from bench (untimed) with increased walking speed, no UE support x 5 ?Lt forward step ups / Rt forward step ups x 10 each LE with intermittent unilateral support ?Seated on bench, feet on blue step:  STS x 10 without UE support;  lumbar flexion x 5 -arms on kickboard, repeated with Lumbar rotation x 2 each direction for LB stretch ?Holding wall: squat to/from side kick x 10 each LE ?Without support: Quick backwards walking  x 2 laps  ? ?  Pt requires buoyancy for support and to offload joints with strengthening exercises. Viscosity of the water is needed for resistance of strengthening; water current perturbations provides challenge to standing  balance unsupported, requiring increased core activation. ? ?  ?PATIENT EDUCATION:  ?Education details: management of condition ?Person educated: Patient ?Education method: Explanation ?Education comprehension: verbalized understanding ?  ?  ?HOME EXERCISE PROGRAM: ?[id number] from previous episode ?Pt instructed to walk daily x 10 minutes resting as needed. ?  ?ASSESSMENT: ?  ?CLINICAL IMPRESSION: ?Pt able to tolerate walking in water at faster speed for TUG like exercise.  No increase in LBP during session. She complained of increased heaviness/difficulty with LLE with backwards walk at quick speed.  She is making great progress towards LTGs and  will continue to benefit from skilled aquatic physical therapy to progress towards meeting all goals returning her to PLOF. ? ? ?  ?  ?OBJECTIVE IMPAIRMENTS cardiopulmonary status limiting activity, decreased activity tolerance, decreased endurance, decreased mobility, difficulty walking, and decreased strength.  ?  ?ACTIVITY LIMITATIONS cleaning, community activity, meal prep, shopping, and church.  ?  ?PERSONAL FACTORS Age, Fitness, and 3+ comorbidities: asthma, fibromyalgia, HTN  and gout are also affecting patient's functional outcome.  ?  ?  ?REHAB POTENTIAL: Good ?  ?CLINICAL DECISION MAKING: Evolving/moderate complexity ?  ?EVALUATION COMPLEXITY: Moderate ?  ?  ?GOALS: ?Goals reviewed with patient? Yes ?  ?SHORT TERM GOALS: Target date: 01/23/2022 ?  ?Pt will walk from parking lot to pool without rest period ?Baseline:  ?Goal status:  Achieved ?  ?2.  Pt to be indep and complaint with land based HEP ?Baseline: stopped completing ?Goal status: Partially met; is only walking (for land HEP) ?  ?3.  Pt will decrease max LBP to <4/10 ?Baseline: 5/10 - 02/05/22 ?Goal status: Achieved but can flare ?  ?4.  Pt will improve on 5x STS test to under 18s to demonstrate improved strength and decrease pain ?Baseline: 22s  18s ?Goal status: Ongoing ?  ?  ?  ?LONG TERM GOALS: Target  date: 02/20/2022 ?  ?Pt will amb distance from bus stop to pool with no more than 2 rest periods or (added 02/14/22) from home up hill to bus stop. ?Baseline: 5-6 ?Goal status:Ongoing ?  ?2.  FOTO to meet stated goa

## 2022-02-21 ENCOUNTER — Ambulatory Visit (HOSPITAL_BASED_OUTPATIENT_CLINIC_OR_DEPARTMENT_OTHER): Payer: Medicare Other | Admitting: Physical Therapy

## 2022-02-21 ENCOUNTER — Encounter (HOSPITAL_BASED_OUTPATIENT_CLINIC_OR_DEPARTMENT_OTHER): Payer: Self-pay | Admitting: Physical Therapy

## 2022-02-21 DIAGNOSIS — M5459 Other low back pain: Secondary | ICD-10-CM

## 2022-02-21 DIAGNOSIS — M6281 Muscle weakness (generalized): Secondary | ICD-10-CM | POA: Diagnosis not present

## 2022-02-21 DIAGNOSIS — R262 Difficulty in walking, not elsewhere classified: Secondary | ICD-10-CM

## 2022-02-21 NOTE — Therapy (Signed)
OUTPATIENT PHYSICAL THERAPY TREATMENT NOTE     Patient Name: Dawn Lucas MRN: [id number] DOB:[birth year], 66 y.o., female Today's Date: 02/21/2022  PCP: Sonia Side., FNP REFERRING PROVIDER: Sonia Side., FNP  END OF SESSION:   PT End of Session -    Visit Number 11   Number of Visits 26   Date for PT Re-Evaluation 04/11/22    Authorization Type MCR/ medicaid    Progress Note Due on Visit 20   PT Start Time 1146   PT Stop Time 1229   PT Time Calculation (min) 43 min    Activity Tolerance Patient tolerated treatment well    Behavior During Therapy WFL for tasks assessed/performed             Past Medical History:  Diagnosis Date   Anemia    Arthritis    hands, knees   Asthma    Back pain    Fibromyalgia    Headache    Hyperlipidemia    Hypertension 11/04/2011   Poor dental hygiene    missing and chipped teeth   Sleep apnea    Uses CPAP nightly   SVD (spontaneous vaginal delivery)    x 1   UTI (lower urinary tract infection)    Past Surgical History:  Procedure Laterality Date   CHOLECYSTECTOMY     COLON SURGERY     DILATION AND CURETTAGE OF UTERUS     MAB x 4   HYSTEROSCOPY WITH D & C N/A 02/23/2018   Procedure: DILATATION AND CURETTAGE /HYSTEROSCOPY;  Surgeon: Mora Bellman, MD;  Location: [facility];  Service: Gynecology;  Laterality: N/A;   OVARIAN CYST REMOVAL     Patient Active Problem List   Diagnosis Date Noted   Family history of malignant neoplasm of gastrointestinal tract 12/20/2021   Personal history of colonic polyps 12/20/2021   Thickened endometrium    Prediabetes 04/28/2015   Pes planus of both feet 03/28/2015   Seborrheic keratoses 08/22/2014   Headache(784.0) 05/17/2014   Lower extremity edema 09/22/2013   Vitamin D deficiency 09/22/2013   Tendinitis of left rotator cuff 06/17/2013   Right ankle pain 06/17/2013   Colon cancer screening 06/17/2013   Knee pain, bilateral 03/20/2012   Hypertension 11/04/2011    Generalized pain 10/15/2011   Back pain 02/08/2011   Obese 02/08/2011   Tobacco abuse 02/08/2011   Sleep disorder 02/08/2011    REFERRING DIAG:  R53.81 (ICD-10-CM) - Debility    THERAPY DIAG:  Muscle weakness (generalized)  Difficulty in walking, not elsewhere classified  Discogenic lumbar pain  PERTINENT HISTORY: Debility due to covid Fibromyalgia syndrome with widespread body pain Ankle edema Gout Asthma HTN  PRECAUTIONS: none  SUBJECTIVE:  "Had to take bus today.  My lb and right hip are really hurting form all the walking I had to do and I'm tired" PAIN:  Are you having pain? yes NPRS scale: 5/10  Pain location: LB and right hip Pain description: achey, occasionally sharp Aggravating factors: walking, standing Relieving factors: sitting, resting   PRECAUTIONS: None   WEIGHT BEARING RESTRICTIONS No   FALLS:  Has patient fallen in last 6 months? No, Number of falls: 0   LIVING ENVIRONMENT: Lives with: lives alone Lives in: House/apartment Stairs: No;  Has following equipment at home: None   OCCUPATION: retired   PLOF: Independent with basic ADLs   PATIENT GOALS  walk better with less pain and less SOB     OBJECTIVE: * Findings taken  at [facility] unless otherwise noted.    DIAGNOSTIC FINDINGS: Hallux valgus deformity left foot.  Capsulitis with hammertoe deformity second metatarsal phalangeal joint left.  Capsulitis of the subtalar joint right foot.   PATIENT SURVEYS:  FOTO 52  with predicted goal of: 11   COGNITION:           Overall cognitive status: Within functional limits for tasks assessed                          SENSATION: WFL Ankles/feet swollen     POSTURE:  Knee hyper extension, bilateral valgus deformity knees   PALPATION: Minimally sensitive with deep pressure LB   LE ROM:   Active ROM Right 12/26/2021 Left 12/26/2021  Hip flexion 110 limited by pain wfl  Hip extension      Hip abduction      Hip adduction      Hip internal  rotation      Hip external rotation      Knee flexion      Knee extension      Ankle dorsiflexion      Ankle plantarflexion      Ankle inversion      Ankle eversion       (Blank rows = not tested)   LE MMT:   MMT Right 12/26/2021 Left 12/26/2021  Hip flexion 3+/5 limited to pain 4/5  Hip extension      Hip abduction 5 5  Hip adduction 5 5                Knee flexion 5 5  Knee extension 5 5   Ankle 5/5   Lumbar spine ROM: flexion 10% limited to pain                                  Side bending R/L 75%                                             Left rotation 25% limited to pain/Right 50%   FUNCTIONAL TESTS:  5 times sit to stand: 22   02/14/22 18s Timed up and go (TUG): 20  02/14/22 13 3  minute walk test O2 sats drop to 85% 377f  02/14/22  475 ft  O2 Sats 92%   GAIT: Distance walked: 300 Assistive device utilized: None Level of assistance: Modified independence requires frequent rest periods Comments: cadence slowed, decreased arm swing, guarded       TODAY'S TREATMENT:   Pt seen for aquatic therapy today.  Treatment took place in water 3.25-4 ft [facility] Temp of water was 92.  Pt entered/exited the pool via stairs step to independently with bilat rail.   Walking forward, back and side stepping multiple widths Moved pt deeper for exercises to 4.0 ft Ue supported on 1 foam hand buoys: tr; hr; marching, add/abd x10. Cues for core control and decreased guarding of upper body. Seated on 3rd step STS x 10 without UE support. Cues for weight shift and immediate standing balance -cylcing; SLR flutter kicking); add/abd 3x25 reps.  Pt completes slowly Standing lumbar rotation r/L x12.  Pt requires buoyancy for support and to offload joints with strengthening exercises. Viscosity of the water is needed for  resistance of strengthening; water current perturbations provides challenge to standing balance unsupported, requiring increased core  activation.    PATIENT EDUCATION:  Education details: management of condition Person educated: Patient Education method: Explanation Education comprehension: verbalized understanding     HOME EXERCISE PROGRAM: [id number] from previous episode Pt instructed to walk daily x 10 minutes resting as needed.   ASSESSMENT:   CLINICAL IMPRESSION: Pt with increased LB and right hip pain due to taking bus from home today which includes uphill long walk from home to bus then from bus stop to pool ~> 155f.  She reports 3 standing rest periods to bus then 1 seated rest period to pool. Pt tolerates aquatic sessions with improvement when transportation is able to drop her at the door.  She is limited today due to pain.  Session modified pt requiring many rest periods.  Goals ongoing         OBJECTIVE IMPAIRMENTS cardiopulmonary status limiting activity, decreased activity tolerance, decreased endurance, decreased mobility, difficulty walking, and decreased strength.    ACTIVITY LIMITATIONS cleaning, community activity, meal prep, shopping, and church.    PERSONAL FACTORS Age, Fitness, and 3+ comorbidities: asthma, fibromyalgia, HTN  and gout are also affecting patient's functional outcome.      REHAB POTENTIAL: Good   CLINICAL DECISION MAKING: Evolving/moderate complexity   EVALUATION COMPLEXITY: Moderate     GOALS: Goals reviewed with patient? Yes   SHORT TERM GOALS: Target date: 01/23/2022   Pt will walk from parking lot to pool without rest period Baseline:  Goal status:  Achieved   2.  Pt to be indep and complaint with land based HEP Baseline: stopped completing Goal status: Partially met; is only walking (for land HEP)   3.  Pt will decrease max LBP to <4/10 Baseline: 5/10 - 02/05/22 Goal status: Achieved but can flare   4.  Pt will improve on 5x STS test to under 18s to demonstrate improved strength and decrease pain Baseline: 22s  18s Goal status: Ongoing       LONG  TERM GOALS: Target date: 02/20/2022   Pt will amb distance from bus stop to pool with no more than 2 rest periods or (added 02/14/22) from home up hill to bus stop. Baseline: 5-6 Goal status:Ongoing   2.  FOTO to meet stated goal Baseline: 52 Goal status: ongoing   3.  Pt will improve on 3 minute walk test to >=650 ft Baseline: 3033f   [id number] status: Ongoing   4.  Pt will improve on TUG test to <=12s to demonstrate improved mobility and balance Timed up and go (TUG): 20 Baseline: 20s   13s Goal status: ongoing         PLAN: PT FREQUENCY: 1-2x/week   PT DURATION: 8 weeks   PLANNED INTERVENTIONS: Therapeutic exercises, Therapeutic activity, Neuromuscular re-education, Balance training, Gait training, Patient/Family education, Joint mobilization, Stair training, Aquatic Therapy, Taping, and Manual therapy   PLAN FOR NEXT SESSION: aquatics for core and LE strengthening, aerobic capacity retraining, Balance and stretching.   MarStanton KidneyrTharon Aquasiemba MPT 02/21/22 11:51 AM

## 2022-02-26 ENCOUNTER — Ambulatory Visit (HOSPITAL_BASED_OUTPATIENT_CLINIC_OR_DEPARTMENT_OTHER): Payer: Medicare Other | Admitting: Physical Therapy

## 2022-02-26 DIAGNOSIS — M5459 Other low back pain: Secondary | ICD-10-CM

## 2022-02-26 DIAGNOSIS — R262 Difficulty in walking, not elsewhere classified: Secondary | ICD-10-CM

## 2022-02-26 DIAGNOSIS — M6281 Muscle weakness (generalized): Secondary | ICD-10-CM | POA: Diagnosis not present

## 2022-02-26 NOTE — Therapy (Signed)
OUTPATIENT PHYSICAL THERAPY TREATMENT NOTE     Patient Name: SALEHA KALP MRN: [id number] DOB:1956-09-29, 66 y.o., female Today's Date: 02/26/2022  PCP: Sonia Side., FNP REFERRING PROVIDER: Sonia Side., FNP  END OF SESSION:   PT End of Session -    Visit Number 13   Number of Visits 26   Date for PT Re-Evaluation 04/11/22    Authorization Type MCR/ medicaid    Progress Note Due on Visit 20   PT Start Time 1140   PT Stop Time 1225   PT Time Calculation (min) 45 min    Activity Tolerance Patient tolerated treatment well    Behavior During Therapy WFL for tasks assessed/performed             Past Medical History:  Diagnosis Date   Anemia    Arthritis    hands, knees   Asthma    Back pain    Fibromyalgia    Headache    Hyperlipidemia    Hypertension 11/04/2011   Poor dental hygiene    missing and chipped teeth   Sleep apnea    Uses CPAP nightly   SVD (spontaneous vaginal delivery)    x 1   UTI (lower urinary tract infection)    Past Surgical History:  Procedure Laterality Date   CHOLECYSTECTOMY     COLON SURGERY     DILATION AND CURETTAGE OF UTERUS     MAB x 4   HYSTEROSCOPY WITH D & C N/A 02/23/2018   Procedure: DILATATION AND CURETTAGE /HYSTEROSCOPY;  Surgeon: Mora Bellman, MD;  Location: [facility];  Service: Gynecology;  Laterality: N/A;   OVARIAN CYST REMOVAL     Patient Active Problem List   Diagnosis Date Noted   Family history of malignant neoplasm of gastrointestinal tract 12/20/2021   Personal history of colonic polyps 12/20/2021   Thickened endometrium    Prediabetes 04/28/2015   Pes planus of both feet 03/28/2015   Seborrheic keratoses 08/22/2014   Headache(784.0) 05/17/2014   Lower extremity edema 09/22/2013   Vitamin D deficiency 09/22/2013   Tendinitis of left rotator cuff 06/17/2013   Right ankle pain 06/17/2013   Colon cancer screening 06/17/2013   Knee pain, bilateral 03/20/2012   Hypertension 11/04/2011    Generalized pain 10/15/2011   Back pain 02/08/2011   Obese 02/08/2011   Tobacco abuse 02/08/2011   Sleep disorder 02/08/2011    REFERRING DIAG:  R53.81 (ICD-10-CM) - Debility    THERAPY DIAG:  Muscle weakness (generalized)  Difficulty in walking, not elsewhere classified  Discogenic lumbar pain  PERTINENT HISTORY: Debility due to covid Fibromyalgia syndrome with widespread body pain Ankle edema Gout Asthma HTN  PRECAUTIONS: none  SUBJECTIVE:  Pt reports that she has some Lt hip pain today, not sure why.  PAIN:  Are you having pain? yes NPRS scale: 2/10  Pain location: LB and bilat Pain description: achey, occasionally sharp Aggravating factors: walking, standing Relieving factors: sitting, resting   PRECAUTIONS: None   WEIGHT BEARING RESTRICTIONS No   FALLS:  Has patient fallen in last 6 months? No, Number of falls: 0   LIVING ENVIRONMENT: Lives with: lives alone Lives in: House/apartment Stairs: No;  Has following equipment at home: None   OCCUPATION: retired   PLOF: Independent with basic ADLs   PATIENT GOALS  walk better with less pain and less SOB     OBJECTIVE: * Findings [facility] at [facility] unless otherwise noted.    DIAGNOSTIC FINDINGS: Hallux valgus  deformity left foot.  Capsulitis with hammertoe deformity second metatarsal phalangeal joint left.  Capsulitis of the subtalar joint right foot.   PATIENT SURVEYS:  FOTO 52  with predicted goal of: 73   COGNITION:           Overall cognitive status: Within functional limits for tasks assessed                          SENSATION: WFL Ankles/feet swollen     POSTURE:  Knee hyper extension, bilateral valgus deformity knees   PALPATION: Minimally sensitive with deep pressure LB   LE ROM:   Active ROM Right 12/26/2021 Left 12/26/2021  Hip flexion 110 limited by pain wfl  Hip extension      Hip abduction      Hip adduction      Hip internal rotation      Hip external rotation      Knee  flexion      Knee extension      Ankle dorsiflexion      Ankle plantarflexion      Ankle inversion      Ankle eversion       (Blank rows = not tested)   LE MMT:   MMT Right 12/26/2021 Left 12/26/2021  Hip flexion 3+/5 limited to pain 4/5  Hip extension      Hip abduction 5 5  Hip adduction 5 5                Knee flexion 5 5  Knee extension 5 5   Ankle 5/5   Lumbar spine ROM: flexion 10% limited to pain                                  Side bending R/L 75%                                             Left rotation 25% limited to pain/Right 50%   FUNCTIONAL TESTS:  5 times sit to stand: 22   02/14/22 18s Timed up and go (TUG): 20  5/11/_0 minute walk test O2 sats drop to 85% 345f  02/14/22  475 ft  O2 Sats 92%   GAIT: Distance walked: 300 Assistive device utilized: None Level of assistance: Modified independence requires frequent rest periods Comments: cadence slowed, decreased arm swing, guarded       TODAY'S TREATMENT:   Pt seen for aquatic therapy today.  Treatment took place in water 3.25-[facility] Temp of water was 92.  Pt entered/exited the pool via stairs step to independently with bilat rail.   Walking forward, back and side stepping multiple widths Trial of trunk rotation with partial lunge and forward punch x 5 each (difficult for coordination) TUG like exercise x 5 reps, with increased speed in water; repeated with side stepping R/L x 5 STS from bench with blue step under feet x 10, slow and controlled  Seated at bench - bicycle (limited balance/ tolerance) Ball (orange 11#) throw to wall in staggered stance x 5 each leg forward (around 448f High knee backward marching SLS without support x 2 reps each LE - RLE up to 20s, LLE up to 10s (with hopping strategy)  Pt requires buoyancy for support and to offload joints with strengthening exercises. Viscosity of the water is needed for resistance of strengthening;  water current perturbations provides challenge to standing balance unsupported, requiring increased core activation.    PATIENT EDUCATION:  Education details: management of condition Person educated: Patient Education method: Explanation Education comprehension: verbalized understanding     HOME EXERCISE PROGRAM: [id number] from previous episode Pt instructed to walk daily x 10 minutes resting as needed.   ASSESSMENT:   CLINICAL IMPRESSION: Pt reporting greater ease at completing STS and TUG like exercises in the water today, compared to previous sessions.  She continues to report increased fatigue in Lt hip flexor with forward walking/ marching. No increase in LBP during session.  She demonstrates decreased L SLS compared to R.  She is making good gains towards all goals.   OBJECTIVE IMPAIRMENTS cardiopulmonary status limiting activity, decreased activity tolerance, decreased endurance, decreased mobility, difficulty walking, and decreased strength.    ACTIVITY LIMITATIONS cleaning, community activity, meal prep, shopping, and church.    PERSONAL FACTORS Age, Fitness, and 3+ comorbidities: asthma, fibromyalgia, HTN  and gout are also affecting patient's functional outcome.      REHAB POTENTIAL: Good   CLINICAL DECISION MAKING: Evolving/moderate complexity   EVALUATION COMPLEXITY: Moderate     GOALS: Goals reviewed with patient? Yes   SHORT TERM GOALS: Target date: 01/23/2022   Pt will walk from parking lot to pool without rest period Baseline:  Goal status:  Achieved   2.  Pt to be indep and complaint with land based HEP Baseline: stopped completing Goal status: Partially met; is only walking (for land HEP)   3.  Pt will decrease max LBP to <4/10 Baseline: 5/10 - 02/05/22 Goal status: Achieved but can flare   4.  Pt will improve on 5x STS test to under 18s to demonstrate improved strength and decrease pain Baseline: 22s,  18s Goal status: Ongoing       LONG TERM  GOALS: Target date: 02/20/2022   Pt will amb distance from bus stop to pool with no more than 2 rest periods or (added 02/14/22) from home up hill to bus stop. Baseline: 5-6 Goal status:Ongoing   2.  FOTO to meet stated goal Baseline: 52 Goal status: ongoing   3.  Pt will improve on 3 minute walk test to >=650 ft Baseline: 3102f ,   [id number] status: Ongoing   4.  Pt will improve on TUG test to <=12s to demonstrate improved mobility and balance Timed up and go (TUG): 20 Baseline: 20s,   13s Goal status: ongoing         PLAN: PT FREQUENCY: 1-2x/week   PT DURATION: 8 weeks   PLANNED INTERVENTIONS: Therapeutic exercises, Therapeutic activity, Neuromuscular re-education, Balance training, Gait training, Patient/Family education, Joint mobilization, Stair training, Aquatic Therapy, Taping, and Manual therapy   PLAN FOR NEXT SESSION: aquatics for core and LE strengthening, aerobic capacity retraining, Balance and stretching.  Lt hip strengthening and SLS.  (Check end of session time for accuracy - not pulling from flow sheet).    JeKerin PernaPTA 02/26/22 12:32 PM

## 2022-02-28 ENCOUNTER — Encounter (HOSPITAL_BASED_OUTPATIENT_CLINIC_OR_DEPARTMENT_OTHER): Payer: Self-pay | Admitting: Physical Therapy

## 2022-02-28 ENCOUNTER — Ambulatory Visit (HOSPITAL_BASED_OUTPATIENT_CLINIC_OR_DEPARTMENT_OTHER): Payer: Medicare Other | Admitting: Physical Therapy

## 2022-02-28 DIAGNOSIS — R262 Difficulty in walking, not elsewhere classified: Secondary | ICD-10-CM

## 2022-02-28 DIAGNOSIS — M6281 Muscle weakness (generalized): Secondary | ICD-10-CM | POA: Diagnosis not present

## 2022-02-28 DIAGNOSIS — M5136 Other intervertebral disc degeneration, lumbar region with discogenic back pain only: Secondary | ICD-10-CM

## 2022-02-28 DIAGNOSIS — M5459 Other low back pain: Secondary | ICD-10-CM

## 2022-02-28 NOTE — Therapy (Signed)
OUTPATIENT PHYSICAL THERAPY TREATMENT NOTE     Patient Name: Dawn Lucas MRN: [id number] DOB:1956-06-09, 66 y.o., female Today's Date: 02/28/2022  PCP: Sonia Side., FNP REFERRING PROVIDER: Sonia Side., FNP  END OF SESSION:   PT End of Session -    Visit Number 14   Number of Visits 26   Date for PT Re-Evaluation 04/11/22    Authorization Type MCR/ medicaid    Progress Note Due on Visit 20   PT Start Time 1147   PT Stop Time 1230   PT Time Calculation (min) 43 min    Activity Tolerance Patient tolerated treatment well    Behavior During Therapy WFL for tasks assessed/performed             Past Medical History:  Diagnosis Date   Anemia    Arthritis    hands, knees   Asthma    Back pain    Fibromyalgia    Headache    Hyperlipidemia    Hypertension 11/04/2011   Poor dental hygiene    missing and chipped teeth   Sleep apnea    Uses CPAP nightly   SVD (spontaneous vaginal delivery)    x 1   UTI (lower urinary tract infection)    Past Surgical History:  Procedure Laterality Date   CHOLECYSTECTOMY     COLON SURGERY     DILATION AND CURETTAGE OF UTERUS     MAB x 4   HYSTEROSCOPY WITH D & C N/A 02/23/2018   Procedure: DILATATION AND CURETTAGE /HYSTEROSCOPY;  Surgeon: Mora Bellman, MD;  Location: [facility];  Service: Gynecology;  Laterality: N/A;   OVARIAN CYST REMOVAL     Patient Active Problem List   Diagnosis Date Noted   Family history of malignant neoplasm of gastrointestinal tract 12/20/2021   Personal history of colonic polyps 12/20/2021   Thickened endometrium    Prediabetes 04/28/2015   Pes planus of both feet 03/28/2015   Seborrheic keratoses 08/22/2014   Headache(784.0) 05/17/2014   Lower extremity edema 09/22/2013   Vitamin D deficiency 09/22/2013   Tendinitis of left rotator cuff 06/17/2013   Right ankle pain 06/17/2013   Colon cancer screening 06/17/2013   Knee pain, bilateral 03/20/2012   Hypertension 11/04/2011    Generalized pain 10/15/2011   Back pain 02/08/2011   Obese 02/08/2011   Tobacco abuse 02/08/2011   Sleep disorder 02/08/2011    REFERRING DIAG:  R53.81 (ICD-10-CM) - Debility    THERAPY DIAG:  Muscle weakness (generalized)  Difficulty in walking, not elsewhere classified  Discogenic lumbar pain  PERTINENT HISTORY: Debility due to covid Fibromyalgia syndrome with widespread body pain Ankle edema Gout Asthma HTN  PRECAUTIONS: none  SUBJECTIVE:  Pt reports her back barely hurts, and she is able to lift her Lt hip forward with less difficulty.  PAIN:  Are you having pain? yes NPRS scale: 1/10  Pain location: LB  Pain description: achey, occasionally sharp Aggravating factors: walking, standing Relieving factors: sitting, resting   PRECAUTIONS: None   WEIGHT BEARING RESTRICTIONS No   FALLS:  Has patient fallen in last 6 months? No, Number of falls: 0   LIVING ENVIRONMENT: Lives with: lives alone Lives in: House/apartment Stairs: No;  Has following equipment at home: None   OCCUPATION: retired   PLOF: Independent with basic ADLs   PATIENT GOALS  walk better with less pain and less SOB     OBJECTIVE: * Findings taken at [facility] unless otherwise noted.  DIAGNOSTIC FINDINGS: Hallux valgus deformity left foot.  Capsulitis with hammertoe deformity second metatarsal phalangeal joint left.  Capsulitis of the subtalar joint right foot.   PATIENT SURVEYS:  FOTO 52  with predicted goal of: 16   COGNITION:           Overall cognitive status: Within functional limits for tasks assessed                          SENSATION: WFL Ankles/feet swollen     POSTURE:  Knee hyper extension, bilateral valgus deformity knees   PALPATION: Minimally sensitive with deep pressure LB   LE ROM:   Active ROM Right 12/26/2021 Left 12/26/2021  Hip flexion 110 limited by pain wfl  Hip extension      Hip abduction      Hip adduction      Hip internal rotation      Hip  external rotation      Knee flexion      Knee extension      Ankle dorsiflexion      Ankle plantarflexion      Ankle inversion      Ankle eversion       (Blank rows = not tested)   LE MMT:   MMT Right 12/26/2021 Left 12/26/2021  Hip flexion 3+/5 limited to pain 4/5  Hip extension      Hip abduction 5 5  Hip adduction 5 5                Knee flexion 5 5  Knee extension 5 5   Ankle 5/5   Lumbar spine ROM: flexion 10% limited to pain                                  Side bending R/L 75%                                             Left rotation 25% limited to pain/Right 50%   FUNCTIONAL TESTS:  5 times sit to stand: 22   02/14/22 18s Timed up and go (TUG): 20  5/11/_0 minute walk test O2 sats drop to 85% 370f  02/14/22  475 ft  O2 Sats 92%   GAIT: Distance walked: 300 Assistive device utilized: None Level of assistance: Modified independence requires frequent rest periods Comments: cadence slowed, decreased arm swing, guarded       TODAY'S TREATMENT:   Pt seen for aquatic therapy today.  Treatment took place in water 3.25-4 ft [facility] Temp of water was 92.  Pt entered/exited the pool via stairs step to independently with bilat rail.   Walking forward, back and side stepping multiple widths Side stepping with arms abdct/ add with rainbow hand buoys Forward/ backward marching holding rainbow hand buoys under water at [employer] forward/ backward with coordinating arms Holding yellow noodle: hip abdct/add x 10; stork stance with hip IR/ER x 10 each LE Holding wall:  Hip flex/ext leg swings x 10 each LE TUG like exercise x 3 reps, with increased speed in water; repeated with side stepping R/L x 4 STS from 4th step in water x 10, slow and controlled descent Forward walking for rest and recovery  Pt requires buoyancy for support and to offload joints with strengthening exercises. Viscosity of the water is needed for resistance  of strengthening; water current perturbations provides challenge to standing balance unsupported, requiring increased core activation.    PATIENT EDUCATION:  Education details: management of condition; exercise progression Person educated: Patient Education method: Explanation Education comprehension: verbalized understanding     HOME EXERCISE PROGRAM: [id number] from previous episode Pt instructed to walk daily x 10 minutes resting as needed.   ASSESSMENT:   CLINICAL IMPRESSION: Pt reporting greater ease with Lt hip flexion with forward walking/ marching today. No increase in LBP during session, only mild tightness after STS.  She continues to demonstrate decreased L SLS compared to R.  She is making good gains towards all goals.   OBJECTIVE IMPAIRMENTS cardiopulmonary status limiting activity, decreased activity tolerance, decreased endurance, decreased mobility, difficulty walking, and decreased strength.    ACTIVITY LIMITATIONS cleaning, community activity, meal prep, shopping, and church.    PERSONAL FACTORS Age, Fitness, and 3+ comorbidities: asthma, fibromyalgia, HTN  and gout are also affecting patient's functional outcome.      REHAB POTENTIAL: Good   CLINICAL DECISION MAKING: Evolving/moderate complexity   EVALUATION COMPLEXITY: Moderate     GOALS: Goals reviewed with patient? Yes   SHORT TERM GOALS: Target date: 01/23/2022   Pt will walk from parking lot to pool without rest period Baseline:  Goal status:  Achieved   2.  Pt to be indep and complaint with land based HEP Baseline: stopped completing Goal status: Partially met; is only walking (for land HEP)   3.  Pt will decrease max LBP to <4/10 Baseline: 5/10 - 02/05/22 Goal status: Achieved but can flare   4.  Pt will improve on 5x STS test to under 18s to demonstrate improved strength and decrease pain Baseline: 22s,  18s Goal status: Ongoing       LONG TERM GOALS: Target date: 02/20/2022   Pt will amb  distance from bus stop to pool with no more than 2 rest periods or (added 02/14/22) from home up hill to bus stop. Baseline: 5-6 Goal status:Ongoing   2.  FOTO to meet stated goal Baseline: 52 Goal status: ongoing   3.  Pt will improve on 3 minute walk test to >=650 ft Baseline: 310f ,   [id number] status: Ongoing   4.  Pt will improve on TUG test to <=12s to demonstrate improved mobility and balance Timed up and go (TUG): 20 Baseline: 20s,   13s Goal status: ongoing         PLAN: PT FREQUENCY: 1-2x/week   PT DURATION: 8 weeks   PLANNED INTERVENTIONS: Therapeutic exercises, Therapeutic activity, Neuromuscular re-education, Balance training, Gait training, Patient/Family education, Joint mobilization, Stair training, Aquatic Therapy, Taping, and Manual therapy   PLAN FOR NEXT SESSION: aquatics for core and LE strengthening, aerobic capacity retraining, Balance and stretching.  Lt hip strengthening and SLS.  (Check end of session time for accuracy - not pulling from flow sheet).    JeKerin PernaPTA 02/28/22 1:29 PM

## 2022-03-05 ENCOUNTER — Ambulatory Visit (HOSPITAL_BASED_OUTPATIENT_CLINIC_OR_DEPARTMENT_OTHER): Payer: Medicare Other | Admitting: Physical Therapy

## 2022-03-05 DIAGNOSIS — M5459 Other low back pain: Secondary | ICD-10-CM

## 2022-03-05 DIAGNOSIS — M6281 Muscle weakness (generalized): Secondary | ICD-10-CM

## 2022-03-05 DIAGNOSIS — R262 Difficulty in walking, not elsewhere classified: Secondary | ICD-10-CM

## 2022-03-05 DIAGNOSIS — M5136 Other intervertebral disc degeneration, lumbar region with discogenic back pain only: Secondary | ICD-10-CM

## 2022-03-05 NOTE — Therapy (Signed)
OUTPATIENT PHYSICAL THERAPY TREATMENT NOTE     Patient Name: Dawn Lucas MRN: [id number] DOB:Dec 13, 1955, 66 y.o., female Today's Date: 03/05/2022  PCP: Sonia Side., FNP REFERRING PROVIDER: Sonia Side., FNP  END OF SESSION:   PT End of Session -    Visit Number 15   Number of Visits 26   Date for PT Re-Evaluation 04/11/22    Authorization Type MCR/ medicaid    Progress Note Due on Visit 20   PT Start Time 1147   PT Stop Time 1230   PT Time Calculation (min) 43 min    Activity Tolerance Patient tolerated treatment well    Behavior During Therapy WFL for tasks assessed/performed             Past Medical History:  Diagnosis Date   Anemia    Arthritis    hands, knees   Asthma    Back pain    Fibromyalgia    Headache    Hyperlipidemia    Hypertension 11/04/2011   Poor dental hygiene    missing and chipped teeth   Sleep apnea    Uses CPAP nightly   SVD (spontaneous vaginal delivery)    x 1   UTI (lower urinary tract infection)    Past Surgical History:  Procedure Laterality Date   CHOLECYSTECTOMY     COLON SURGERY     DILATION AND CURETTAGE OF UTERUS     MAB x 4   HYSTEROSCOPY WITH D & C N/A 02/23/2018   Procedure: DILATATION AND CURETTAGE /HYSTEROSCOPY;  Surgeon: Mora Bellman, MD;  Location: [facility];  Service: Gynecology;  Laterality: N/A;   OVARIAN CYST REMOVAL     Patient Active Problem List   Diagnosis Date Noted   Family history of malignant neoplasm of gastrointestinal tract 12/20/2021   Personal history of colonic polyps 12/20/2021   Thickened endometrium    Prediabetes 04/28/2015   Pes planus of both feet 03/28/2015   Seborrheic keratoses 08/22/2014   Headache(784.0) 05/17/2014   Lower extremity edema 09/22/2013   Vitamin D deficiency 09/22/2013   Tendinitis of left rotator cuff 06/17/2013   Right ankle pain 06/17/2013   Colon cancer screening 06/17/2013   Knee pain, bilateral 03/20/2012   Hypertension 11/04/2011    Generalized pain 10/15/2011   Back pain 02/08/2011   Obese 02/08/2011   Tobacco abuse 02/08/2011   Sleep disorder 02/08/2011    REFERRING DIAG:  R53.81 (ICD-10-CM) - Debility    THERAPY DIAG:  No diagnosis found.  PERTINENT HISTORY: Debility due to covid Fibromyalgia syndrome with widespread body pain Ankle edema Gout Asthma HTN  PRECAUTIONS: none  SUBJECTIVE:  Pt reports her back barely hurts, and she is able to lift her Lt hip forward with less difficulty.  PAIN:  Are you having pain? yes NPRS scale: 1/10  Pain location: L hip Pain description: sore Aggravating factors: walking, standing Relieving factors: sitting, resting   PRECAUTIONS: None   WEIGHT BEARING RESTRICTIONS No   FALLS:  Has patient fallen in last 6 months? No, Number of falls: 0   LIVING ENVIRONMENT: Lives with: lives alone Lives in: House/apartment Stairs: No;  Has following equipment at home: None   OCCUPATION: retired   PLOF: Independent with basic ADLs   PATIENT GOALS  walk better with less pain and less SOB     OBJECTIVE: * Findings taken at [facility] unless otherwise noted.    DIAGNOSTIC FINDINGS: Hallux valgus deformity left foot.  Capsulitis with hammertoe deformity  second metatarsal phalangeal joint left.  Capsulitis of the subtalar joint right foot.   PATIENT SURVEYS:  FOTO 52  with predicted goal of: 56    03/05/22:  80 COGNITION:           Overall cognitive status: Within functional limits for tasks assessed                          SENSATION: WFL Ankles/feet swollen     POSTURE:  Knee hyper extension, bilateral valgus deformity knees   PALPATION: Minimally sensitive with deep pressure LB   LE ROM:   Active ROM Right 12/26/2021 Left 12/26/2021  Hip flexion 110 limited by pain wfl  Hip extension      Hip abduction      Hip adduction      Hip internal rotation      Hip external rotation      Knee flexion      Knee extension      Ankle dorsiflexion      Ankle  plantarflexion      Ankle inversion      Ankle eversion       (Blank rows = not tested)   LE MMT:   MMT Right 12/26/2021 Left 12/26/2021  Hip flexion 3+/5 limited to pain 4/5  Hip extension      Hip abduction 5 5  Hip adduction 5 5                Knee flexion 5 5  Knee extension 5 5   Ankle 5/5   Lumbar spine ROM: flexion 10% limited to pain                                  Side bending R/L 75%                                             Left rotation 25% limited to pain/Right 50%   FUNCTIONAL TESTS:  5 times sit to stand: 22   02/14/22 18s;  03/05/22 10.25s Timed up and go (TUG): 20  02/14/22 13;  03/05/22  9.11s 3 minute walk test O2 sats drop to 85% 31f  02/14/22  475 ft  O2 Sats 92%;   GAIT: Distance walked: 300 Assistive device utilized: None Level of assistance: Modified independence requires frequent rest periods Comments: cadence slowed, decreased arm swing, guarded       TODAY'S TREATMENT:   TUG /  STS /FOTO  Pt seen for aquatic therapy today.  Treatment took place in water 3.25-[facility] Temp of water was 92.  Pt entered/exited the pool via stairs step to independently with bilat rail.   Walking forward, back and side stepping multiple widths Side stepping with arms abdct/ add with rainbow hand buoys Forward/ backward marching holding Yellow hand buoys under water at sides Monster walk forward/ backward with coordinating arms Holding wall:  Hip flex/ext leg swings x 10 each LE Holding yellow noodle: hip abdct/add x 10; stork stance with hip IR/ER x 5 each LE (challenging today); squat x 10 Forward walking for rest and recovery  Pt requires buoyancy for support and to offload joints with strengthening exercises. Viscosity of the water is needed for resistance  of strengthening; water current perturbations provides challenge to standing balance unsupported, requiring increased core activation.    PATIENT EDUCATION:   Education details: management of condition; exercise progression Person educated: Patient Education method: Explanation Education comprehension: verbalized understanding     HOME EXERCISE PROGRAM: [id number] from previous episode Pt instructed to walk daily x 10 minutes resting as needed.   ASSESSMENT:   CLINICAL IMPRESSION: Pt able to complete STS and TUG in less time; has met these goals.  She reports continued stiffness in Lt hip during session.  No increase in Lt hip pain/ LBP during session.  She is making good gains towards all remaining goals.   OBJECTIVE IMPAIRMENTS cardiopulmonary status limiting activity, decreased activity tolerance, decreased endurance, decreased mobility, difficulty walking, and decreased strength.    ACTIVITY LIMITATIONS cleaning, community activity, meal prep, shopping, and church.    PERSONAL FACTORS Age, Fitness, and 3+ comorbidities: asthma, fibromyalgia, HTN  and gout are also affecting patient's functional outcome.      REHAB POTENTIAL: Good   CLINICAL DECISION MAKING: Evolving/moderate complexity   EVALUATION COMPLEXITY: Moderate     GOALS: Goals reviewed with patient? Yes   SHORT TERM GOALS: Target date: 01/23/2022   Pt will walk from parking lot to pool without rest period Baseline:  Goal status:  ACHIEVED   2.  Pt to be indep and complaint with land based HEP Baseline: stopped completing Goal status: PARTIALLY MET;  is only walking (for land HEP)   3.  Pt will decrease max LBP to <4/10 Baseline: 5/10 - 02/05/22 Goal status: ACHIEVED   4.  Pt will improve on 5x STS test to under 18s to demonstrate improved strength and decrease pain Baseline: 22s,  18s Goal status: ACHIEVED       LONG TERM GOALS: Target date: 02/20/2022   Pt will amb distance from bus stop to pool with no more than 2 rest periods or (added 02/14/22) from home up hill to bus stop. Baseline: 2-4 rest periods Goal status: ONGOING   2.  FOTO to meet stated  goal Baseline: 52;  46 on 03/05/22 Goal status: ONGOING   3.  Pt will improve on 3 minute walk test to >=650 ft Baseline: 32f ,   [id number] status: ONGOING   4.  Pt will improve on TUG test to <=12s to demonstrate improved mobility and balance Timed up and go (TUG): 20 Baseline: 20s,   13s; 9.11 Goal status: ACHIEVED - 03/05/22         PLAN: PT FREQUENCY: 1-2x/week   PT DURATION: 8 weeks   PLANNED INTERVENTIONS: Therapeutic exercises, Therapeutic activity, Neuromuscular re-education, Balance training, Gait training, Patient/Family education, Joint mobilization, Stair training, Aquatic Therapy, Taping, and Manual therapy   PLAN FOR NEXT SESSION: aquatics for core and LE strengthening, aerobic capacity retraining, Balance and stretching.  Lt hip strengthening and SLS.  Assess readiness to hold vs d/c vs additional visits. Touch base about land based HEP.   (Check end of session time for accuracy - not pulling from flow sheet).     JeKerin PernaPTA 03/05/22 12:34 PM

## 2022-03-07 ENCOUNTER — Encounter (HOSPITAL_BASED_OUTPATIENT_CLINIC_OR_DEPARTMENT_OTHER): Payer: Self-pay | Admitting: Physical Therapy

## 2022-03-07 ENCOUNTER — Ambulatory Visit (HOSPITAL_BASED_OUTPATIENT_CLINIC_OR_DEPARTMENT_OTHER): Payer: Medicare Other | Attending: Physical Medicine & Rehabilitation | Admitting: Physical Therapy

## 2022-03-07 DIAGNOSIS — M5459 Other low back pain: Secondary | ICD-10-CM | POA: Diagnosis present

## 2022-03-07 DIAGNOSIS — M6281 Muscle weakness (generalized): Secondary | ICD-10-CM | POA: Diagnosis present

## 2022-03-07 DIAGNOSIS — R262 Difficulty in walking, not elsewhere classified: Secondary | ICD-10-CM | POA: Insufficient documentation

## 2022-03-07 NOTE — Therapy (Addendum)
OUTPATIENT PHYSICAL THERAPY TREATMENT NOTE     Patient Name: Dawn Lucas MRN: [id number] DOB:[birth year], 66 y.o., female Today's Date: 03/07/2022  PCP: Sonia Side., FNP REFERRING PROVIDER: Sonia Side., FNP  END OF SESSION:   PT End of Session -    Visit Number 16   Number of Visits 26   Date for PT Re-Evaluation 04/11/22    Authorization Type MCR/ medicaid    Progress Note Due on Visit 20   PT Start Time 1205   PT Stop Time 1245   PT Time Calculation (min) 43 min    Activity Tolerance Patient tolerated treatment well    Behavior During Therapy WFL for tasks assessed/performed             Past Medical History:  Diagnosis Date   Anemia    Arthritis    hands, knees   Asthma    Back pain    Fibromyalgia    Headache    Hyperlipidemia    Hypertension 11/04/2011   Poor dental hygiene    missing and chipped teeth   Sleep apnea    Uses CPAP nightly   SVD (spontaneous vaginal delivery)    x 1   UTI (lower urinary tract infection)    Past Surgical History:  Procedure Laterality Date   CHOLECYSTECTOMY     COLON SURGERY     DILATION AND CURETTAGE OF UTERUS     MAB x 4   HYSTEROSCOPY WITH D & C N/A 02/23/2018   Procedure: DILATATION AND CURETTAGE /HYSTEROSCOPY;  Surgeon: Mora Bellman, MD;  Location: [facility];  Service: Gynecology;  Laterality: N/A;   OVARIAN CYST REMOVAL     Patient Active Problem List   Diagnosis Date Noted   Family history of malignant neoplasm of gastrointestinal tract 12/20/2021   Personal history of colonic polyps 12/20/2021   Thickened endometrium    Prediabetes 04/28/2015   Pes planus of both feet 03/28/2015   Seborrheic keratoses 08/22/2014   Headache(784.0) 05/17/2014   Lower extremity edema 09/22/2013   Vitamin D deficiency 09/22/2013   Tendinitis of left rotator cuff 06/17/2013   Right ankle pain 06/17/2013   Colon cancer screening 06/17/2013   Knee pain, bilateral 03/20/2012   Hypertension 11/04/2011   Generalized  pain 10/15/2011   Back pain 02/08/2011   Obese 02/08/2011   Tobacco abuse 02/08/2011   Sleep disorder 02/08/2011    REFERRING DIAG:  R53.81 (ICD-10-CM) - Debility    THERAPY DIAG:  Muscle weakness (generalized)  Difficulty in walking, not elsewhere classified  Discogenic lumbar pain  PERTINENT HISTORY: Debility due to covid Fibromyalgia syndrome with widespread body pain Ankle edema Gout Asthma HTN  PRECAUTIONS: none  SUBJECTIVE:  "Pain low feel pretty good".  PAIN:  Are you having pain? yes NPRS scale: 1/10  Pain location: L hip Pain description: sore Aggravating factors: walking, standing Relieving factors: sitting, resting   PRECAUTIONS: None   WEIGHT BEARING RESTRICTIONS No   FALLS:  Has patient fallen in last 6 months? No, Number of falls: 0   LIVING ENVIRONMENT: Lives with: lives alone Lives in: House/apartment Stairs: No;  Has following equipment at home: None   OCCUPATION: retired   PLOF: Independent with basic ADLs   PATIENT GOALS  walk better with less pain and less SOB     OBJECTIVE: * Findings taken at [facility] unless otherwise noted.    DIAGNOSTIC FINDINGS: Hallux valgus deformity left foot.  Capsulitis with hammertoe deformity second metatarsal phalangeal  joint left.  Capsulitis of the subtalar joint right foot.   PATIENT SURVEYS:  FOTO 52  with predicted goal of: 56    03/05/22:  26 COGNITION:           Overall cognitive status: Within functional limits for tasks assessed                          SENSATION: WFL Ankles/feet swollen     POSTURE:  Knee hyper extension, bilateral valgus deformity knees   PALPATION: Minimally sensitive with deep pressure LB   LE ROM:   Active ROM Right 12/26/2021 Left 12/26/2021  Hip flexion 110 limited by pain wfl  Hip extension      Hip abduction      Hip adduction      Hip internal rotation      Hip external rotation      Knee flexion      Knee extension      Ankle dorsiflexion      Ankle  plantarflexion      Ankle inversion      Ankle eversion       (Blank rows = not tested)   LE MMT:   MMT Right 12/26/2021 Left 12/26/2021  Hip flexion 3+/5 limited to pain 4/5  Hip extension      Hip abduction 5 5  Hip adduction 5 5                Knee flexion 5 5  Knee extension 5 5   Ankle 5/5   Lumbar spine ROM: flexion 10% limited to pain                                  Side bending R/L 75%                                             Left rotation 25% limited to pain/Right 50%   FUNCTIONAL TESTS:  5 times sit to stand: 22   02/14/22 18s;  03/05/22 10.25s Timed up and go (TUG): 20  02/14/22 13;  03/05/22  9.11s 3 minute walk test O2 sats drop to 85% 350f  02/14/22  475 ft  O2 Sats 92%;   GAIT: Distance walked: 300 Assistive device utilized: None Level of assistance: Modified independence requires frequent rest periods Comments: cadence slowed, decreased arm swing, guarded       TODAY'S TREATMENT:    Pt seen for aquatic therapy today.  Treatment took place in water 3.25-4 ft [facility] Temp of water was 92.  Pt entered/exited the pool via stairs step to independently with bilat rail.   Walking forward, back and side stepping multiple widths Side stepping with arms abdct/ add with rainbow hand buoys x 4 widths  Holding yellow noodle: hip abdct/add x 10; stork stance with hip IR/ER x 5 each LE (challenging today); squat 2x 10.  Hip flex/ext leg swings x 10 each LE (added balance challenge with noodle). Pt completes with some difficulty but successfully, minor LOB which she recovers indep  Monster walk forward/ backward with coordinating arms x 4 widths Holding to wall: hurdles R/L x10 Verbal cues and demonstration Forward walking for rest and recovery  Pt requires buoyancy for support  and to offload joints with strengthening exercises. Viscosity of the water is needed for resistance of strengthening; water current perturbations provides  challenge to standing balance unsupported, requiring increased core activation.    PATIENT EDUCATION:  Education details: management of condition; exercise progression Person educated: Patient Education method: Explanation Education comprehension: verbalized understanding     HOME EXERCISE PROGRAM: [id number] from previous episode Pt instructed to walk daily x 10 minutes resting as needed.   ASSESSMENT:   CLINICAL IMPRESSION: Pt tolerates increased balance challenge well.  Minor episodes of slight LOB which she recovers indep. Overall pain greatly reduced in LB since onset of therapy. She reports she has not recently needed to use bus so hasn't walked to stop.  She does report no seated rest period needed from parking lot to pool (500 ft). Progressing well towards stated goals.  OBJECTIVE IMPAIRMENTS cardiopulmonary status limiting activity, decreased activity tolerance, decreased endurance, decreased mobility, difficulty walking, and decreased strength.    ACTIVITY LIMITATIONS cleaning, community activity, meal prep, shopping, and church.    PERSONAL FACTORS Age, Fitness, and 3+ comorbidities: asthma, fibromyalgia, HTN  and gout are also affecting patient's functional outcome.      REHAB POTENTIAL: Good   CLINICAL DECISION MAKING: Evolving/moderate complexity   EVALUATION COMPLEXITY: Moderate     GOALS: Goals reviewed with patient? Yes   SHORT TERM GOALS: Target date: 01/23/2022   Pt will walk from parking lot to pool without rest period Baseline:  Goal status:  ACHIEVED   2.  Pt to be indep and complaint with land based HEP Baseline: stopped completing Goal status: PARTIALLY MET;  is only walking (for land HEP)   3.  Pt will decrease max LBP to <4/10 Baseline: 5/10 - 02/05/22 Goal status: ACHIEVED   4.  Pt will improve on 5x STS test to under 18s to demonstrate improved strength and decrease pain Baseline: 22s,  18s Goal status: ACHIEVED       LONG TERM GOALS:  Target date: 02/20/2022   Pt will amb distance from bus stop to pool with no more than 2 rest periods or (added 02/14/22) from home up hill to bus stop. Baseline: 2-4 rest periods Goal status: ONGOING   2.  FOTO to meet stated goal Baseline: 52;  46 on 03/05/22 Goal status: ONGOING   3.  Pt will improve on 3 minute walk test to >=650 ft Baseline: 339f ,   [id number] status: ONGOING   4.  Pt will improve on TUG test to <=12s to demonstrate improved mobility and balance Timed up and go (TUG): 20 Baseline: 20s,   13s; 9.11 Goal status: ACHIEVED - 03/05/22         PLAN: PT FREQUENCY: 1-2x/week   PT DURATION: 8 weeks   PLANNED INTERVENTIONS: Therapeutic exercises, Therapeutic activity, Neuromuscular re-education, Balance training, Gait training, Patient/Family education, Joint mobilization, Stair training, Aquatic Therapy, Taping, and Manual therapy   PLAN FOR NEXT SESSION: aquatics for core and LE strengthening, aerobic capacity retraining, Balance and stretching.  Lt hip strengthening and SLS.  Assess readiness to hold vs d/c vs additional visits. Touch base about land based HEP.   (Check end of session time for accuracy - not pulling from flow sheet).     Dawn Lucas MPT 03/07/22 1:04 PM  Addended 03/21/22 MaAnnamarie MajorZiCommercePT

## 2022-03-21 ENCOUNTER — Encounter (HOSPITAL_BASED_OUTPATIENT_CLINIC_OR_DEPARTMENT_OTHER): Payer: Self-pay | Admitting: Physical Therapy

## 2022-03-21 ENCOUNTER — Ambulatory Visit (HOSPITAL_BASED_OUTPATIENT_CLINIC_OR_DEPARTMENT_OTHER): Payer: Medicare Other | Admitting: Physical Therapy

## 2022-03-21 DIAGNOSIS — M5459 Other low back pain: Secondary | ICD-10-CM

## 2022-03-21 DIAGNOSIS — R262 Difficulty in walking, not elsewhere classified: Secondary | ICD-10-CM

## 2022-03-21 DIAGNOSIS — M6281 Muscle weakness (generalized): Secondary | ICD-10-CM | POA: Diagnosis not present

## 2022-03-21 DIAGNOSIS — M5136 Other intervertebral disc degeneration, lumbar region with discogenic back pain only: Secondary | ICD-10-CM

## 2022-03-21 NOTE — Therapy (Signed)
OUTPATIENT PHYSICAL THERAPY TREATMENT NOTE     Patient Name: JACLENE BARTELT MRN: [id number] DOB:1955-11-01, 66 y.o., female Today's Date: 03/21/2022  PCP: Sonia Side., FNP REFERRING PROVIDER: Sonia Side., FNP  END OF SESSION:   PT End of Session -    Visit Number 17   Number of Visits 26   Date for PT Re-Evaluation 04/11/22    Authorization Type MCR/ medicaid    Progress Note Due on Visit 20   PT Start Time 907   PT Stop Time 945   PT Time Calculation (min) 38 min    Activity Tolerance Patient tolerated treatment well    Behavior During Therapy WFL for tasks assessed/performed             Past Medical History:  Diagnosis Date   Anemia    Arthritis    hands, knees   Asthma    Back pain    Fibromyalgia    Headache    Hyperlipidemia    Hypertension 11/04/2011   Poor dental hygiene    missing and chipped teeth   Sleep apnea    Uses CPAP nightly   SVD (spontaneous vaginal delivery)    x 1   UTI (lower urinary tract infection)    Past Surgical History:  Procedure Laterality Date   CHOLECYSTECTOMY     COLON SURGERY     DILATION AND CURETTAGE OF UTERUS     MAB x 4   HYSTEROSCOPY WITH D & C N/A 02/23/2018   Procedure: DILATATION AND CURETTAGE /HYSTEROSCOPY;  Surgeon: Mora Bellman, MD;  Location: [facility];  Service: Gynecology;  Laterality: N/A;   OVARIAN CYST REMOVAL     Patient Active Problem List   Diagnosis Date Noted   Family history of malignant neoplasm of gastrointestinal tract 12/20/2021   Personal history of colonic polyps 12/20/2021   Thickened endometrium    Prediabetes 04/28/2015   Pes planus of both feet 03/28/2015   Seborrheic keratoses 08/22/2014   Headache(784.0) 05/17/2014   Lower extremity edema 09/22/2013   Vitamin D deficiency 09/22/2013   Tendinitis of left rotator cuff 06/17/2013   Right ankle pain 06/17/2013   Colon cancer screening 06/17/2013   Knee pain, bilateral 03/20/2012   Hypertension 11/04/2011   Generalized  pain 10/15/2011   Back pain 02/08/2011   Obese 02/08/2011   Tobacco abuse 02/08/2011   Sleep disorder 02/08/2011    REFERRING DIAG:  R53.81 (ICD-10-CM) - Debility    THERAPY DIAG:  Muscle weakness (generalized)  Difficulty in walking, not elsewhere classified  Discogenic lumbar pain  PERTINENT HISTORY: Debility due to covid Fibromyalgia syndrome with widespread body pain Ankle edema Gout Asthma HTN  PRECAUTIONS: none  SUBJECTIVE:  "My back has been hurting but I have been busy".  PAIN:  Are you having pain? yes NPRS scale: 4/10  Pain location: L hip Pain description: sore Aggravating factors: walking, standing Relieving factors: sitting, resting   PRECAUTIONS: None   WEIGHT BEARING RESTRICTIONS No   FALLS:  Has patient fallen in last 6 months? No, Number of falls: 0   LIVING ENVIRONMENT: Lives with: lives alone Lives in: House/apartment Stairs: No;  Has following equipment at home: None   OCCUPATION: retired   PLOF: Independent with basic ADLs   PATIENT GOALS  walk better with less pain and less SOB     OBJECTIVE: * Findings taken at [facility] unless otherwise noted.    DIAGNOSTIC FINDINGS: Hallux valgus deformity left foot.  Capsulitis with  hammertoe deformity second metatarsal phalangeal joint left.  Capsulitis of the subtalar joint right foot.   PATIENT SURVEYS:  FOTO 52  with predicted goal of: 56    03/05/22:  8 COGNITION:           Overall cognitive status: Within functional limits for tasks assessed                          SENSATION: WFL Ankles/feet swollen     POSTURE:  Knee hyper extension, bilateral valgus deformity knees   PALPATION: Minimally sensitive with deep pressure LB   LE ROM:   Active ROM Right 12/26/2021 Left 12/26/2021  Hip flexion 110 limited by pain wfl  Hip extension      Hip abduction      Hip adduction      Hip internal rotation      Hip external rotation      Knee flexion      Knee extension      Ankle  dorsiflexion      Ankle plantarflexion      Ankle inversion      Ankle eversion       (Blank rows = not tested)   LE MMT:   MMT Right 12/26/2021 Left 12/26/2021  Hip flexion 3+/5 limited to pain 4/5  Hip extension      Hip abduction 5 5  Hip adduction 5 5                Knee flexion 5 5  Knee extension 5 5   Ankle 5/5   Lumbar spine ROM: flexion 10% limited to pain                                  Side bending R/L 75%                                             Left rotation 25% limited to pain/Right 50%   FUNCTIONAL TESTS:  5 times sit to stand: 22   02/14/22 18s;  03/05/22 10.25s Timed up and go (TUG): 20  02/14/22 13;  03/05/22  9.11s 3 minute walk test O2 sats drop to 85% 361f  02/14/22  475 ft  O2 Sats 92%;   GAIT: Distance walked: 300 Assistive device utilized: None Level of assistance: Modified independence requires frequent rest periods Comments: cadence slowed, decreased arm swing, guarded       TODAY'S TREATMENT:    Pt seen for aquatic therapy today.  Treatment took place in water 3.25-4 ft [facility] Temp of water was 92.  Pt entered/exited the pool via stairs step to independently with bilat rail.   Walking forward, back and side stepping multiple widths Side stepping with arms abdct/ add with rainbow hand buoys x 4 widths  Holding yellow noodle: hip abdct/add x 10; stork stance with hip IR/ER x 5 each LE (challenging today); squat 2x 10.  Hip flex/ext leg swings x 5 each LE. Pt completes with  minor LOB which she recovers indep  Noodle pull down for core engagement x10 Monster walk forward/ backward with coordinating arms x 2 widths  hurdles R/L x10 Verbal cues and demonstration Forward walking for rest and recovery  Pt requires buoyancy  for support and to offload joints with strengthening exercises. Viscosity of the water is needed for resistance of strengthening; water current perturbations provides challenge to standing  balance unsupported, requiring increased core activation.    PATIENT EDUCATION:  Education details: management of condition; exercise progression Person educated: Patient Education method: Explanation Education comprehension: verbalized understanding     HOME EXERCISE PROGRAM: [id number] from previous episode Pt instructed to walk daily x 10 minutes resting as needed.   ASSESSMENT:   CLINICAL IMPRESSION: Pt has walked to and from bus stop this morning for therapy. She reports 3 rest periods from home to bus stop due to increased lb discomfort from busy week.  She completes session well. RLE difficulty completing hurdles gaining the external hip rotation. Once coordinated movement increases lb discomfort. She has had ~ 2 week hiatus in aquatic therapy due to scheduling issues. Will make better effort to schedule for maximal benefit.   OBJECTIVE IMPAIRMENTS cardiopulmonary status limiting activity, decreased activity tolerance, decreased endurance, decreased mobility, difficulty walking, and decreased strength.    ACTIVITY LIMITATIONS cleaning, community activity, meal prep, shopping, and church.    PERSONAL FACTORS Age, Fitness, and 3+ comorbidities: asthma, fibromyalgia, HTN  and gout are also affecting patient's functional outcome.      REHAB POTENTIAL: Good   CLINICAL DECISION MAKING: Evolving/moderate complexity   EVALUATION COMPLEXITY: Moderate     GOALS: Goals reviewed with patient? Yes   SHORT TERM GOALS: Target date: 01/23/2022   Pt will walk from parking lot to pool without rest period Baseline:  Goal status:  ACHIEVED   2.  Pt to be indep and complaint with land based HEP Baseline: stopped completing Goal status: PARTIALLY MET;  is only walking (for land HEP)   3.  Pt will decrease max LBP to <4/10 Baseline: 5/10 - 02/05/22 Goal status: ACHIEVED   4.  Pt will improve on 5x STS test to under 18s to demonstrate improved strength and decrease pain Baseline: 22s,   18s Goal status: ACHIEVED       LONG TERM GOALS: Target date: 02/20/2022   Pt will amb distance from bus stop to pool with no more than 2 rest periods or (added 02/14/22) from home up hill to bus stop. Baseline: 2-4 rest periods Goal status: ONGOING   2.  FOTO to meet stated goal Baseline: 52;  46 on 03/05/22 Goal status: ONGOING   3.  Pt will improve on 3 minute walk test to >=650 ft Baseline: 379f ,   4790fGoal status: ONGOING   4.  Pt will improve on TUG test to <=12s to demonstrate improved mobility and balance Timed up and go (TUG): 20 Baseline: 20s,   13s; 9.11 Goal status: ACHIEVED - 03/05/22         PLAN: PT FREQUENCY: 1-2x/week   PT DURATION: 8 weeks   PLANNED INTERVENTIONS: Therapeutic exercises, Therapeutic activity, Neuromuscular re-education, Balance training, Gait training, Patient/Family education, Joint mobilization, Stair training, Aquatic Therapy, Taping, and Manual therapy   PLAN FOR NEXT SESSION: aquatics for core and LE strengthening, aerobic capacity retraining, Balance and stretching.  Lt hip strengthening and SLS.  Assess readiness to hold vs d/c vs additional visits. Touch base about land based HEP.   (Check end of session time for accuracy - not pulling from flow sheet).     MaStanton KidneyFrankie) Logan Vegh MPT 03/21/22 1:28 PM

## 2022-03-28 ENCOUNTER — Ambulatory Visit (HOSPITAL_BASED_OUTPATIENT_CLINIC_OR_DEPARTMENT_OTHER): Payer: Medicare Other | Admitting: Physical Therapy

## 2022-04-03 ENCOUNTER — Encounter (HOSPITAL_BASED_OUTPATIENT_CLINIC_OR_DEPARTMENT_OTHER): Payer: Self-pay | Admitting: Physical Therapy

## 2022-04-03 ENCOUNTER — Ambulatory Visit (HOSPITAL_BASED_OUTPATIENT_CLINIC_OR_DEPARTMENT_OTHER): Payer: Medicare Other | Admitting: Physical Therapy

## 2022-04-03 DIAGNOSIS — M6281 Muscle weakness (generalized): Secondary | ICD-10-CM

## 2022-04-03 DIAGNOSIS — R262 Difficulty in walking, not elsewhere classified: Secondary | ICD-10-CM

## 2022-04-03 DIAGNOSIS — M5459 Other low back pain: Secondary | ICD-10-CM

## 2022-04-03 NOTE — Therapy (Signed)
OUTPATIENT PHYSICAL THERAPY TREATMENT NOTE  PHYSICAL THERAPY DISCHARGE SUMMARY  Visits from Start of Care: 18  Current functional level related to goals / functional outcomes: Indep with ADL's and functional mobility with modifications for rests and safety   Remaining deficits: R hip discomfort   Education / Equipment: Management of condition;HEP   Patient agrees to discharge. Patient goals were partially met. Patient is being discharged due to being pleased with the current functional level.    Patient Name: Dawn Lucas MRN: [id number] DOB:11-03-1955, 66 y.o., female Today's Date: 04/03/2022  PCP: Sonia Side., FNP REFERRING PROVIDER: Sonia Side., FNP  END OF SESSION:   PT End of Session -    Visit Number 17   Number of Visits 26   Date for PT Re-Evaluation 04/11/22    Authorization Type MCR/ medicaid    Progress Note Due on Visit 20   PT Start Time 907   PT Stop Time 945   PT Time Calculation (min) 38 min    Activity Tolerance Patient tolerated treatment well    Behavior During Therapy WFL for tasks assessed/performed             Past Medical History:  Diagnosis Date   Anemia    Arthritis    hands, knees   Asthma    Back pain    Fibromyalgia    Headache    Hyperlipidemia    Hypertension 11/04/2011   Poor dental hygiene    missing and chipped teeth   Sleep apnea    Uses CPAP nightly   SVD (spontaneous vaginal delivery)    x 1   UTI (lower urinary tract infection)    Past Surgical History:  Procedure Laterality Date   CHOLECYSTECTOMY     COLON SURGERY     DILATION AND CURETTAGE OF UTERUS     MAB x 4   HYSTEROSCOPY WITH D & C N/A 02/23/2018   Procedure: DILATATION AND CURETTAGE /HYSTEROSCOPY;  Surgeon: Mora Bellman, MD;  Location: [facility];  Service: Gynecology;  Laterality: N/A;   OVARIAN CYST REMOVAL     Patient Active Problem List   Diagnosis Date Noted   Family history of malignant neoplasm of gastrointestinal tract  12/20/2021   Personal history of colonic polyps 12/20/2021   Thickened endometrium    Prediabetes 04/28/2015   Pes planus of both feet 03/28/2015   Seborrheic keratoses 08/22/2014   Headache(784.0) 05/17/2014   Lower extremity edema 09/22/2013   Vitamin D deficiency 09/22/2013   Tendinitis of left rotator cuff 06/17/2013   Right ankle pain 06/17/2013   Colon cancer screening 06/17/2013   Knee pain, bilateral 03/20/2012   Hypertension 11/04/2011   Generalized pain 10/15/2011   Back pain 02/08/2011   Obese 02/08/2011   Tobacco abuse 02/08/2011   Sleep disorder 02/08/2011    REFERRING DIAG:  R53.81 (ICD-10-CM) - Debility    THERAPY DIAG:  Muscle weakness (generalized)  Difficulty in walking, not elsewhere classified  Discogenic lumbar pain  PERTINENT HISTORY: Debility due to covid Fibromyalgia syndrome with widespread body pain Ankle edema Gout Asthma HTN  PRECAUTIONS: none  SUBJECTIVE:  "I'm not having pain in my back anymore that is better.I can walk further with fewer rests. My right foot is hurting more than anything" PAIN:  Are you having pain? yes NPRS scale: 2/10  Pain location: L hip Pain description: sore Aggravating factors: walking, standing Relieving factors: sitting, resting   PRECAUTIONS: None   WEIGHT BEARING RESTRICTIONS No  FALLS:  Has patient fallen in last 6 months? No, Number of falls: 0   LIVING ENVIRONMENT: Lives with: lives alone Lives in: House/apartment Stairs: No;  Has following equipment at home: None   OCCUPATION: retired   PLOF: Independent with basic ADLs   PATIENT GOALS  walk better with less pain and less SOB     OBJECTIVE: * Findings taken at [facility] unless otherwise noted.    DIAGNOSTIC FINDINGS: Hallux valgus deformity left foot.  Capsulitis with hammertoe deformity second metatarsal phalangeal joint left.  Capsulitis of the subtalar joint right foot.   PATIENT SURVEYS:  FOTO 52  with predicted goal of: 56     03/05/22:  19 COGNITION:           Overall cognitive status: Within functional limits for tasks assessed                          SENSATION: WFL Ankles/feet swollen     POSTURE:  Knee hyper extension, bilateral valgus deformity knees   PALPATION: Minimally sensitive with deep pressure LB   LE ROM:   Active ROM Right 12/26/2021 Left 12/26/2021  Hip flexion 110 limited by pain wfl  Hip extension      Hip abduction      Hip adduction      Hip internal rotation      Hip external rotation      Knee flexion      Knee extension      Ankle dorsiflexion      Ankle plantarflexion      Ankle inversion      Ankle eversion       (Blank rows = not tested)   LE MMT:   MMT Right 12/26/2021 Left 12/26/2021  Hip flexion 3+/5 limited to pain 4/5  Hip extension      Hip abduction 5 5  Hip adduction 5 5                Knee flexion 5 5  Knee extension 5 5   Ankle 5/5   Lumbar spine ROM: flexion 10% limited to pain                                  Side bending R/L 75%                                             Left rotation 25% limited to pain/Right 50%   FUNCTIONAL TESTS:  5 times sit to stand: 22   02/14/22 18s;  03/05/22 10.25s Timed up and go (TUG): 20  02/14/22 13;  03/05/22  9.11s 3 minute walk test O2 sats drop to 85% 380f  02/14/22  475 ft  O2 Sats 92%; 04/03/22 702 ft   GAIT: Distance walked: 300 Assistive device utilized: None Level of assistance: Modified independence requires frequent rest periods Comments: cadence slowed, decreased arm swing, guarded       TODAY'S TREATMENT:    Pt seen for aquatic therapy today.  Treatment took place in water 3.25-4 ft [facility] Temp of water was 92.  Pt entered/exited the pool via stairs step to independently with bilat rail.   Walking forward, back and side stepping multiple widths Side stepping with  arms abdct/ add with rainbow hand buoys x 4 widths  Holding yellow noodle: DF; hip  extension; hip flex; hip abdct/add x 10; stork stance with hip IR/ER x 5 each LE (challenging today); squat x 10.  Hip flex/ext leg swings x 5 each LE. Completed in 4 ft to decrease wb right ankle.  Cycling, skiing, add/abd straddling noodle holding to corner wall.  Pt requires buoyancy for support and to offload joints with strengthening exercises. Viscosity of the water is needed for resistance of strengthening; water current perturbations provides challenge to standing balance unsupported, requiring increased core activation.  Objective testing    PATIENT EDUCATION:  Education details: management of condition; exercise progression Person educated: Patient Education method: Explanation Education comprehension: verbalized understanding     HOME EXERCISE PROGRAM: [id number] from previous episode Pt instructed to walk daily x 10 minutes resting as needed.   ASSESSMENT:   CLINICAL IMPRESSION:  Pt has walked to and from bus stop this morning for therapy again stopping only 2x.  She has improved  toleration to walking with reports of decreased LBP. Objective testing completed due to mutual agreement for DC today. Pt takes bus to and from therapy which has become a hardship with long distance to walk to and from bus stops. All goals met except Foto. She is at her max potential at this time.  She is encouraged to continue with walking daily to keep up gains made here at therapy.    OBJECTIVE IMPAIRMENTS cardiopulmonary status limiting activity, decreased activity tolerance, decreased endurance, decreased mobility, difficulty walking, and decreased strength.    ACTIVITY LIMITATIONS cleaning, community activity, meal prep, shopping, and church.    PERSONAL FACTORS Age, Fitness, and 3+ comorbidities: asthma, fibromyalgia, HTN  and gout are also affecting patient's functional outcome.      REHAB POTENTIAL: Good   CLINICAL DECISION MAKING: Evolving/moderate complexity   EVALUATION  COMPLEXITY: Moderate     GOALS: Goals reviewed with patient? Yes   SHORT TERM GOALS: Target date: 01/23/2022   Pt will walk from parking lot to pool without rest period Baseline:  Goal status:  ACHIEVED   2.  Pt to be indep and complaint with land based HEP Baseline: stopped completing Goal status: Achieved   3.  Pt will decrease max LBP to <4/10 Baseline: 5/10 - 02/05/22 Goal status: ACHIEVED   4.  Pt will improve on 5x STS test to under 18s to demonstrate improved strength and decrease pain Baseline: 22s,  18s Goal status: ACHIEVED       LONG TERM GOALS: Target date: 02/20/2022   Pt will amb distance from bus stop to pool with no more than 2 rest periods or (added 02/14/22) from home up hill to bus stop. Baseline: 2-4 rest periods Goal status: Achieved   2.  FOTO to meet stated goal Baseline: 52;  46 on 03/05/22 Goal status: Not met   3.  Pt will improve on 3 minute walk test to >=650 ft Baseline: 312f ,   4725f  702 ft Goal status: Achieved   4.  Pt will improve on TUG test to <=12s to demonstrate improved mobility and balance Timed up and go (TUG): 20 Baseline: 20s,   13s; 9.11 Goal status: ACHIEVED - 03/05/22         PLAN: PT FREQUENCY: 1-2x/week   PT DURATION: 8 weeks   PLANNED INTERVENTIONS: Therapeutic exercises, Therapeutic activity, Neuromuscular re-education, Balance training, Gait training, Patient/Family education, Joint mobilization, Stair training, Aquatic Therapy, Taping, and  Manual therapy   PLAN FOR NEXT SESSION: Pt DC'ed   Stanton Kidney Tharon Aquas) Fumiye Lubben MPT 04/03/22 12:03 PM

## 2022-04-10 ENCOUNTER — Ambulatory Visit (HOSPITAL_BASED_OUTPATIENT_CLINIC_OR_DEPARTMENT_OTHER): Payer: Medicare Other | Admitting: Physical Therapy

## 2022-04-12 ENCOUNTER — Ambulatory Visit (HOSPITAL_BASED_OUTPATIENT_CLINIC_OR_DEPARTMENT_OTHER): Payer: Medicare Other | Admitting: Physical Therapy

## 2022-05-09 ENCOUNTER — Other Ambulatory Visit: Payer: Self-pay | Admitting: Family

## 2022-05-09 DIAGNOSIS — Z1231 Encounter for screening mammogram for malignant neoplasm of breast: Secondary | ICD-10-CM

## 2022-05-22 ENCOUNTER — Other Ambulatory Visit: Payer: Self-pay

## 2022-05-24 MED ORDER — PREGABALIN 200 MG PO CAPS: 200.0000 mg | ORAL_CAPSULE | Freq: Two times a day (BID) | ORAL | 5 refills | Status: DC

## 2022-05-29 ENCOUNTER — Ambulatory Visit: Payer: Medicare Other

## 2022-05-29 ENCOUNTER — Ambulatory Visit
Admission: RE | Admit: 2022-05-29 | Discharge: 2022-05-29 | Disposition: A | Discharge status: Home / Self Care | Payer: Medicare Other | Source: Ambulatory Visit | Attending: Family | Admitting: Family

## 2022-05-29 DIAGNOSIS — Z1231 Encounter for screening mammogram for malignant neoplasm of breast: Secondary | ICD-10-CM

## 2022-06-08 IMAGING — DX DG CHEST 1V PORT
1 series · 1 of 1 positions shown · non-contrast
Comparison: 09/08/2020 chest radiograph.

CLINICAL DATA: Dyspnea, COVID positive

EXAM:
PORTABLE CHEST 1 VIEW

[chest ap]
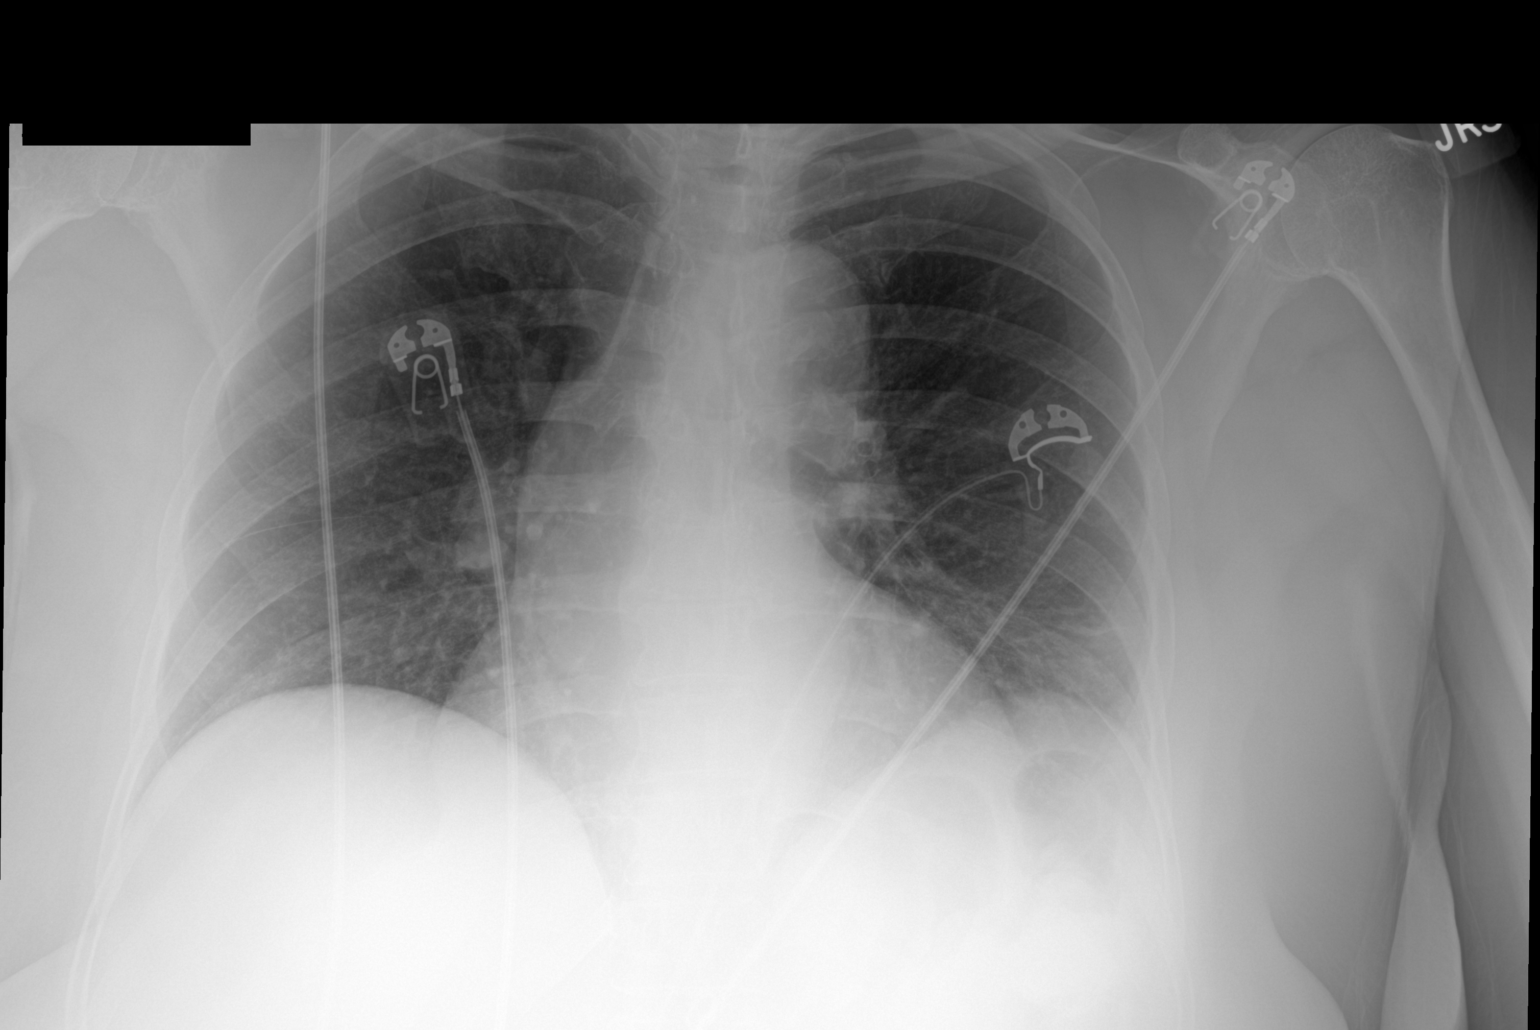

[1 of 1 positions shown; findings below may reference images not displayed]

FINDINGS: Stable cardiomediastinal silhouette with normal heart size. No
pneumothorax. No pleural effusion. Lungs appear clear, with no acute
consolidative airspace disease and no pulmonary edema.
IMPRESSION: No active disease.

## 2022-06-11 ENCOUNTER — Encounter: Payer: Medicare Other | Attending: Physical Medicine & Rehabilitation | Admitting: Physical Medicine & Rehabilitation

## 2022-06-15 IMAGING — MG MM DIGITAL SCREENING BILAT W/ TOMO AND CAD
6 of 10 series · 6 of 30 positions shown · non-contrast
Comparison: Previous exam(s).

CLINICAL DATA: Screening.

EXAM:
DIGITAL SCREENING BILATERAL MAMMOGRAM WITH TOMOSYNTHESIS AND CAD
TECHNIQUE: Bilateral screening digital craniocaudal and mediolateral oblique
mammograms were obtained. Bilateral screening digital breast
tomosynthesis was performed. The images were evaluated with
computer-aided detection.

[R MLO synth-2D (1 of 2)]
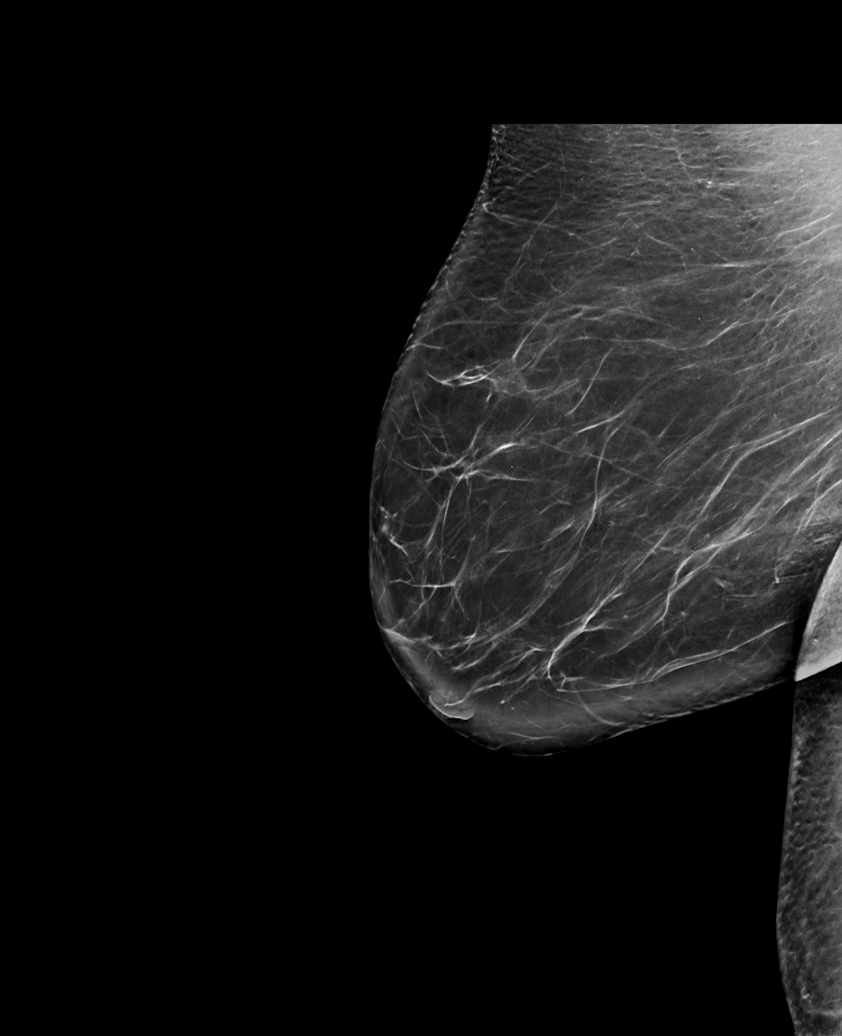

[R CC synth-2D]
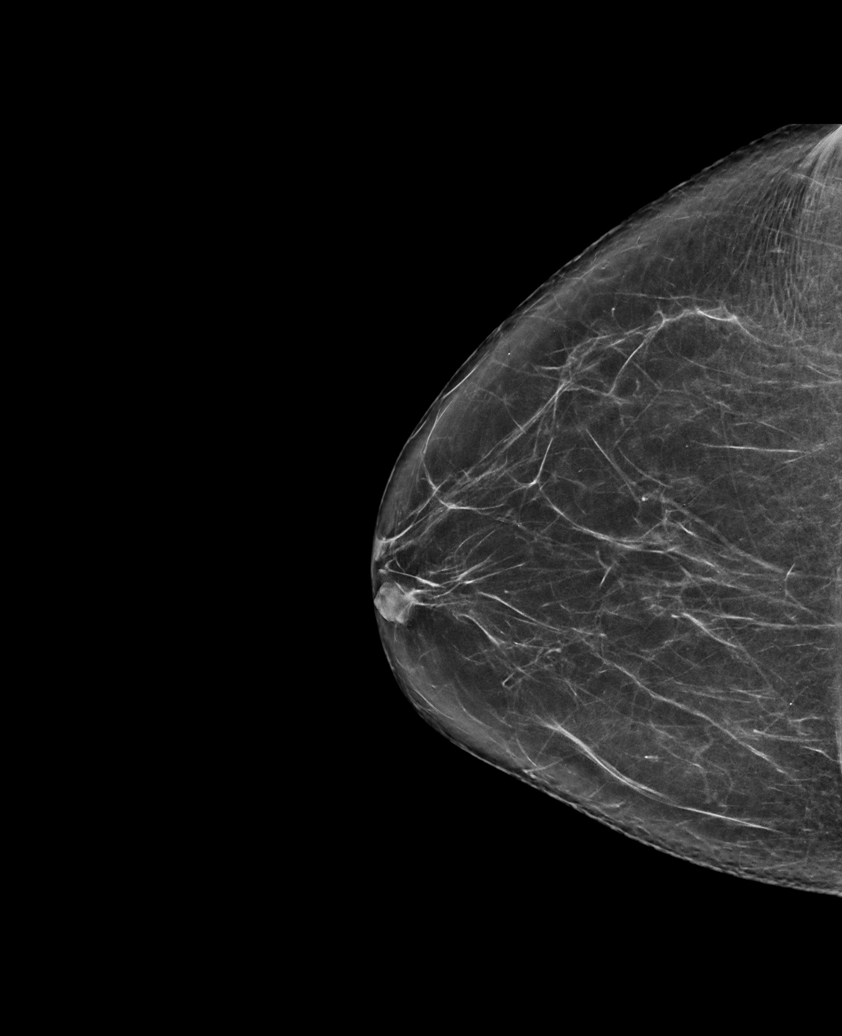

[R MLO synth-2D (2 of 2)]
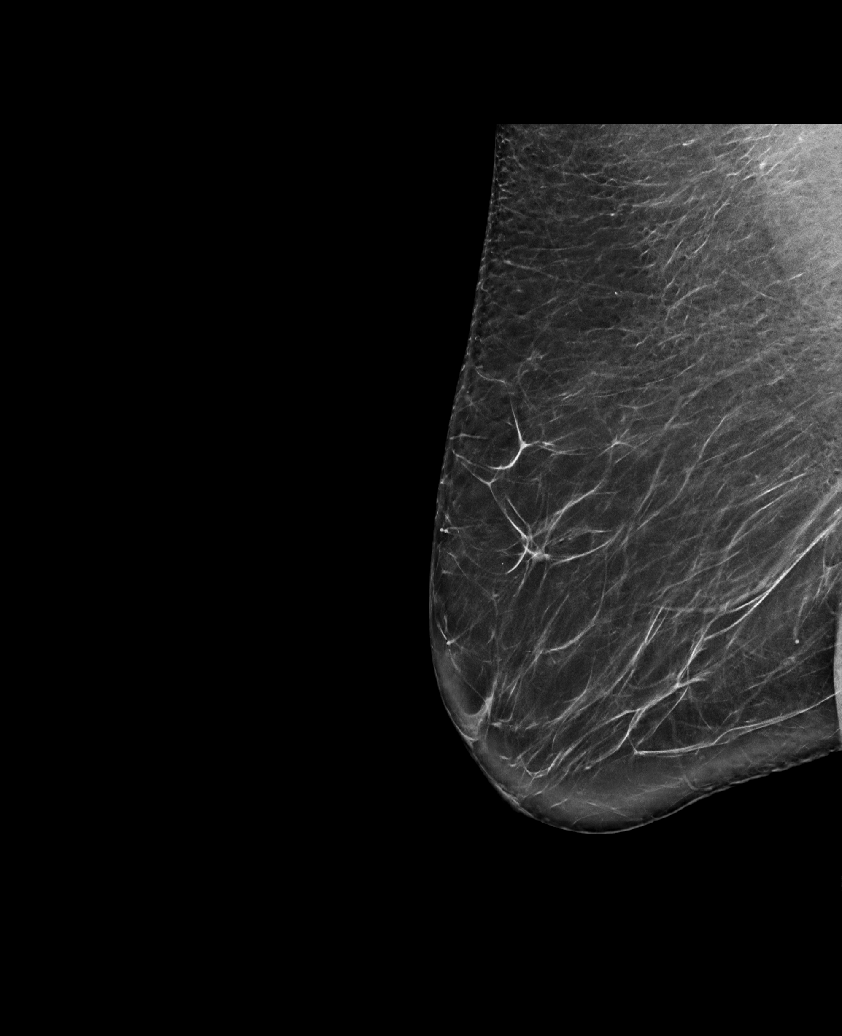

[L MLO synth-2D]
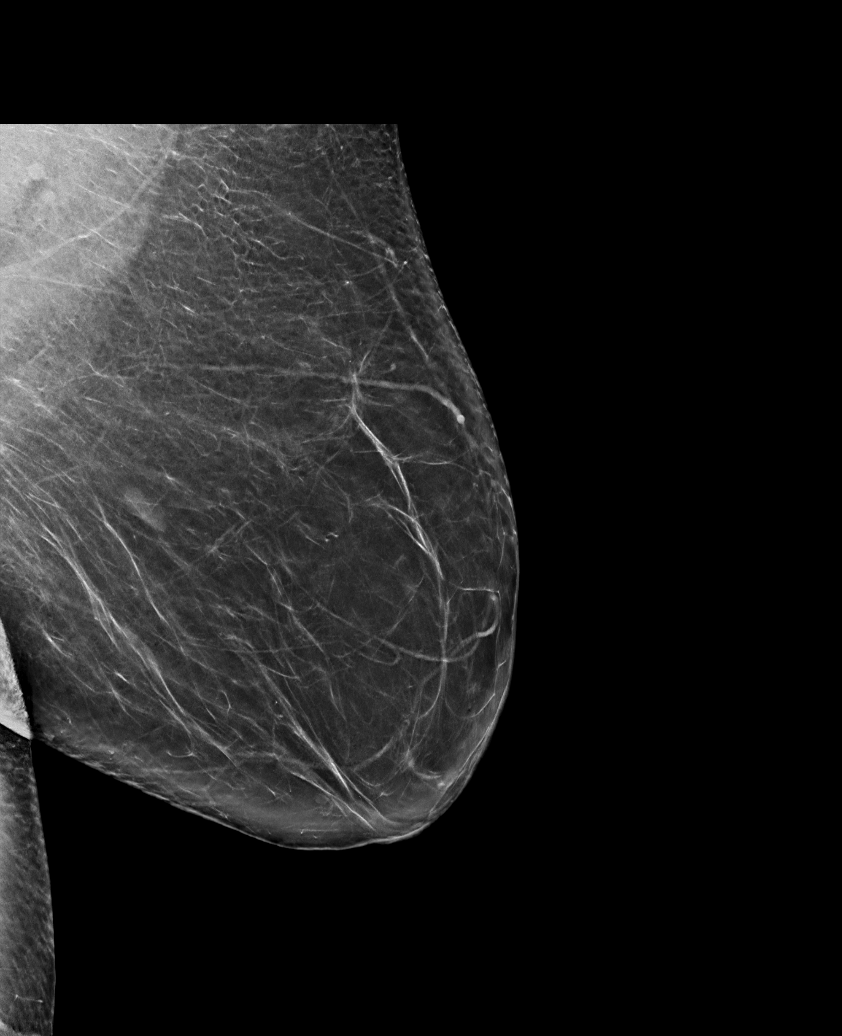

[L CC synth-2D]
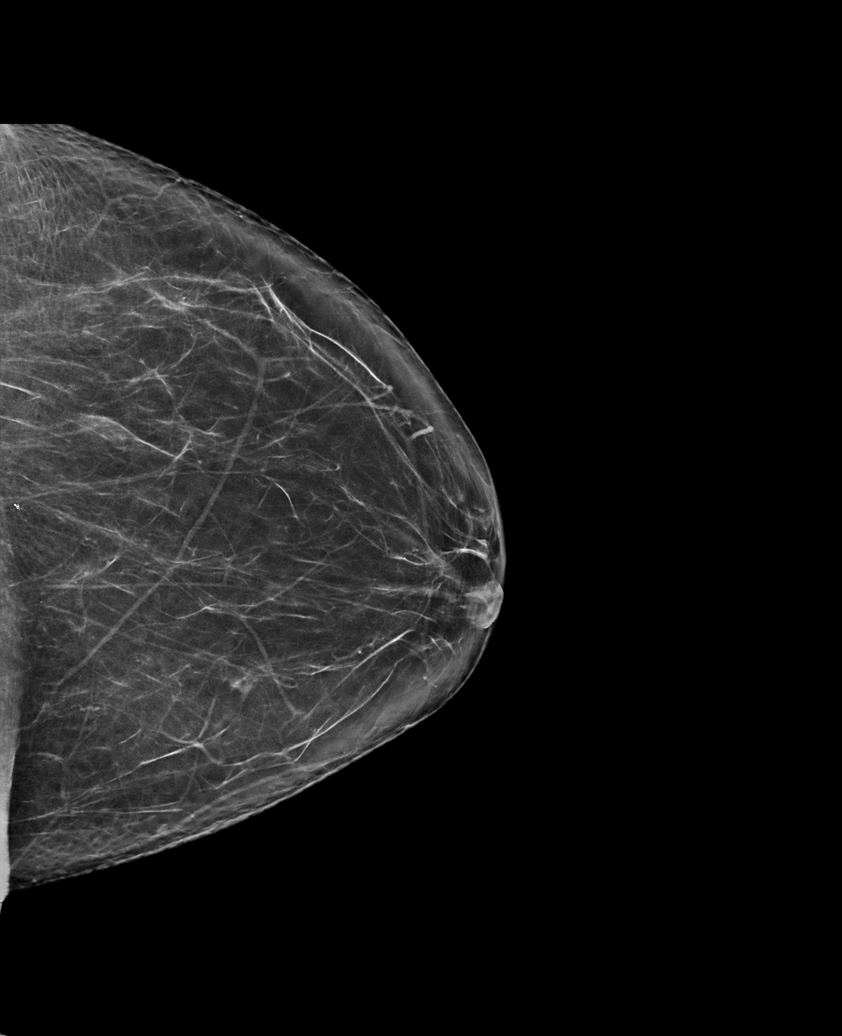

[R MLO tomo · tomo slice 43/86.0]
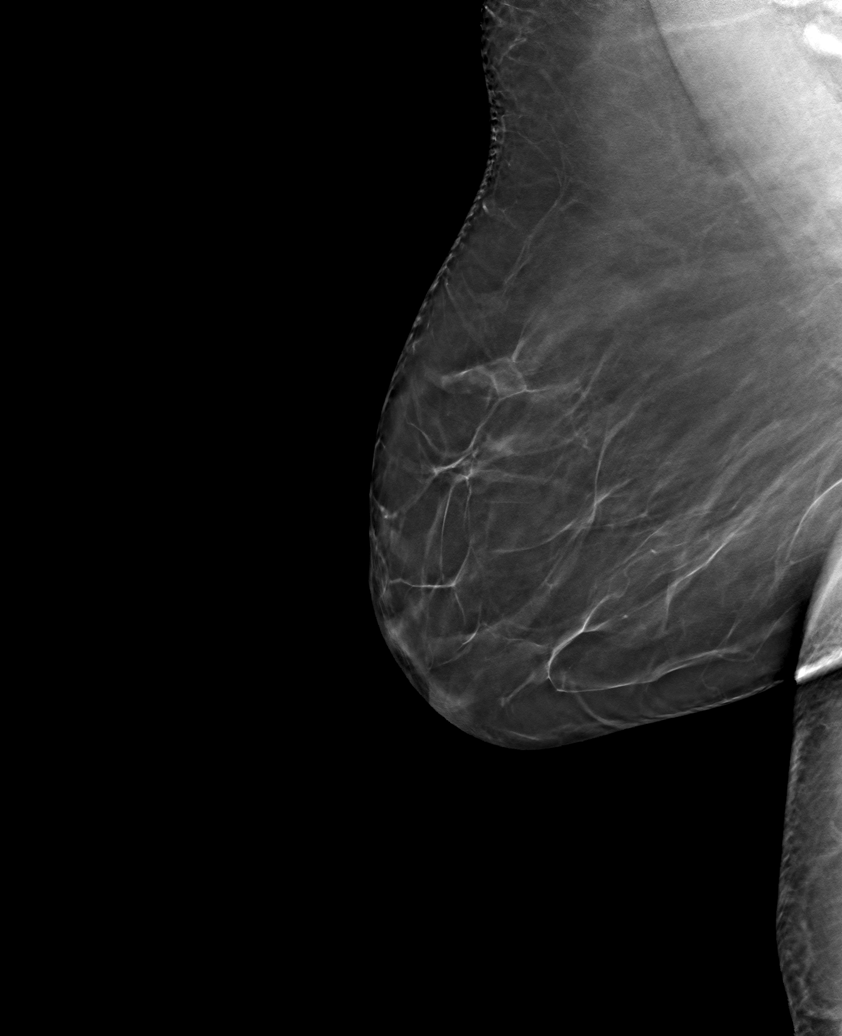

[6 of 30 positions shown; findings below may reference images not displayed]

ACR Breast Density Category b: There are scattered areas of
fibroglandular density.
FINDINGS: There are no findings suspicious for malignancy.
IMPRESSION: No mammographic evidence of malignancy. A result letter of this
screening mammogram will be mailed directly to the patient.

RECOMMENDATION:
Screening mammogram in one year. (Code:51-O-LD2)

BI-RADS CATEGORY  1: Negative.

## 2022-06-17 ENCOUNTER — Other Ambulatory Visit: Payer: Self-pay

## 2022-06-17 ENCOUNTER — Other Ambulatory Visit: Payer: Self-pay | Admitting: Family

## 2022-06-17 DIAGNOSIS — E2839 Other primary ovarian failure: Secondary | ICD-10-CM

## 2022-06-17 DIAGNOSIS — F17210 Nicotine dependence, cigarettes, uncomplicated: Secondary | ICD-10-CM

## 2022-07-09 ENCOUNTER — Ambulatory Visit
Admission: RE | Admit: 2022-07-09 | Discharge: 2022-07-09 | Disposition: A | Discharge status: Home / Self Care | Payer: Medicare Other | Source: Ambulatory Visit | Attending: Family | Admitting: Family

## 2022-07-09 DIAGNOSIS — F17210 Nicotine dependence, cigarettes, uncomplicated: Secondary | ICD-10-CM

## 2022-07-11 ENCOUNTER — Other Ambulatory Visit: Payer: Medicare Other

## 2022-07-23 ENCOUNTER — Other Ambulatory Visit: Payer: Medicare Other

## 2022-07-31 LAB — COLOGUARD: COLOGUARD: NEGATIVE

## 2022-07-31 LAB — EXTERNAL GENERIC LAB PROCEDURE: COLOGUARD: NEGATIVE

## 2022-08-06 ENCOUNTER — Other Ambulatory Visit: Payer: Medicare Other

## 2022-09-26 ENCOUNTER — Ambulatory Visit (INDEPENDENT_AMBULATORY_CARE_PROVIDER_SITE_OTHER): Payer: Medicare Other | Admitting: Podiatry

## 2022-09-26 ENCOUNTER — Encounter: Payer: Self-pay | Admitting: Podiatry

## 2022-09-26 DIAGNOSIS — M7752 Other enthesopathy of left foot: Secondary | ICD-10-CM | POA: Diagnosis not present

## 2022-09-26 DIAGNOSIS — M7751 Other enthesopathy of right foot: Secondary | ICD-10-CM

## 2022-09-26 DIAGNOSIS — M778 Other enthesopathies, not elsewhere classified: Secondary | ICD-10-CM

## 2022-09-26 MED ORDER — TRIAMCINOLONE ACETONIDE 40 MG/ML IJ SUSP
40.0000 mg | Freq: Once | INTRAMUSCULAR | Status: AC
  Administered 2022-09-26: 40 mg

## 2022-09-26 NOTE — Progress Notes (Signed)
He presents today chief complaint of pain to the second metatarsophalangeal joint area with numb toes she states that she does get some radiating pain at nighttime.  She says the ankle is doing much better.  General: Vital signs stable alert and oriented x 3.  Pulses are palpable.  She has pain on end range of motion on palpation of the second metatarsal and third metatarsal phalangeal joints of the bilateral foot right greater than left.  Assessment: Capsulitis second metatarsophalangeal joint.  Plan: Injected 10 mg Kenalog 5 mg Marcaine around the joint not injecting directly into the joint of sterile Betadine skin prep.  Tolerated procedure well without complications follow-up with her on an as-needed basis.

## 2022-09-27 ENCOUNTER — Other Ambulatory Visit: Payer: Self-pay | Admitting: Family

## 2022-09-27 DIAGNOSIS — E2839 Other primary ovarian failure: Secondary | ICD-10-CM

## 2022-10-08 ENCOUNTER — Ambulatory Visit
Admission: RE | Admit: 2022-10-08 | Discharge: 2022-10-08 | Disposition: A | Discharge status: Home / Self Care | Payer: Medicare Other | Source: Ambulatory Visit | Attending: Family | Admitting: Family

## 2022-10-08 DIAGNOSIS — E2839 Other primary ovarian failure: Secondary | ICD-10-CM

## 2022-11-13 ENCOUNTER — Ambulatory Visit
Admission: RE | Admit: 2022-11-13 | Discharge: 2022-11-13 | Disposition: A | Discharge status: Home / Self Care | Payer: 59 | Source: Ambulatory Visit | Attending: Family | Admitting: Family

## 2022-11-13 ENCOUNTER — Other Ambulatory Visit: Payer: Self-pay | Admitting: Family

## 2022-11-13 DIAGNOSIS — M549 Dorsalgia, unspecified: Secondary | ICD-10-CM

## 2023-04-29 ENCOUNTER — Other Ambulatory Visit: Payer: Self-pay | Admitting: Family

## 2023-04-29 ENCOUNTER — Ambulatory Visit (INDEPENDENT_AMBULATORY_CARE_PROVIDER_SITE_OTHER): Payer: 59 | Admitting: Podiatry

## 2023-04-29 DIAGNOSIS — M722 Plantar fascial fibromatosis: Secondary | ICD-10-CM | POA: Diagnosis not present

## 2023-04-29 DIAGNOSIS — Z1231 Encounter for screening mammogram for malignant neoplasm of breast: Secondary | ICD-10-CM

## 2023-04-29 DIAGNOSIS — M7752 Other enthesopathy of left foot: Secondary | ICD-10-CM

## 2023-04-29 NOTE — Progress Notes (Signed)
She presents today chief concern of pain to the second metatarsophalangeal joint of her left foot with recent heel pain.  States that the heel pain has just become painful and is primarily in the morning.  She states that her feet are swelling and that she keeps them elevated.  Objective: Vital signs stable alert and oriented x 3 pulses are palpable.  She has some loss of sensorium per Semmes Weinstein monofilament but is spotty to the forefoot.  She has pain on palpation medial calcaneal tubercle she also has pain on palpation and end range of motion of the second metatarsophalangeal joint mild bunion deformity.  Assessment: Pain secondary to Planter fasciitis and capsulitis of the second metatarsophalangeal joint on the left foot.  Plan: Discussed etiology pathology conservative versus surgical therapies.  At this point I injected dexamethasone local anesthetic around the second metatarsal phalangeal joint and 20 mg Kenalog to the left heel.  She tolerated procedure well without complications discussed appropriate shoe gear stretching exercise ice therapy sugar modifications.  Follow-up with her in 3 months

## 2023-06-03 ENCOUNTER — Ambulatory Visit
Admission: RE | Admit: 2023-06-03 | Discharge: 2023-06-03 | Disposition: A | Discharge status: Home / Self Care | Payer: 59 | Source: Ambulatory Visit | Attending: Family | Admitting: Family

## 2023-06-03 DIAGNOSIS — Z1231 Encounter for screening mammogram for malignant neoplasm of breast: Secondary | ICD-10-CM

## 2023-08-07 ENCOUNTER — Ambulatory Visit: Payer: 59 | Admitting: Podiatry

## 2024-04-27 LAB — HM DIABETES EYE EXAM

## 2024-05-11 ENCOUNTER — Ambulatory Visit (HOSPITAL_BASED_OUTPATIENT_CLINIC_OR_DEPARTMENT_OTHER): Admitting: Family Medicine

## 2024-05-11 ENCOUNTER — Encounter (HOSPITAL_BASED_OUTPATIENT_CLINIC_OR_DEPARTMENT_OTHER): Payer: Self-pay | Admitting: *Deleted

## 2024-05-11 ENCOUNTER — Encounter (HOSPITAL_BASED_OUTPATIENT_CLINIC_OR_DEPARTMENT_OTHER): Payer: Self-pay | Admitting: Family Medicine

## 2024-05-11 VITALS — BP 149/83 | HR 61 | Ht 62.0 in | Wt 227.3 lb

## 2024-05-11 DIAGNOSIS — I10 Essential (primary) hypertension: Secondary | ICD-10-CM

## 2024-05-11 DIAGNOSIS — G4733 Obstructive sleep apnea (adult) (pediatric): Secondary | ICD-10-CM | POA: Insufficient documentation

## 2024-05-11 DIAGNOSIS — E119 Type 2 diabetes mellitus without complications: Secondary | ICD-10-CM

## 2024-05-11 DIAGNOSIS — J449 Chronic obstructive pulmonary disease, unspecified: Secondary | ICD-10-CM | POA: Insufficient documentation

## 2024-05-11 DIAGNOSIS — Z122 Encounter for screening for malignant neoplasm of respiratory organs: Secondary | ICD-10-CM

## 2024-05-11 DIAGNOSIS — Z1211 Encounter for screening for malignant neoplasm of colon: Secondary | ICD-10-CM

## 2024-05-11 DIAGNOSIS — Z1231 Encounter for screening mammogram for malignant neoplasm of breast: Secondary | ICD-10-CM

## 2024-05-11 DIAGNOSIS — M1A0790 Idiopathic chronic gout, unspecified ankle and foot, without tophus (tophi): Secondary | ICD-10-CM | POA: Insufficient documentation

## 2024-05-11 DIAGNOSIS — E1159 Type 2 diabetes mellitus with other circulatory complications: Secondary | ICD-10-CM | POA: Diagnosis not present

## 2024-05-11 DIAGNOSIS — M797 Fibromyalgia: Secondary | ICD-10-CM | POA: Insufficient documentation

## 2024-05-11 DIAGNOSIS — I878 Other specified disorders of veins: Secondary | ICD-10-CM | POA: Diagnosis not present

## 2024-05-11 DIAGNOSIS — E1169 Type 2 diabetes mellitus with other specified complication: Secondary | ICD-10-CM | POA: Insufficient documentation

## 2024-05-11 DIAGNOSIS — E782 Mixed hyperlipidemia: Secondary | ICD-10-CM | POA: Insufficient documentation

## 2024-05-11 HISTORY — DX: Type 2 diabetes mellitus without complications: E11.9

## 2024-05-11 LAB — POCT UA - MICROALBUMIN
Albumin/Creatinine Ratio, Urine, POC: <30
Creatinine, POC: 200 mg/dL
Microalbumin Ur, POC: 30 mg/L

## 2024-05-11 NOTE — Progress Notes (Signed)
 New Patient Office Visit  Subjective:   Dawn Lucas 1956/05/25 05/11/2024  Chief Complaint  Patient presents with   New Patient (Initial Visit)    Patient is here today to get established with the practice. States she is needing to have a complete health workup.    Discussed the use of AI scribe software for clinical note transcription with the patient, who gave verbal consent to proceed.  History of Present Illness Dawn Lucas is a 68 year old female with multiple chronic conditions who presents for a new patient visit. Her prior PCP was Dawn Lucas at [facility]. Will obtain records.   She has not seen a doctor since last year due to her previous doctor moving and dissatisfaction with the practice's communication. She is seeking to establish care. She reports a history of type 2 diabetes, fibromyalgia, scoliosis, gout, sleep apnea, and COPD.   LEG CRAMPING:  She experiences tenderness to touch, cramps in her neck, legs, toes, and hands, and swelling in her ankles, especially after sitting for long periods. Swelling subsides overnight, and she has reduced her computer time to elevate her feet. She does not drive and uses public transportation, which exacerbates her symptoms. She takes Lasix 40 mg daily for swelling, but reports it is not effective. She reports she has previously seen a Vascular provider for venous stasis and was recommended sclerotherapy but patient declined. She does have compression stockings but does not use these due to pain. She does smoke daily and declines smoking cessation. She occasionally uses hydrocodone for severe pain due to fibromyalgia and Aleve or Advil  PM for sleep due to menopause-related insomnia   OSA:  She has sleep apnea but does not use her CPAP machine due to discomfort and issues with the mask. She cannot sleep on her back as it causes her to stop breathing.   DIABETES MELLITUS: Dawn Lucas presents for the medical  management of diabetes.  Current diabetes medication regimen: Glipizide 10mg  qd Patient is not adhering to a diabetic diet.  Patient is not exercising regularly.  Patient is not checking BS regularly.  Patient is  checking their feet regularly.  Denies polydipsia, polyphagia, polyuria, open wounds or ulcers on feet.   Lab Results  Component Value Date   HGBA1C 6.4 (A) 11/02/2018    Foot Exam: 05/11/2024 Lab Results  Component Value Date   MICROALBUR 30 05/11/2024    Wt Readings from Last 3 Encounters:  05/11/24 227 lb 4.8 oz (103.1 kg)  12/11/21 227 lb (103 kg)  06/12/21 222 lb (100.7 kg)     HEALTHCARE MAINTENANCE:  She has a history of a colonoscopy with polyp removal but was unable to complete a subsequent procedure due to difficulty with IV access. She has not had a mammogram this year and is due for one in August. She is allergic to contrast dye used in CT scans and has experienced severe reactions in the past.She is due for Ct Lung Screening.     The following portions of the patient's history were reviewed and updated as appropriate: past medical history, past surgical history, family history, social history, allergies, medications, and problem list.   Patient Active Problem List   Diagnosis Date Noted   Type 2 diabetes mellitus with other specified complication (HCC) 05/11/2024   Venous stasis of both lower extremities 05/11/2024   Idiopathic chronic gout of foot without tophus 05/11/2024   Fibromyalgia 05/11/2024   Chronic obstructive pulmonary disease (HCC)  05/11/2024   Mixed hyperlipidemia 05/11/2024   OSA (obstructive sleep apnea) 05/11/2024   Family history of malignant neoplasm of gastrointestinal tract 12/20/2021   History of colonic polyps 12/20/2021   Thickened endometrium    Prediabetes 04/28/2015   Pes planus of both feet 03/28/2015   Seborrheic keratoses 08/22/2014   Lower extremity edema 09/22/2013   Vitamin D  deficiency 09/22/2013   Knee pain,  bilateral 03/20/2012   Essential (primary) hypertension 11/04/2011   Generalized pain 10/15/2011   Back pain 02/08/2011   Obese 02/08/2011   Tobacco abuse 02/08/2011   Sleep disorder 02/08/2011   Past Medical History:  Diagnosis Date   Anemia    Arthritis    hands, knees   Asthma    Back pain    Colon cancer screening 06/17/2013   Diabetes mellitus (HCC) 05/11/2024   Fibromyalgia    Headache    Hyperlipidemia    Hypertension 11/04/2011   Poor dental hygiene    missing and chipped teeth   Sleep apnea    Uses CPAP nightly   SVD (spontaneous vaginal delivery)    x 1   Tendinitis of left rotator cuff 06/17/2013   04/21/14 left shoulder subacromial injection for treatment of rotator cuff impingement syndrome     UTI (lower urinary tract infection)    Past Surgical History:  Procedure Laterality Date   CHOLECYSTECTOMY     COLON SURGERY     DILATION AND CURETTAGE OF UTERUS     MAB x 4   HYSTEROSCOPY WITH D & C N/A 02/23/2018   Procedure: DILATATION AND CURETTAGE /HYSTEROSCOPY;  Surgeon: Dawn Gong, MD;  Location: [facility];  Service: Gynecology;  Laterality: N/A;   OVARIAN CYST REMOVAL     Family History  Problem Relation Age of Onset   Colon cancer Mother    Heart attack Sister    Diabetes Sister    Diabetes type II Maternal Grandmother    Diabetes type II Paternal Grandmother    HIV Brother    Cirrhosis Brother    HIV Brother    Breast cancer Neg Hx    Social History   Socioeconomic History   Marital status: Single    Spouse name: Not on file   Number of children: 1   Years of education: Not on file   Highest education level: 10th grade  Occupational History   Occupation: disabled  Tobacco Use   Smoking status: Every Day    Current packs/day: 0.50    Average packs/day: 0.5 packs/day for 50.0 years (25.0 ttl pk-yrs)    Types: Cigarettes   Smokeless tobacco: Former    Types: Snuff, Chew  Vaping Use   Vaping status: Never Used  Substance and Sexual  Activity   Alcohol use: Yes    Comment: rarely    Drug use: No   Sexual activity: Yes    Birth control/protection: Condom, Post-menopausal  Other Topics Concern   Not on file  Social History Narrative   Patient is right-handed. She lives alone.She drinks 3-4 cups of coffee a week, and green tea most days.   Social Drivers of Corporate investment banker Strain: Not on file  Food Insecurity: Not on file  Transportation Needs: Not on file  Physical Activity: Not on file  Stress: Not on file  Social Connections: Not on file  Intimate Partner Violence: Not on file   Outpatient Medications Prior to Visit  Medication Sig Dispense Refill   albuterol  (VENTOLIN  HFA) 108 (90 Base)  MCG/ACT inhaler Inhale 2 puffs into the lungs every 6 (six) hours as needed for wheezing or shortness of breath. 18 g 2   atorvastatin (LIPITOR) 20 MG tablet Take 20 mg by mouth daily.     calcium-vitamin D  (OSCAL WITH D) 500-200 MG-UNIT tablet Take 1 tablet by mouth.     Cholecalciferol (VITAMIN D3) 25 MCG (1000 UT) CAPS Take 1 capsule by mouth daily.     furosemide (LASIX) 40 MG tablet Take 40 mg by mouth daily.     glipiZIDE (GLUCOTROL) 10 MG tablet Take 10 mg by mouth daily.     HYDROcodone-acetaminophen (NORCO/VICODIN) 5-325 MG tablet Take 1 tablet by mouth every 8 (eight) hours as needed for moderate pain (pain score 4-6).     losartan (COZAAR) 50 MG tablet Take 50 mg by mouth daily.     montelukast (SINGULAIR) 10 MG tablet Take 10 mg by mouth daily.     SYMBICORT 80-4.5 MCG/ACT inhaler Inhale 2 puffs into the lungs 2 (two) times daily.     omeprazole (PRILOSEC) 40 MG capsule Take 40 mg by mouth daily.     pantoprazole  (PROTONIX ) 20 MG tablet Take 1 tablet by mouth daily.     pregabalin  (LYRICA ) 200 MG capsule Take 1 capsule (200 mg total) by mouth 2 (two) times daily. 60 capsule 5   Ascorbic Acid (VITAMIN C) 125 MG CHEW Chew 1 tablet by mouth daily.     colchicine 0.6 MG tablet Take 0.6 mg by mouth 2 (two)  times daily. As needed for gout flare (Patient not taking: Reported on 05/11/2024)     cromolyn (OPTICROM) 4 % ophthalmic solution Place 1 drop into both eyes daily.     diphenhydrAMINE  (BENADRYL ) 25 MG tablet Take 1 tablet (25 mg total) by mouth 3 (three) times daily. Take one tablet three times daily for two days 10 tablet 0   DULoxetine  (CYMBALTA ) 60 MG capsule Take 60 mg by mouth daily.     hydrOXYzine (ATARAX) 50 MG tablet Take 50-100 mg by mouth at bedtime.     meloxicam  (MOBIC ) 15 MG tablet Take 15 mg by mouth daily.     Probiotic Product (PROBIOTIC-10 PO) Take by mouth.     No facility-administered medications prior to visit.   Allergies  Allergen Reactions   Contrast Media [Iodinated Contrast Media] Itching, Swelling and Rash    Cardiac CTA on 01/08/21. Began having rash, hives, itching, facial swelling, and neck swelling on 01/09/21. No improvement with PO benadryl  on 01/09/21.   Quinolones Itching and Rash    Other reaction(s): rash   Sulfa Antibiotics Rash    ROS: A complete ROS was performed with pertinent positives/negatives noted in the HPI. The remainder of the ROS are negative.   Objective:   Today's Vitals   05/11/24 1304 05/11/24 1438  BP: (!) 149/84 (!) 149/83  Pulse: 61   SpO2: 100%   Weight: 227 lb 4.8 oz (103.1 kg)   Height: 5' 2 (1.575 m)     GENERAL: Well-appearing, in NAD. Well nourished.  SKIN: Pink, warm and dry.  Head: Normocephalic. NECK: Trachea midline. Full ROM w/o pain or tenderness.  RESPIRATORY: Chest wall symmetrical. Respirations even and non-labored. Breath sounds clear to auscultation bilaterally.  CARDIAC: S1, S2 present, regular rate and rhythm without murmur or gallops. Peripheral pulses 2+ bilaterally.  MSK: Muscle tone and strength appropriate for age.  EXTREMITIES: Without clubbing, cyanosis. Mild +1 non pitting dependent edema.  NEUROLOGIC: No motor or sensory deficits.  Steady, even gait. C2-C12 intact.  PSYCH/MENTAL STATUS: Alert,  oriented x 3. Cooperative, appropriate mood and affect.    Health Maintenance Due  Topic Date Due   Medicare Annual Wellness (AWV)  Never done   Hepatitis C Screening  Never done   Zoster Vaccines- Shingrix (1 of 2) 01/13/1974   Colonoscopy  Never done   HEMOGLOBIN A1C  05/03/2019   DTaP/Tdap/Td (2 - Td or Tdap) 08/21/2021   Diabetic kidney evaluation - eGFR measurement  12/18/2021   COVID-19 Vaccine (4 - 2024-25 season) 06/08/2023   Lung Cancer Screening  07/10/2023    Results for orders placed or performed in visit on 05/11/24  HM DIABETES EYE EXAM  Result Value Ref Range   HM Diabetic Eye Exam No Retinopathy No Retinopathy  Results for orders placed or performed in visit on 05/11/24  POCT UA - Microalbumin  Result Value Ref Range   Microalbumin Ur, POC 30 mg/L   Creatinine, POC 200 mg/dL   Albumin/Creatinine Ratio, Urine, POC <30        Assessment & Plan:  1. Type 2 diabetes mellitus with other circulatory complication, without long-term current use of insulin (HCC) (Primary) Type 2 diabetes with unknown recent A1c. No home glucose monitoring, relies on medication. - Order lab work to check A1c and electrolytes. - Obtain records from previous provider to update diabetes management. - Hemoglobin A1c - Comprehensive metabolic panel with GFR - POCT UA - Microalbumin  2. Breast cancer screening by mammogram - MM 3D SCREENING MAMMOGRAM BILATERAL BREAST; Future  3. Screening for colon cancer - Ambulatory referral to Gastroenterology  4. Encounter for screening for lung cancer Placed order with note to avoid Contrast due to allergy.  - CT CHEST LUNG CA SCREEN LOW DOSE W/O CM; Future  5. Venous stasis of both lower extremities Chronic venous stasis causing swelling, pain, and cramps, exacerbated by prolonged sitting or standing. Compression socks not tolerated. Smoking may worsen symptoms. PCP recommended to Order ultrasound to assess blood flow in the legs. Patient  declined. She declined compression stockings that could be ordered to fit her calves better as well. She is aware she is able to change her mind at anytime for these orders.    6. Essential (primary) hypertension Uncontrolled. Obtain labs today and titrate BP medication pending labs.   7. OSA (obstructive sleep apnea) Uncontrolled. Patient declined referral to Pulm. For resources of CPAP and verbalized understanding of risk of OSA.   Patient to reach out to office if new, worrisome, or unresolved symptoms arise or if no improvement in patient's condition. Patient verbalized understanding and is agreeable to treatment plan. All questions answered to patient's satisfaction.   A total of 65 minutes were spent on this encounter today including face to face, ordering and reviewing labs, reviewing patient's previous records from PCP, pain management, documenting in the record and ordering  labs, medications and screenings.   Return in about 4 months (around 09/10/2024) for ANNUAL PHYSICAL, DIABETES CHECK UP, HYPERTENSION.    Thersia Schuyler Stark, [place]

## 2024-05-11 NOTE — Patient Instructions (Signed)
 Affordable Dental Services for Adults   [facility] [address] [place], [place] [address] [phone] Access provides dental services to  uninsured [facility] residents  who are enrolled in the [facility]. Services will  be limited to comprehensive  examinations, extractions, fillings, pain  management and some minor  restorative care. In order to qualify for  services, patients will be referred by  [facility]  [facility]) Safety Net Organizations that  already provide medical care to  residents with incomes between 0% and  200% of the federal poverty level.   Payment Options $40.00 per visit  (CASH ONLY)    [facility]'s Dental Clinic Our Dental Clinic is open Monday through Thursday from 7:30 am - 4:00 pm. The dental clinic is available for adults and children with:  [place] Medicaid, [place] [facility] Choice, [facility], and Self-Pay. Uninsured patients can be seen on a sliding fee scale based on household income.    [facility] [employer] [facility] [address] [place], [place] [address] Phone [phone] Fax [phone]  Our clinic is open Monday through Friday 8:00 a.m. until 5:00 p.m, with the exception of Wednesday 10 a.m. to 5 p.m.  Medicaid and other insurance plans are welcome. Payment for services is due when services are rendered and may be made by cash or credit card.  If you have dental insurance, we will assist you with your claim submission   [email or web address]  [facility] Locations: [facility] [address] Johnella Naas [place], [place] [address] [phone]   [facility] [address] [place], [place] [address] [phone]  [facility] [address] Stanley Earls [place], [place] [address] [phone]  [facility] [address] Alana Hoyle [place], [place] [address] [phone]  [facility] [address], [place], [place] [address] [phone]    [facility] Location: [facility] [address] [place], [place] [address] Details: This 45-chair MOM Clinic is open to all patients; no pre-registration is required. Doors open at 6:00 a.m.

## 2024-05-12 ENCOUNTER — Telehealth: Payer: Self-pay

## 2024-05-12 DIAGNOSIS — F17210 Nicotine dependence, cigarettes, uncomplicated: Secondary | ICD-10-CM

## 2024-05-12 DIAGNOSIS — Z87891 Personal history of nicotine dependence: Secondary | ICD-10-CM

## 2024-05-12 DIAGNOSIS — Z122 Encounter for screening for malignant neoplasm of respiratory organs: Secondary | ICD-10-CM

## 2024-05-12 NOTE — Telephone Encounter (Signed)
 Lung Cancer Screening Narrative/Criteria Questionnaire (Cigarette Smokers Only- No Cigars/Pipes/vapes)   Dawn Lucas   SDMV: 05/18/2024 at 12:30 pm        12/12/1955               LDCT: 06/09/2024 at 1:00 pm at GI    68 y.o.   Phone: [phone]  Lung Screening Narrative (confirm age 73-77 yrs Medicare / 50-80 yrs Private pay insurance)   Insurance information: UHC Medicare   Referring Provider: Knute, NP   This screening involves an initial phone call with a team member from our program. It is called a shared decision making visit. The initial meeting is required by  insurance and Medicare to make sure you understand the program. This appointment takes about 15-20 minutes to complete. You will complete the screening scan at your scheduled date/time.  This scan takes about 5-10 minutes to complete. You can eat and drink normally before and after the scan.  Criteria questions for Lung Cancer Screening:   Are you a current or former smoker? Current Age began smoking: 9   If you are a former smoker, what year did you quit smoking? Never quit (within 15 yrs)   To calculate your smoking history, I need an accurate estimate of how many packs of cigarettes you smoked per day and for how many years. (Not just the number of PPD you are now smoking)   Years smoking 59 x Packs per day .5 = Pack years 29.5   (at least 20 pack yrs)   (Make sure they understand that we need to know how much they have smoked in the past, not just the number of PPD they are smoking now)  Do you have a personal history of cancer?  No    Do you have a family history of cancer? Yes  (cancer type and and relative) Mother had colon cancer. Aunt also had colon cancer.   Are you coughing up blood?  No  Have you had unexplained weight loss of 15 lbs or more in the last 6 months? No  It looks like you meet all criteria.  When would be a good time for us  to schedule you for this screening?   Additional information:  N/A

## 2024-05-18 ENCOUNTER — Encounter: Admitting: Acute Care

## 2024-05-18 NOTE — Progress Notes (Signed)
 No answer x3. Left message on machine to call office to reschedule SDMV

## 2024-05-25 ENCOUNTER — Encounter (HOSPITAL_BASED_OUTPATIENT_CLINIC_OR_DEPARTMENT_OTHER): Payer: Self-pay | Admitting: Family Medicine

## 2024-05-25 DIAGNOSIS — R011 Cardiac murmur, unspecified: Secondary | ICD-10-CM | POA: Insufficient documentation

## 2024-05-25 DIAGNOSIS — I5032 Chronic diastolic (congestive) heart failure: Secondary | ICD-10-CM | POA: Insufficient documentation

## 2024-05-25 DIAGNOSIS — M159 Polyosteoarthritis, unspecified: Secondary | ICD-10-CM | POA: Insufficient documentation

## 2024-06-09 ENCOUNTER — Other Ambulatory Visit

## 2024-06-10 ENCOUNTER — Ambulatory Visit
Admission: RE | Admit: 2024-06-10 | Discharge: 2024-06-10 | Disposition: A | Discharge status: Home / Self Care | Source: Ambulatory Visit | Attending: Family Medicine | Admitting: Family Medicine

## 2024-06-10 DIAGNOSIS — Z1231 Encounter for screening mammogram for malignant neoplasm of breast: Secondary | ICD-10-CM | POA: Diagnosis not present

## 2024-06-15 ENCOUNTER — Ambulatory Visit (HOSPITAL_BASED_OUTPATIENT_CLINIC_OR_DEPARTMENT_OTHER): Payer: Self-pay | Admitting: Family Medicine

## 2024-06-15 NOTE — Progress Notes (Signed)
 Dawn Lucas,   Your mammogram results show no evidence of breast cancer. We will plan to repeat this in 1 year for routine screening.  If you should have concerns or changes in your breasts within the next year, please let us  know.   Thersia Stark, FNP-C

## 2024-06-18 ENCOUNTER — Encounter: Payer: Self-pay | Admitting: *Deleted

## 2024-06-18 ENCOUNTER — Ambulatory Visit (INDEPENDENT_AMBULATORY_CARE_PROVIDER_SITE_OTHER): Admitting: *Deleted

## 2024-06-18 DIAGNOSIS — F17210 Nicotine dependence, cigarettes, uncomplicated: Secondary | ICD-10-CM | POA: Diagnosis not present

## 2024-06-18 NOTE — Progress Notes (Signed)
  Virtual Visit via Telephone Note  I connected with Dawn Lucas on 06/18/24 at  2:30 PM EDT by telephone and verified that I am speaking with the correct person using two identifiers.  Location: Patient: in home Provider: [address] [place], [place], [address]    Shared Decision Making Visit Lung Cancer Screening Program [id number])   Eligibility: Age 68 y.o. Pack Years Smoking History Calculation 29.5 (# packs/per year x # years smoked) Recent History of coughing up blood  no Unexplained weight loss? no ( >Than 15 pounds within the last 6 months ) Prior History Lung / other cancer no (Diagnosis within the last 5 years already requiring surveillance chest CT Scans). Smoking Status Current Smoker Former Smokers: Years since quit:  NA  Quit Date: NA  Visit Components: Discussion included one or more decision making aids. yes Discussion included risk/benefits of screening. yes Discussion included potential follow up diagnostic testing for abnormal scans. yes Discussion included meaning and risk of over diagnosis. yes Discussion included meaning and risk of False Positives. yes Discussion included meaning of total radiation exposure. yes  Counseling Included: Importance of adherence to annual lung cancer LDCT screening. yes Impact of comorbidities on ability to participate in the program. yes Ability and willingness to under diagnostic treatment. yes  Smoking Cessation Counseling: Current Smokers:  Discussed importance of smoking cessation. yes Information about tobacco cessation classes and interventions provided to patient. yes Patient provided with ticket for LDCT Scan. yes Symptomatic Patient. no  Counseling NA Diagnosis Code: Tobacco Use Z72.0 Asymptomatic Patient yes  Counseling (Intermediate counseling: > three minutes counseling) [id number]  .Counseled patient 4 minutes regarding tobacco use.   Former Smokers:  Discussed the importance of maintaining  cigarette abstinence. yes Diagnosis Code: Personal History of Nicotine Dependence. S12.108 Information about tobacco cessation classes and interventions provided to patient. Yes Patient provided with ticket for LDCT Scan. yes Written Order for Lung Cancer Screening with LDCT placed in Epic. Yes (CT Chest Lung Cancer Screening Low Dose W/O CM) [id number] Z12.2-Screening of respiratory organs Z87.891-Personal history of nicotine dependence   Josette Ranger, RN 06/18/24

## 2024-06-18 NOTE — Patient Instructions (Signed)

## 2024-07-09 ENCOUNTER — Ambulatory Visit
Admission: RE | Admit: 2024-07-09 | Discharge: 2024-07-09 | Disposition: A | Discharge status: Home / Self Care | Source: Ambulatory Visit | Attending: Acute Care | Admitting: Acute Care

## 2024-07-09 DIAGNOSIS — F17210 Nicotine dependence, cigarettes, uncomplicated: Secondary | ICD-10-CM

## 2024-07-09 DIAGNOSIS — Z122 Encounter for screening for malignant neoplasm of respiratory organs: Secondary | ICD-10-CM

## 2024-07-09 DIAGNOSIS — Z87891 Personal history of nicotine dependence: Secondary | ICD-10-CM

## 2024-07-13 ENCOUNTER — Other Ambulatory Visit: Payer: Self-pay

## 2024-07-13 ENCOUNTER — Telehealth: Payer: Self-pay | Admitting: Acute Care

## 2024-07-13 DIAGNOSIS — F17210 Nicotine dependence, cigarettes, uncomplicated: Secondary | ICD-10-CM

## 2024-07-13 DIAGNOSIS — Z87891 Personal history of nicotine dependence: Secondary | ICD-10-CM

## 2024-07-13 DIAGNOSIS — Z122 Encounter for screening for malignant neoplasm of respiratory organs: Secondary | ICD-10-CM

## 2024-07-13 NOTE — Telephone Encounter (Signed)
 Spoke with patient by phone, using two patient identifiers, to review the results of recent LDCT.  No suspicious findings for lung cancer.  Atherosclerosis again noted.  Patient is prescribed statin medication.  New finding of  mild multichamber cardiomegaly.  Patient denies seeing cardiology.  She did have a heart test one time but doesn't follow with a cardiologist.  Patient has upcoming December PCP appointment and she will discuss at that time for any further recommendations.  Patient denies any chest pain or increased shortness of breath.  She acknowledged understanding of information and had no questions.  PCP faxed results and new annual LDCT order placed.

## 2024-07-15 NOTE — Progress Notes (Signed)
 This encounter was created in error - please disregard.

## 2024-07-23 DIAGNOSIS — J029 Acute pharyngitis, unspecified: Secondary | ICD-10-CM | POA: Diagnosis not present

## 2024-07-23 DIAGNOSIS — R509 Fever, unspecified: Secondary | ICD-10-CM | POA: Diagnosis not present

## 2024-08-10 ENCOUNTER — Other Ambulatory Visit (HOSPITAL_BASED_OUTPATIENT_CLINIC_OR_DEPARTMENT_OTHER): Payer: Self-pay | Admitting: Family Medicine

## 2024-08-10 ENCOUNTER — Other Ambulatory Visit (HOSPITAL_BASED_OUTPATIENT_CLINIC_OR_DEPARTMENT_OTHER)

## 2024-08-10 DIAGNOSIS — E1159 Type 2 diabetes mellitus with other circulatory complications: Secondary | ICD-10-CM

## 2024-08-10 DIAGNOSIS — E559 Vitamin D deficiency, unspecified: Secondary | ICD-10-CM

## 2024-08-10 DIAGNOSIS — Z0000 Encounter for general adult medical examination without abnormal findings: Secondary | ICD-10-CM

## 2024-08-11 ENCOUNTER — Ambulatory Visit (HOSPITAL_BASED_OUTPATIENT_CLINIC_OR_DEPARTMENT_OTHER): Payer: Self-pay | Admitting: Family Medicine

## 2024-08-11 LAB — CBC WITH DIFFERENTIAL/PLATELET
Basophils Absolute: 0 x10E3/uL (ref 0.0–0.2)
Basos: 1 %
EOS (ABSOLUTE): 0.1 x10E3/uL (ref 0.0–0.4)
Eos: 2 %
Hematocrit: 36.7 % (ref 34.0–46.6)
Hemoglobin: 12.1 g/dL (ref 11.1–15.9)
Immature Grans (Abs): 0 x10E3/uL (ref 0.0–0.1)
Immature Granulocytes: 0 %
Lymphocytes Absolute: 3.8 x10E3/uL — ABNORMAL HIGH (ref 0.7–3.1)
Lymphs: 51 %
MCH: 29.5 pg (ref 26.6–33.0)
MCHC: 33 g/dL (ref 31.5–35.7)
MCV: 90 fL (ref 79–97)
Monocytes Absolute: 0.5 x10E3/uL (ref 0.1–0.9)
Monocytes: 7 %
Neutrophils Absolute: 2.8 x10E3/uL (ref 1.4–7.0)
Neutrophils: 39 %
Platelets: 385 x10E3/uL (ref 150–450)
RBC: 4.1 x10E6/uL (ref 3.77–5.28)
RDW: 13.7 % (ref 11.7–15.4)
WBC: 7.2 x10E3/uL (ref 3.4–10.8)

## 2024-08-11 LAB — LIPID PANEL
Chol/HDL Ratio: 3.3 ratio (ref 0.0–4.4)
Cholesterol, Total: 142 mg/dL (ref 100–199)
HDL: 43 mg/dL (ref >39)
LDL Chol Calc (NIH): 84 mg/dL (ref 0–99)
Triglycerides: 75 mg/dL (ref 0–149)
VLDL Cholesterol Cal: 15 mg/dL (ref 5–40)

## 2024-08-11 LAB — COMPREHENSIVE METABOLIC PANEL WITH GFR
ALT: 7 IU/L (ref 0–32)
AST: 11 IU/L (ref 0–40)
Albumin: 4 g/dL (ref 3.9–4.9)
Alkaline Phosphatase: 70 IU/L (ref 49–135)
BUN/Creatinine Ratio: 18 (ref 12–28)
BUN: 17 mg/dL (ref 8–27)
Bilirubin Total: 0.6 mg/dL (ref 0.0–1.2)
CO2: 23 mmol/L (ref 20–29)
Calcium: 9.5 mg/dL (ref 8.7–10.3)
Chloride: 109 mmol/L — ABNORMAL HIGH (ref 96–106)
Creatinine, Ser: 0.94 mg/dL (ref 0.57–1.00)
Globulin, Total: 2.6 g/dL (ref 1.5–4.5)
Glucose: 100 mg/dL — ABNORMAL HIGH (ref 70–99)
Potassium: 4.5 mmol/L (ref 3.5–5.2)
Sodium: 144 mmol/L (ref 134–144)
Total Protein: 6.6 g/dL (ref 6.0–8.5)
eGFR: 66 mL/min/1.73 (ref >59)

## 2024-08-11 LAB — HEMOGLOBIN A1C
Est. average glucose Bld gHb Est-mCnc: 166 mg/dL
Hgb A1c MFr Bld: 7.4 % — ABNORMAL HIGH (ref 4.8–5.6)

## 2024-08-11 LAB — VITAMIN D 25 HYDROXY (VIT D DEFICIENCY, FRACTURES): Vit D, 25-Hydroxy: 16 ng/mL — ABNORMAL LOW (ref 30.0–100.0)

## 2024-08-11 LAB — TSH: TSH: 1.45 u[IU]/mL (ref 0.450–4.500)

## 2024-08-11 NOTE — Progress Notes (Signed)
 Hi Dawn Lucas, Y Your blood counts are stable.  Your electrolytes, kidney and liver function are also stable.  Your A1c has increased and is at 7.4 which is considered uncontrolled for type 2 diabetes.  The goal is less than 7.  At this time, we can make some changes to your diabetic medication if desired.  Making dietary changes is also helpful.  If you would like to discuss this further at your appointment first in December we would be happy to wait until that time.  Your vitamin D  is also low at 16.  Are you currently taking a vitamin D  supplement?  If not, I do recommend starting a vitamin D  supplement and can send this in for you.  Your cholesterol is well-controlled and no changes are needed to your statin medication.  Your thyroid function is also stable.

## 2024-08-23 NOTE — Progress Notes (Signed)
 Dawn Lucas                                          MRN: [id number]   08/23/2024   The VBCI Quality Team Specialist reviewed this patient medical record for the purposes of chart review for care gap closure. The following were reviewed: abstraction for care gap closure-glycemic status assessment and kidney health evaluation for diabetes:eGFR  and uACR.    VBCI Quality Team

## 2024-09-13 ENCOUNTER — Ambulatory Visit (INDEPENDENT_AMBULATORY_CARE_PROVIDER_SITE_OTHER): Admitting: Family Medicine

## 2024-09-13 ENCOUNTER — Encounter (HOSPITAL_BASED_OUTPATIENT_CLINIC_OR_DEPARTMENT_OTHER): Payer: Self-pay | Admitting: Family Medicine

## 2024-09-13 VITALS — BP 160/90 | HR 73 | Temp 98.5°F | Resp 20 | Ht 62.0 in | Wt 227.0 lb

## 2024-09-13 DIAGNOSIS — I517 Cardiomegaly: Secondary | ICD-10-CM

## 2024-09-13 DIAGNOSIS — E1159 Type 2 diabetes mellitus with other circulatory complications: Secondary | ICD-10-CM

## 2024-09-13 DIAGNOSIS — Z720 Tobacco use: Secondary | ICD-10-CM

## 2024-09-13 DIAGNOSIS — I10 Essential (primary) hypertension: Secondary | ICD-10-CM

## 2024-09-13 DIAGNOSIS — E559 Vitamin D deficiency, unspecified: Secondary | ICD-10-CM

## 2024-09-13 DIAGNOSIS — G4733 Obstructive sleep apnea (adult) (pediatric): Secondary | ICD-10-CM

## 2024-09-13 DIAGNOSIS — Z0000 Encounter for general adult medical examination without abnormal findings: Secondary | ICD-10-CM

## 2024-09-13 MED ORDER — OMEPRAZOLE 40 MG PO CPDR: 40.0000 mg | DELAYED_RELEASE_CAPSULE | Freq: Every day | ORAL | 3 refills | Status: AC

## 2024-09-13 MED ORDER — LOSARTAN POTASSIUM 50 MG PO TABS: 50.0000 mg | ORAL_TABLET | Freq: Every day | ORAL | 3 refills | Status: AC

## 2024-09-13 MED ORDER — FUROSEMIDE 40 MG PO TABS: 40.0000 mg | ORAL_TABLET | Freq: Every day | ORAL | 3 refills | Status: AC

## 2024-09-13 MED ORDER — ATORVASTATIN CALCIUM 20 MG PO TABS: 20.0000 mg | ORAL_TABLET | Freq: Every day | ORAL | 3 refills | Status: AC

## 2024-09-13 MED ORDER — ALBUTEROL SULFATE HFA 108 (90 BASE) MCG/ACT IN AERS: 2.0000 | INHALATION_SPRAY | Freq: Four times a day (QID) | RESPIRATORY_TRACT | 2 refills | Status: AC | PRN

## 2024-09-13 MED ORDER — SYMBICORT 80-4.5 MCG/ACT IN AERO: 2.0000 | INHALATION_SPRAY | Freq: Two times a day (BID) | RESPIRATORY_TRACT | 3 refills | Status: DC

## 2024-09-13 MED ORDER — DULOXETINE HCL 30 MG PO CPEP: ORAL_CAPSULE | ORAL | 3 refills | Status: AC

## 2024-09-13 MED ORDER — GLIPIZIDE 10 MG PO TABS: 10.0000 mg | ORAL_TABLET | Freq: Every day | ORAL | 3 refills | Status: AC

## 2024-09-13 MED ORDER — MONTELUKAST SODIUM 10 MG PO TABS: 10.0000 mg | ORAL_TABLET | Freq: Every day | ORAL | 3 refills | Status: AC

## 2024-09-13 NOTE — Patient Instructions (Addendum)
 Cymbalta :  Take 1 tablet (30mg ) by mouth at bedtime. Take 1 capsule by mouth daily for  2 week and then you can increase to 2 capsules (60 mg total) by mouth if needed.    Start using your Symbicort  (budesonide inhaler) with 2 puffs twice daily. Rinse your mouth and spit after each use to prevent thrush.    Ophthalmology Offices   [facility]  [address]  [place], [place] [address] [phone]  [facility] [address] [place], [place] [address] Phone: [phone]  [facility] [address] [place], [place] [address] Phone: [phone]  [facility]  Optometrist [address]  [phone]  [facility] Optometrist [address]  [phone]

## 2024-09-13 NOTE — Progress Notes (Unsigned)
 Subjective:   Dawn Lucas [birth year]  09/13/2024   CC: Chief Complaint  Patient presents with   Annual Exam    HPI: Dawn Lucas is a 68 y.o. female who presents for a routine health maintenance exam. She reports she has been out of her medications for the past 1-2 months and has only been taking her Lasix  and Atorvastatin .    Discussed the use of AI scribe software for clinical note transcription with the patient, who gave verbal consent to proceed.  History of Present Illness Dawn Lucas is a 68 year old female with sleep apnea and hypertension who presents for her physical.   She experiences significant fatigue and shortness of breath. She has daytime somnolence and insomnia, sleeping only two to four hours per night. She uses over-the-counter medications like Aleve or Advil  PM to aid sleep but remains tired, stating 'any little thing I'm tired.' She is noncompliant with her CPAP and does not use any devices for her obstructive sleep apnea (OSA).  She has been experiencing wheezing and uses her inhaler when necessary. She mentions swelling in her feet, requiring her to elevate them, which sometimes leads to her dozing off on the couch.  She has been out of her medications for diabetes and hypertension for one to two months, as they were previously filled by her prior primary care provider. She is no longer taking pregabalin  or Cymbalta  for her fibromyalgia but is open to retrying one of these medications.    HEALTH SCREENINGS: - Vision Screening: up to date - Dental Visits: up to date - Pap smear: not applicable - Breast Exam: up to date - STD Screening: Declined - Mammogram (40+): Up to date  - Colonoscopy (45+): Declined  - Bone Density (65+ or under 65 with predisposing conditions): Up to date  - Lung CA screening with low-dose CT:  Up to date Adults age 25-80 who are current cigarette smokers or quit within the last 15 years. Must have 20 pack year  history.   Depression and Anxiety Screen done today and results listed below:     05/11/2024    1:33 PM 12/11/2021   12:19 PM 06/12/2021   12:03 PM 03/08/2021   12:58 PM 12/14/2020   11:48 AM  Depression screen PHQ 2/9  Decreased Interest 1 0 0 0 0  Down, Depressed, Hopeless 0 1 0  0  PHQ - 2 Score 1 1 0 0 0  Altered sleeping 3    1  Tired, decreased energy 3    1  Change in appetite 0    3  Feeling bad or failure about yourself  0    0  Trouble concentrating 0    0  Moving slowly or fidgety/restless 0    0  Suicidal thoughts 0    0  PHQ-9 Score 7     5   Difficult doing work/chores Not difficult at all    Somewhat difficult     Data saved with a previous flowsheet row definition      05/11/2024    1:37 PM 03/10/2019   10:30 AM 11/02/2018   11:00 AM  GAD 7 : Generalized Anxiety Score  Nervous, Anxious, on Edge 1 0 0  Control/stop worrying 0 0 0  Worry too much - different things 0 0 0  Trouble relaxing 0 0 0  Restless 0 0 0  Easily annoyed or irritable 1 0 0  Afraid - awful might happen 0  0 0  Total GAD 7 Score 2 0 0  Anxiety Difficulty Not difficult at all Not difficult at all     IMMUNIZATIONS: - Tdap: Tetanus vaccination status reviewed: Recommended. - HPV: Not applicable - Influenza: Refused - Pneumovax: Up to date - Prevnar 20: Up to date - Shingrix (50+): Recommended   Past medical history, surgical history, medications, allergies, family history and social history reviewed with patient today and changes made to appropriate areas of the chart.   Past Medical History:  Diagnosis Date   Anemia    Arthritis    hands, knees   Asthma    Back pain    Colon cancer screening 06/17/2013   Diabetes mellitus (HCC) 05/11/2024   Fibromyalgia    Headache    Hyperlipidemia    Hypertension 11/04/2011   Poor dental hygiene    missing and chipped teeth   Sleep apnea    Uses CPAP nightly   SVD (spontaneous vaginal delivery)    x 1   Tendinitis of left rotator cuff  06/17/2013   04/21/14 left shoulder subacromial injection for treatment of rotator cuff impingement syndrome     UTI (lower urinary tract infection)     Past Surgical History:  Procedure Laterality Date   CHOLECYSTECTOMY     COLON SURGERY     DILATION AND CURETTAGE OF UTERUS     MAB x 4   HYSTEROSCOPY WITH D & C N/A 02/23/2018   Procedure: DILATATION AND CURETTAGE /HYSTEROSCOPY;  Surgeon: Alger Gong, MD;  Location: [facility];  Service: Gynecology;  Laterality: N/A;   OVARIAN CYST REMOVAL      Current Outpatient Medications on File Prior to Visit  Medication Sig   albuterol  (VENTOLIN  HFA) 108 (90 Base) MCG/ACT inhaler Inhale 2 puffs into the lungs every 6 (six) hours as needed for wheezing or shortness of breath.   atorvastatin  (LIPITOR) 20 MG tablet Take 20 mg by mouth daily.   calcium -vitamin D  (OSCAL WITH D) 500-200 MG-UNIT tablet Take 1 tablet by mouth.   Cholecalciferol (VITAMIN D3) 25 MCG (1000 UT) CAPS Take 1 capsule by mouth daily.   furosemide  (LASIX ) 40 MG tablet Take 40 mg by mouth daily.   glipiZIDE  (GLUCOTROL ) 10 MG tablet Take 10 mg by mouth daily.   HYDROcodone-acetaminophen (NORCO/VICODIN) 5-325 MG tablet Take 1 tablet by mouth every 8 (eight) hours as needed for moderate pain (pain score 4-6).   losartan  (COZAAR ) 50 MG tablet Take 50 mg by mouth daily.   montelukast  (SINGULAIR ) 10 MG tablet Take 10 mg by mouth daily.   omeprazole  (PRILOSEC) 40 MG capsule Take 40 mg by mouth daily.   pantoprazole  (PROTONIX ) 20 MG tablet Take 1 tablet by mouth daily.   pregabalin  (LYRICA ) 200 MG capsule Take 1 capsule (200 mg total) by mouth 2 (two) times daily.   SYMBICORT  80-4.5 MCG/ACT inhaler Inhale 2 puffs into the lungs 2 (two) times daily.   No current facility-administered medications on file prior to visit.    Allergies  Allergen Reactions   Contrast Media [Iodinated Contrast Media] Itching, Swelling and Rash    Cardiac CTA on 01/08/21. Began having rash, hives, itching,  facial swelling, and neck swelling on 01/09/21. No improvement with PO benadryl  on 01/09/21.   Quinolones Itching and Rash    Other reaction(s): rash   Sulfa Antibiotics Rash     Social History   Socioeconomic History   Marital status: Single    Spouse name: Not on file   Number  of children: 1   Years of education: Not on file   Highest education level: 10th grade  Occupational History   Occupation: disabled  Tobacco Use   Smoking status: Every Day    Current packs/day: 0.50    Average packs/day: 0.5 packs/day for 50.0 years (25.0 ttl pk-yrs)    Types: Cigarettes   Smokeless tobacco: Former    Types: Snuff, Chew  Vaping Use   Vaping status: Never Used  Substance and Sexual Activity   Alcohol use: Yes    Comment: rarely    Drug use: No   Sexual activity: Yes    Birth control/protection: Condom, Post-menopausal  Other Topics Concern   Not on file  Social History Narrative   Patient is right-handed. She lives alone.She drinks 3-4 cups of coffee a week, and green tea most days.   Social Drivers of Corporate Investment Banker Strain: Not on file  Food Insecurity: Not on file  Transportation Needs: Not on file  Physical Activity: Not on file  Stress: Not on file  Social Connections: Not on file  Intimate Partner Violence: Not on file   Social History   Tobacco Use  Smoking Status Every Day   Current packs/day: 0.50   Average packs/day: 0.5 packs/day for 50.0 years (25.0 ttl pk-yrs)   Types: Cigarettes  Smokeless Tobacco Former   Types: Snuff, Chew   Social History   Substance and Sexual Activity  Alcohol Use Yes   Comment: rarely     Family History  Problem Relation Age of Onset   Colon cancer Mother    Heart attack Sister    Diabetes Sister    Diabetes type II Maternal Grandmother    Diabetes type II Paternal Grandmother    HIV Brother    Cirrhosis Brother    HIV Brother    Breast cancer Neg Hx      ROS: Denies fever, fatigue, unexplained weight  loss/gain, chest pain, SHOB, and palpitations. Denies neurological deficits, gastrointestinal or genitourinary complaints, and skin changes.   Objective:   Today's Vitals   09/13/24 1317  BP: (!) 172/119  Pulse: 73  Resp: 20  Temp: 98.5 F (36.9 C)  TempSrc: Oral  SpO2: 99%  Weight: 227 lb (103 kg)  Height: 5' 2 (1.575 m)  PainSc: 5   PainLoc: Back    GENERAL APPEARANCE: Well-appearing, in NAD. Well nourished.  SKIN: Pink, warm and dry. Turgor normal. No rash, lesion, ulceration, or ecchymoses. Hair evenly distributed.  HEENT: HEAD: Normocephalic.  EYES: PERRLA. EOMI. Lids intact w/o defect. Sclera white, Conjunctiva pink w/o exudate.  EARS: External ear w/o redness, swelling, masses or lesions. EAC clear. TM's intact, translucent w/o bulging, appropriate landmarks visualized. Appropriate acuity to conversational tones.  NOSE: Septum midline w/o deformity. Nares patent, mucosa pink and non-inflamed w/o drainage. No sinus tenderness.  THROAT: Uvula midline. Oropharynx clear. Tonsils non-inflamed w/o exudate. Oral mucosa pink and moist.  NECK: Supple, Trachea midline. Full ROM w/o pain or tenderness. No lymphadenopathy. Thyroid non-tender w/o enlargement or palpable masses.  BREASTS: Breasts pendulous, symmetrical, and w/o palpable masses. Nipples everted and w/o discharge. No rash or skin retraction. No axillary or supraclavicular lymphadenopathy.  RESPIRATORY: Chest wall symmetrical w/o masses. Respirations even and non-labored. Breath sounds clear to auscultation bilaterally. No wheezes, rales, rhonchi, or crackles. CARDIAC: S1, S2 present, regular rate and rhythm. No gallops, murmurs, rubs, or clicks. PMI w/o lifts, heaves, or thrills. No carotid bruits. Capillary refill <2 seconds. Peripheral pulses 2+  bilaterally. GI: Abdomen soft w/o distention. Normoactive bowel sounds. No palpable masses or tenderness. No guarding or rebound tenderness. Liver and spleen w/o tenderness or  enlargement. No CVA tenderness.  GU: External genitalia without erythema, lesions, or masses. No lymphadenopathy. Vaginal mucosa pink and moist without exudate, lesions, or ulcerations. Cervix pink without discharge. Cervical os closed. Uterus and adnexae palpable, not enlarged, and w/o tenderness. No palpable masses.  MSK: Muscle tone and strength appropriate for age, w/o atrophy or abnormal movement.  EXTREMITIES: Active ROM intact, w/o tenderness, crepitus, or contracture. No obvious joint deformities or effusions. No clubbing, edema, or cyanosis.  NEUROLOGIC: CN's II-XII intact. Motor strength symmetrical with no obvious weakness. No sensory deficits. DTR's 2+ symmetric bilaterally. Steady, even gait.  PSYCH/MENTAL STATUS: Alert, oriented x 3. Cooperative, appropriate mood and affect.   Chaperoned by Damien Many, CMA   Results for orders placed or performed in visit on 08/10/24  CBC with Differential/Platelet   Collection Time: 08/10/24  1:12 PM  Result Value Ref Range   WBC 7.2 3.4 - 10.8 x10E3/uL   RBC 4.10 3.77 - 5.28 x10E6/uL   Hemoglobin 12.1 11.1 - 15.9 g/dL   Hematocrit 63.2 65.9 - 46.6 %   MCV 90 79 - 97 fL   MCH 29.5 26.6 - 33.0 pg   MCHC 33.0 31.5 - 35.7 g/dL   RDW 86.2 88.2 - 84.5 %   Platelets 385 150 - 450 x10E3/uL   Neutrophils 39 Not Estab. %   Lymphs 51 Not Estab. %   Monocytes 7 Not Estab. %   Eos 2 Not Estab. %   Basos 1 Not Estab. %   Neutrophils Absolute 2.8 1.4 - 7.0 x10E3/uL   Lymphocytes Absolute 3.8 (H) 0.7 - 3.1 x10E3/uL   Monocytes Absolute 0.5 0.1 - 0.9 x10E3/uL   EOS (ABSOLUTE) 0.1 0.0 - 0.4 x10E3/uL   Basophils Absolute 0.0 0.0 - 0.2 x10E3/uL   Immature Granulocytes 0 Not Estab. %   Immature Grans (Abs) 0.0 0.0 - 0.1 x10E3/uL  Comprehensive metabolic panel with GFR   Collection Time: 08/10/24  1:12 PM  Result Value Ref Range   Glucose 100 (H) 70 - 99 mg/dL   BUN 17 8 - 27 mg/dL   Creatinine, Ser 9.05 0.57 - 1.00 mg/dL   eGFR 66 >40 fO/fpw/8.26    BUN/Creatinine Ratio 18 12 - 28   Sodium 144 134 - 144 mmol/L   Potassium 4.5 3.5 - 5.2 mmol/L   Chloride 109 (H) 96 - 106 mmol/L   CO2 23 20 - 29 mmol/L   Calcium  9.5 8.7 - 10.3 mg/dL   Total Protein 6.6 6.0 - 8.5 g/dL   Albumin 4.0 3.9 - 4.9 g/dL   Globulin, Total 2.6 1.5 - 4.5 g/dL   Bilirubin Total 0.6 0.0 - 1.2 mg/dL   Alkaline Phosphatase 70 49 - 135 IU/L   AST 11 0 - 40 IU/L   ALT 7 0 - 32 IU/L  Lipid panel   Collection Time: 08/10/24  1:12 PM  Result Value Ref Range   Cholesterol, Total 142 100 - 199 mg/dL   Triglycerides 75 0 - 149 mg/dL   HDL 43 >60 mg/dL   VLDL Cholesterol Cal 15 5 - 40 mg/dL   LDL Chol Calc (NIH) 84 0 - 99 mg/dL   Chol/HDL Ratio 3.3 0.0 - 4.4 ratio  TSH   Collection Time: 08/10/24  1:12 PM  Result Value Ref Range   TSH 1.450 0.450 - 4.500 uIU/mL  Hemoglobin A1c   Collection Time: 08/10/24  1:12 PM  Result Value Ref Range   Hgb A1c MFr Bld 7.4 (H) 4.8 - 5.6 %   Est. average glucose Bld gHb Est-mCnc 166 mg/dL  VITAMIN D  25 Hydroxy (Vit-D Deficiency, Fractures)   Collection Time: 08/10/24  1:12 PM  Result Value Ref Range   Vit D, 25-Hydroxy 16.0 (L) 30.0 - 100.0 ng/mL    Assessment & Plan:  ***  No orders of the defined types were placed in this encounter.   PATIENT COUNSELING:  - Encouraged a healthy well-balanced diet. Patient may adjust caloric intake to maintain or achieve ideal body weight. May reduce intake of dietary saturated fat and total fat and have adequate dietary potassium and calcium  preferably from fresh fruits, vegetables, and low-fat dairy products.   - Advised to avoid cigarette smoking. - Discussed with the patient that most people either abstain from alcohol or drink within safe limits (<=14/week and <=4 drinks/occasion for males, <=7/weeks and <= 3 drinks/occasion for females) and that the risk for alcohol disorders and other health effects rises proportionally with the number of drinks per week and how often a drinker  exceeds daily limits. - Discussed cessation/primary prevention of drug use and availability of treatment for abuse.  - Discussed sexually transmitted diseases, avoidance of unintended pregnancy and contraceptive alternatives.  - Stressed the importance of regular exercise - Injury prevention: Discussed safety belts, safety helmets, smoke detector, smoking near bedding or upholstery.  - Dental health: Discussed importance of regular tooth brushing, flossing, and dental visits.   NEXT PREVENTATIVE PHYSICAL DUE IN 1 YEAR.  No follow-ups on file.  Patient to reach out to office if new, worrisome, or unresolved symptoms arise or if no improvement in patient's condition. Patient verbalized understanding and is agreeable to treatment plan. All questions answered to patient's satisfaction.    Thersia Schuyler Stark, [place]

## 2024-09-21 ENCOUNTER — Encounter (HOSPITAL_BASED_OUTPATIENT_CLINIC_OR_DEPARTMENT_OTHER): Payer: Self-pay

## 2024-09-21 ENCOUNTER — Ambulatory Visit (INDEPENDENT_AMBULATORY_CARE_PROVIDER_SITE_OTHER)

## 2024-09-21 VITALS — BP 116/79 | HR 64 | Ht 62.0 in | Wt 223.0 lb

## 2024-09-21 DIAGNOSIS — G4733 Obstructive sleep apnea (adult) (pediatric): Secondary | ICD-10-CM

## 2024-09-21 DIAGNOSIS — F17210 Nicotine dependence, cigarettes, uncomplicated: Secondary | ICD-10-CM

## 2024-09-21 DIAGNOSIS — J449 Chronic obstructive pulmonary disease, unspecified: Secondary | ICD-10-CM

## 2024-09-21 DIAGNOSIS — R911 Solitary pulmonary nodule: Secondary | ICD-10-CM

## 2024-09-21 NOTE — Progress Notes (Signed)
 @Patient  ID: Dawn Lucas, female    DOB: 1956-02-08, 68 y.o.   MRN: [id number]  Chief Complaint  Patient presents with   Establish Care    New sleep     Referring provider: Knute Thersia Bitters, FNP  HPI: Discussed the use of AI scribe software for clinical note transcription with the patient, who gave verbal consent to proceed.  History of Present Illness Dawn Lucas is a 68 year old female with sleep apnea who presents with sleep disturbances and CPAP intolerance. She was referred by her doctor for evaluation of her sleep apnea.  She has a history of sleep apnea, initially diagnosed at [facility] several years ago, confirmed by a sleep study showing 34.8 events per hour. She was prescribed a CPAP machine but finds it intolerable due to discomfort with the mask, leading to discontinuation of its use. She has worn it off and on over the last few years, but notes that her current machine is broken and about 69 years old.  The mask twists and causes her to wake up choking, and she has not used it consistently.   She experiences significant sleep disturbances, sleeping only 2 to 4 hours at a time and feeling tired throughout the day. She has difficulty maintaining a regular sleep schedule, often falling asleep for short periods and then being unable to sleep through the night. This has led to persistent fatigue and exhaustion with minimal activity.  Her appetite is irregular; she eats once or twice a day and has stopped eating at night to manage her weight. She has lost weight over the past few years, from 250 pounds to 230 pounds, and attributes this to her height of 5'2 being disproportionate to her previous weight.  She has a history of asthma, for which she uses Symbicort  and albuterol . Her breathing improves with these medications, particularly at night when she experiences wheezing. She has been using Symbicort  for over a year, prescribed by a previous doctor. She  smokes cigarettes daily.  Her past medical history includes high blood pressure, diabetes, fibromyalgia, arthritis, scoliosis, gout, and year-round allergies. She uses allergy pills to manage her symptoms. Asthma is noted to be hereditary in her family.  She experiences anxiety attacks, which she associates with her breathing difficulties. She also mentions having an enlarged heart, which she was informed about previously and follows with Cardiology for.   TEST/EVENTS : split night study 2017:  AHI 34.8/hr and best tested pressure CPAP 7 cm H2O  Allergies[1]  Immunization History  Administered Date(s) Administered   Influenza Split 07/18/2011   Influenza-Unspecified 10/28/2022   PFIZER(Purple Top)SARS-COV-2 Vaccination 01/28/2020, 02/18/2020, 10/16/2020   PNEUMOCOCCAL CONJUGATE-20 02/08/2022   Pneumococcal Polysaccharide-23 04/25/2015   Tdap 08/22/2011   Zoster, Live 08/30/2016    Past Medical History:  Diagnosis Date   Anemia    Arthritis    hands, knees   Asthma    Back pain    Colon cancer screening 06/17/2013   Diabetes mellitus (HCC) 05/11/2024   Fibromyalgia    Headache    Hyperlipidemia    Hypertension 11/04/2011   Poor dental hygiene    missing and chipped teeth   Sleep apnea    Uses CPAP nightly   SVD (spontaneous vaginal delivery)    x 1   Tendinitis of left rotator cuff 06/17/2013   04/21/14 left shoulder subacromial injection for treatment of rotator cuff impingement syndrome     UTI (lower urinary tract infection)  Tobacco History: Tobacco Use History[2] Ready to quit: Not Answered Counseling given: Not Answered   Outpatient Medications Prior to Visit  Medication Sig Dispense Refill   albuterol  (VENTOLIN  HFA) 108 (90 Base) MCG/ACT inhaler Inhale 2 puffs into the lungs every 6 (six) hours as needed for wheezing or shortness of breath. 18 g 2   atorvastatin  (LIPITOR) 20 MG tablet Take 1 tablet (20 mg total) by mouth daily. 90 tablet 3    Cholecalciferol (VITAMIN D3) 25 MCG (1000 UT) CAPS Take 1 capsule by mouth daily.     DULoxetine  (CYMBALTA ) 30 MG capsule Take 1 capsule (30 mg total) by mouth daily for 14 days, THEN 2 capsules (60 mg total) daily. 75 capsule 3   furosemide  (LASIX ) 40 MG tablet Take 1 tablet (40 mg total) by mouth daily. 90 tablet 3   glipiZIDE  (GLUCOTROL ) 10 MG tablet Take 1 tablet (10 mg total) by mouth daily. 90 tablet 3   losartan  (COZAAR ) 50 MG tablet Take 1 tablet (50 mg total) by mouth daily. 90 tablet 3   montelukast  (SINGULAIR ) 10 MG tablet Take 1 tablet (10 mg total) by mouth daily. 90 tablet 3   omeprazole  (PRILOSEC) 40 MG capsule Take 1 capsule (40 mg total) by mouth daily. 90 capsule 3   SYMBICORT  80-4.5 MCG/ACT inhaler Inhale 2 puffs into the lungs 2 (two) times daily. 10.2 g 3   No facility-administered medications prior to visit.     Review of Systems: as per hpi  Constitutional:   No  weight loss, night sweats,  Fevers, chills, fatigue, or  lassitude.  HEENT:   No headaches,  Difficulty swallowing,  Tooth/dental problems, or  Sore throat,                No sneezing, itching, ear ache, nasal congestion, post nasal drip,   CV:  No chest pain,  Orthopnea, PND, swelling in lower extremities, anasarca, dizziness, palpitations, syncope.   GI  No heartburn, indigestion, abdominal pain, nausea, vomiting, diarrhea, change in bowel habits, loss of appetite, bloody stools.   Resp: No shortness of breath with exertion or at rest.  No excess mucus, no productive cough,  No non-productive cough,  No coughing up of blood.  No change in color of mucus.  No wheezing.  No chest wall deformity  Skin: no rash or lesions.  GU: no dysuria, change in color of urine, no urgency or frequency.  No flank pain, no hematuria   MS:  No joint pain or swelling.  No decreased range of motion.  No back pain.    Physical Exam  BP (!) 159/83   Pulse 64   Ht 5' 2 (1.575 m)   Wt 223 lb (101.2 kg)   SpO2 98%    BMI 40.79 kg/m   GEN: A/Ox3; pleasant , NAD, well nourished    HEENT:  [place]/AT,  EACs-clear, TMs-wnl, NOSE-clear, THROAT-clear, no lesions, no postnasal drip or exudate noted. Mallampati 3  NECK:  Supple w/ fair ROM; no JVD; normal carotid impulses w/o bruits; no thyromegaly or nodules palpated; no lymphadenopathy.    RESP  Clear  P & A; w/o, wheezes/ rales/ or rhonchi. no accessory muscle use, no dullness to percussion  CARD:  RRR, no m/r/g, no peripheral edema, pulses intact, no cyanosis or clubbing.  GI:   obese, soft & nt; nml bowel sounds; no organomegaly or masses detected.   Musco: Warm bil, no deformities or joint swelling noted.   Neuro: alert, no focal  deficits noted.    Skin: Warm, no lesions or rashes    Lab Results:  CBC    Component Value Date/Time   WBC 7.2 08/10/2024 1312   WBC 10.8 (H) 02/13/2018 1107   RBC 4.10 08/10/2024 1312   RBC 4.44 02/13/2018 1107   HGB 12.1 08/10/2024 1312   HCT 36.7 08/10/2024 1312   PLT 385 08/10/2024 1312   MCV 90 08/10/2024 1312   MCH 29.5 08/10/2024 1312   MCH 29.3 02/13/2018 1107   MCHC 33.0 08/10/2024 1312   MCHC 32.7 02/13/2018 1107   RDW 13.7 08/10/2024 1312   LYMPHSABS 3.8 (H) 08/10/2024 1312   MONOABS 522 10/17/2016 1127   EOSABS 0.1 08/10/2024 1312   BASOSABS 0.0 08/10/2024 1312    BMET    Component Value Date/Time   NA 144 08/10/2024 1312   K 4.5 08/10/2024 1312   CL 109 (H) 08/10/2024 1312   CO2 23 08/10/2024 1312   GLUCOSE 100 (H) 08/10/2024 1312   GLUCOSE 160 (H) 02/13/2018 1107   BUN 17 08/10/2024 1312   CREATININE 0.94 08/10/2024 1312   CREATININE 1.03 (H) 10/17/2016 1127   CALCIUM  9.5 08/10/2024 1312   CALCIUM  9.3 02/10/2022 1230   GFRNONAA 59 (L) 12/04/2018 1019   GFRNONAA 59 (L) 10/17/2016 1127   GFRAA 68 12/04/2018 1019   GFRAA 68 10/17/2016 1127    BNP No results found for: BNP  ProBNP No results found for: PROBNP  Imaging: No results found.  Administration History     None            No data to display          No results found for: NITRICOXIDE   Assessment & Plan:   Assessment & Plan Cigarette smoker  Lung nodule  OSA (obstructive sleep apnea)  Chronic obstructive pulmonary disease, unspecified COPD type (HCC)  Assessment and Plan Assessment & Plan Obstructive sleep apnea Severe apnea with 34.8 events per hour. Previous CPAP use discontinued due to discomfort and broken machine. Weight loss may affect CPAP fit and efficacy. Untreated apnea increases cardiovascular risk. - Ordered new CPAP machine with mask fitting. - Consider home sleep test if required for new CPAP. - Scheduled follow-up in 31-90 days to review compliance and troubleshoot mask issues.  Chronic obstructive pulmonary disease COPD with dyspnea and wheezing improved by Symbicort . Smoking exacerbates symptoms and disease progression. Emphasized smoking cessation to prevent respiratory decline and oxygen dependency. - Continue Symbicort  two puffs twice daily. - Use albuterol  as needed for acute symptoms. - Advised smoking cessation. - check PFTs at next appt; consider titrating up dose of Symbicort  from 80 to 160  Nicotine dependence, cigarettes Nicotine dependence exacerbates respiratory and cardiovascular issues. Emphasized cessation to prevent health deterioration. - Advised smoking cessation.    Return in about 7 weeks (around 11/09/2024) for PFT; compliance download.  Candis Dandy, PA-C 09/21/2024      [1]  Allergies Allergen Reactions   Contrast Media [Iodinated Contrast Media] Itching, Swelling and Rash    Cardiac CTA on 01/08/21. Began having rash, hives, itching, facial swelling, and neck swelling on 01/09/21. No improvement with PO benadryl  on 01/09/21.   Quinolones Itching and Rash    Other reaction(s): rash   Sulfa Antibiotics Rash  [2]  Social History Tobacco Use  Smoking Status Every Day   Current packs/day: 0.50   Average packs/day: 0.5 packs/day  for 50.0 years (25.0 ttl pk-yrs)   Types: Cigarettes  Smokeless Tobacco Former  Types: Snuff, Chew

## 2024-09-21 NOTE — Patient Instructions (Addendum)
 New CPAP order placed today.  Start wearing CPAP nightly; goal of at least 4-6 hours.  Plan for follow up in 31-90 days to review compliance download.  Continue Symbicort , Singulair , and albuterol .

## 2024-09-21 NOTE — Progress Notes (Signed)
 Epworth Sleepiness Scale  Use the following scale to choose the most appropriate number for each situation. 0 Would never nod off 1  Slight  chance of nodding off 2 Moderate chance of nodding off 3 High chance of nodding off  Sitting and reading: 0 Watching TV: 3 Sitting, inactive, in a public place (e.g., in a meeting, theater, or dinner event): 0 As a passenger in a car for an hour or more without stopping for a break: 0 Lying down to rest when circumstances permit:3 Sitting and talking to someone: 0 Sitting quietly after a meal without alcohol: 0 In a car, while stopped for a few  minutes in traffic or at a light: 0  TOTOAL: 6

## 2024-10-06 ENCOUNTER — Telehealth (HOSPITAL_BASED_OUTPATIENT_CLINIC_OR_DEPARTMENT_OTHER): Payer: Self-pay | Admitting: *Deleted

## 2024-10-08 NOTE — Telephone Encounter (Signed)
 CMN received for CPAP/BiPAP supplies signed by provider and faxed confirmation received DME: [facility]

## 2024-10-11 ENCOUNTER — Ambulatory Visit (INDEPENDENT_AMBULATORY_CARE_PROVIDER_SITE_OTHER)

## 2024-10-11 VITALS — BP 168/94

## 2024-10-11 DIAGNOSIS — Z0130 Encounter for examination of blood pressure without abnormal findings: Secondary | ICD-10-CM

## 2024-10-12 NOTE — Progress Notes (Signed)
 Medication reordered for patient as she stated she ran out.

## 2024-10-12 NOTE — Progress Notes (Signed)
 Pt was seen for a BP recheck. BP obtained and documented.

## 2024-10-18 ENCOUNTER — Ambulatory Visit (HOSPITAL_BASED_OUTPATIENT_CLINIC_OR_DEPARTMENT_OTHER)

## 2024-10-18 VITALS — BP 160/90

## 2024-10-18 DIAGNOSIS — I10 Essential (primary) hypertension: Secondary | ICD-10-CM

## 2024-10-18 NOTE — Progress Notes (Unsigned)
 Pt came in for BP recheck. BP obtained and documented.

## 2024-11-08 ENCOUNTER — Encounter (HOSPITAL_BASED_OUTPATIENT_CLINIC_OR_DEPARTMENT_OTHER): Payer: Self-pay | Admitting: Cardiology

## 2024-11-08 ENCOUNTER — Ambulatory Visit (INDEPENDENT_AMBULATORY_CARE_PROVIDER_SITE_OTHER): Admitting: Cardiology

## 2024-11-08 VITALS — BP 130/78 | HR 64 | Ht 62.0 in | Wt 229.4 lb

## 2024-11-08 DIAGNOSIS — I517 Cardiomegaly: Secondary | ICD-10-CM

## 2024-11-08 DIAGNOSIS — F17200 Nicotine dependence, unspecified, uncomplicated: Secondary | ICD-10-CM

## 2024-11-08 DIAGNOSIS — E7800 Pure hypercholesterolemia, unspecified: Secondary | ICD-10-CM | POA: Diagnosis not present

## 2024-11-08 DIAGNOSIS — Z720 Tobacco use: Secondary | ICD-10-CM | POA: Diagnosis not present

## 2024-11-08 DIAGNOSIS — E119 Type 2 diabetes mellitus without complications: Secondary | ICD-10-CM

## 2024-11-08 DIAGNOSIS — I700 Atherosclerosis of aorta: Secondary | ICD-10-CM | POA: Diagnosis not present

## 2024-11-08 DIAGNOSIS — Z716 Tobacco abuse counseling: Secondary | ICD-10-CM

## 2024-11-08 DIAGNOSIS — I10 Essential (primary) hypertension: Secondary | ICD-10-CM

## 2024-11-08 DIAGNOSIS — R600 Localized edema: Secondary | ICD-10-CM

## 2024-11-08 NOTE — Patient Instructions (Addendum)
 how to check blood pressure:  -sit comfortably in a chair, feet uncrossed and flat on floor, for 5-10 minutes  -arm ideally should rest at the level of the heart. However, arm should be relaxed and not tense (for example, do not hold the arm up unsupported)  -avoid exercise, caffeine, and tobacco for at least 30 minutes prior to BP reading  -don't take BP cuff reading over clothes (always place on skin directly)  -I prefer to know how well the medication is working, so I would like you to take your readings 1-2 hours after taking your blood pressure medication if possible     Medication Instructions:  No changes *If you need a refill on your cardiac medications before your next appointment, please call your pharmacy*  Lab Work: none  Testing/Procedures: Your physician has requested that you have an echocardiogram. Echocardiography is a painless test that uses sound waves to create images of your heart. It provides your doctor with information about the size and shape of your heart and how well your hearts chambers and valves are working. This procedure takes approximately one hour. There are no restrictions for this procedure. Please do NOT wear cologne, perfume, aftershave, or lotions (deodorant is allowed). Please arrive 15 minutes prior to your appointment time.  Please note: We ask at that you not bring children with you during ultrasound (echo/ vascular) testing. Due to room size and safety concerns, children are not allowed in the ultrasound rooms during exams. Our front office staff cannot provide observation of children in our lobby area while testing is being conducted. An adult accompanying a patient to their appointment will only be allowed in the ultrasound room at the discretion of the ultrasound technician under special circumstances. We apologize for any inconvenience.   Follow-Up: As needed based on results of testing

## 2024-11-08 NOTE — Progress Notes (Signed)
 " Cardiology Office Note:  .   Date:  11/08/2024  ID:  Dawn Lucas, DOB March 12, 1956, MRN [id number] PCP: Knute Thersia Bitters, FNP  [facility] Providers Cardiologist:  Shelda Bruckner, MD {  History of Present Illness: .   Dawn Lucas is a 69 y.o. female with PMH hypertension, sleep apnea, tobacco use, type II diabetes, fibromyalgia who is seen as a new patient evaluation at the request of Thersia Knute for cardiomegaly. She was remotely seen by Dr. Dann but has not seen cardiology since 2022.  Referral from 09/13/24 reviewed. Noted fatigue, shortness of breath, poor sleep. Had CT lung cancer screening that noted cardiomegaly. Referred to cardiology for further evaluation.  CV history: Echo in 2021 with EF 50-55%, mild LVH of basal septum, G1DD, GLD -14.9%. RV and RA pressures normal, no significant valve disease. Coronary CT 2022 with Ca score 0, no plaque or stenosis.  Today: Reviewed referral as noted above. Concern is for cardiomegaly on CT scan. We discussed this is more accurately assessed on echocardiogram, see below.  Doesn't check blood pressure at home. She isn't sure if she has a cuff still or not. She avoids salt on her food.  Patient notes rare chest pressure, she feels it is gas, rubs her chest and belches. Pain is mild, nonlimiting, not related to activity. Notes that some foods like greens seem to irritate her body more than others.   She is sleeping with CPAP, feels like she is sleeping longer now than she was before her CPAP was back. Feels like her breathing is a bit better, wheezing less at night. More shortness of breath when she is walking up the hill to the bus stop, this is helped when she uses her albuterol .  Has chronic LE edema in her ankles, worse at the end of the day, better in the morning. Also swells when she is having pain/inflammation.  FH: mother died of colon cancer. She knows that there are some heart problems in her family, but  she isn't sure who/what were the specifics.  ROS: Denies shortness of breath at rest. No PND, orthopnea, change in LE edema or unexpected weight gain. No syncope or palpitations. ROS otherwise negative except as noted.   Studies Reviewed: SABRA    EKG:  EKG Interpretation Date/Time:  Monday November 08 2024 13:48:58 EST Ventricular Rate:  63 PR Interval:  152 QRS Duration:  80 QT Interval:  378 QTC Calculation: 386 R Axis:   -37  Text Interpretation: Normal sinus rhythm Left axis deviation Minimal voltage criteria for LVH, may be normal variant Nonspecific T wave abnormality Confirmed by Bruckner Shelda [phone]) on 11/08/2024 1:53:16 PM    Physical Exam:   VS:  BP 130/78 (Cuff Size: Large)   Pulse 64   Ht 5' 2 (1.575 m)   Wt 229 lb 6.4 oz (104.1 kg)   SpO2 93%   BMI 41.96 kg/m    Wt Readings from Last 3 Encounters:  11/08/24 229 lb 6.4 oz (104.1 kg)  09/21/24 223 lb (101.2 kg)  09/13/24 227 lb (103 kg)    GEN: Well nourished, well developed in no acute distress HEENT: Normal, moist mucous membranes NECK: No JVD CARDIAC: regular rhythm, normal S1 and S2, no rubs or gallops. No murmur. VASCULAR: Radial and DP pulses 2+ bilaterally. No carotid bruits RESPIRATORY:  Clear to auscultation without rales, wheezing or rhonchi  ABDOMEN: Soft, non-tender, non-distended MUSCULOSKELETAL:  Ambulates independently SKIN: Warm and dry, no edema NEUROLOGIC:  Alert and oriented x 3. No focal neuro deficits noted. PSYCHIATRIC:  Normal affect    ASSESSMENT AND PLAN: .    Concern for cardiomegaly -reviewed that CT imaging is often not as accurate for measuring heart size. Reviewed prior reassuring echo in 2021, but it has been 5 years. With history of hypertension and LE edema, discussed rechecking echo to make sure that there is no LVH or chamber dilation -borderline LVH on ECG, and has history of hypertension (on review of chart, office readings at least are typically elevated)  Aortic  atherosclerosis Type II diabetes Hypercholesterolemia Obesity, BMI ~42 -on atorvastatin  20 mg daily. Prior calcium  score was 0, but with diabetes and aortic atherosclerosis agree with statin use -last LDL 84 -discussed lifestyle recommendations today  Hypertension Intermittent LE edema -on furosemide  40 mg daily, losartan  50 mg daily -doesn't have home cuff, discussed that this may help us  more accurately manage her blood pressures -given instructions on how to check home blood pressures  Tobacco use: The patient was counseled on tobacco cessation today for 4 minutes.  Counseling included reviewing the risks of smoking tobacco products, how it impacts the patient's current medical diagnoses and different strategies for quitting.  Pharmacotherapy to aid in tobacco cessation was not prescribed today.   Dispo: if echocardiogram unremarkable, I would be happy to see her back as needed  Signed, Shelda Bruckner, MD   Shelda Bruckner, MD, PhD, [facility]  [facility]  [facility] at [facility] [address] [place], [place] [address] [phone]   "

## 2024-11-09 ENCOUNTER — Encounter (HOSPITAL_BASED_OUTPATIENT_CLINIC_OR_DEPARTMENT_OTHER): Payer: Self-pay

## 2024-11-09 ENCOUNTER — Ambulatory Visit (INDEPENDENT_AMBULATORY_CARE_PROVIDER_SITE_OTHER)

## 2024-11-09 ENCOUNTER — Ambulatory Visit (HOSPITAL_BASED_OUTPATIENT_CLINIC_OR_DEPARTMENT_OTHER)

## 2024-11-09 VITALS — BP 167/93 | HR 68 | Ht 62.0 in | Wt 228.0 lb

## 2024-11-09 DIAGNOSIS — J449 Chronic obstructive pulmonary disease, unspecified: Secondary | ICD-10-CM | POA: Diagnosis not present

## 2024-11-09 DIAGNOSIS — G4733 Obstructive sleep apnea (adult) (pediatric): Secondary | ICD-10-CM | POA: Diagnosis not present

## 2024-11-09 DIAGNOSIS — R911 Solitary pulmonary nodule: Secondary | ICD-10-CM

## 2024-11-09 DIAGNOSIS — F17210 Nicotine dependence, cigarettes, uncomplicated: Secondary | ICD-10-CM

## 2024-11-09 MED ORDER — BUDESONIDE-FORMOTEROL FUMARATE 160-4.5 MCG/ACT IN AERO: 2.0000 | INHALATION_SPRAY | Freq: Two times a day (BID) | RESPIRATORY_TRACT | 6 refills | Status: AC

## 2024-11-09 NOTE — Patient Instructions (Signed)
 Full PFT performed today.

## 2024-11-09 NOTE — Progress Notes (Signed)
 Full PFT performed today.

## 2024-12-08 ENCOUNTER — Other Ambulatory Visit (HOSPITAL_BASED_OUTPATIENT_CLINIC_OR_DEPARTMENT_OTHER)

## 2025-03-09 ENCOUNTER — Ambulatory Visit (HOSPITAL_BASED_OUTPATIENT_CLINIC_OR_DEPARTMENT_OTHER)

## 2025-03-14 ENCOUNTER — Ambulatory Visit (HOSPITAL_BASED_OUTPATIENT_CLINIC_OR_DEPARTMENT_OTHER): Admitting: Family Medicine
